# Patient Record
Sex: Male | Born: 1955 | Race: White | Hispanic: No | Marital: Single
Health system: Southern US, Community
[De-identification: ages and names within clinical notes are randomized; demographics above are authoritative.]

## PROBLEM LIST (undated history)

## (undated) DIAGNOSIS — R0683 Snoring: Secondary | ICD-10-CM

## (undated) DIAGNOSIS — K219 Gastro-esophageal reflux disease without esophagitis: Secondary | ICD-10-CM

## (undated) DIAGNOSIS — R9430 Abnormal result of cardiovascular function study, unspecified: Secondary | ICD-10-CM

## (undated) DIAGNOSIS — I5189 Other ill-defined heart diseases: Secondary | ICD-10-CM

## (undated) DIAGNOSIS — I119 Hypertensive heart disease without heart failure: Secondary | ICD-10-CM

## (undated) DIAGNOSIS — R0600 Dyspnea, unspecified: Secondary | ICD-10-CM

## (undated) DIAGNOSIS — M1990 Unspecified osteoarthritis, unspecified site: Secondary | ICD-10-CM

## (undated) DIAGNOSIS — Z9289 Personal history of other medical treatment: Secondary | ICD-10-CM

## (undated) DIAGNOSIS — F411 Generalized anxiety disorder: Secondary | ICD-10-CM

## (undated) DIAGNOSIS — T7840XA Allergy, unspecified, initial encounter: Secondary | ICD-10-CM

## (undated) DIAGNOSIS — I10 Essential (primary) hypertension: Secondary | ICD-10-CM

## (undated) DIAGNOSIS — H8110 Benign paroxysmal vertigo, unspecified ear: Secondary | ICD-10-CM

## (undated) HISTORY — DX: Snoring: R06.83

## (undated) HISTORY — DX: Benign paroxysmal vertigo, unspecified ear: H81.10

## (undated) HISTORY — DX: Dyspnea, unspecified: R06.00

## (undated) HISTORY — DX: Abnormal result of cardiovascular function study, unspecified: R94.30

## (undated) HISTORY — DX: Hypertensive heart disease without heart failure: I11.9

## (undated) HISTORY — DX: Personal history of other medical treatment: Z92.89

## (undated) HISTORY — DX: Essential (primary) hypertension: I10

## (undated) HISTORY — DX: Unspecified osteoarthritis, unspecified site: M19.90

## (undated) HISTORY — DX: Allergy, unspecified, initial encounter: T78.40XA

## (undated) HISTORY — DX: Gastro-esophageal reflux disease without esophagitis: K21.9

## (undated) HISTORY — DX: Generalized anxiety disorder: F41.1

## (undated) HISTORY — DX: Other ill-defined heart diseases: I51.89

---

## 2005-03-14 ENCOUNTER — Inpatient Hospital Stay (HOSPITAL_COMMUNITY): Admission: AD | Admit: 2005-03-14 | Discharge: 2005-03-17 | Payer: Self-pay | Admitting: Internal Medicine

## 2005-03-16 ENCOUNTER — Encounter (INDEPENDENT_AMBULATORY_CARE_PROVIDER_SITE_OTHER): Payer: Self-pay | Admitting: *Deleted

## 2005-05-22 ENCOUNTER — Ambulatory Visit (HOSPITAL_COMMUNITY): Admission: RE | Admit: 2005-05-22 | Discharge: 2005-05-22 | Payer: Self-pay | Admitting: *Deleted

## 2007-04-10 ENCOUNTER — Encounter (HOSPITAL_BASED_OUTPATIENT_CLINIC_OR_DEPARTMENT_OTHER): Admission: RE | Admit: 2007-04-10 | Discharge: 2007-04-16 | Payer: Self-pay | Admitting: Surgery
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
## 2011-03-02 NOTE — Op Note (Signed)
[name], [name] NO.:  [id number]   MEDICAL RECORD NO.:  [id number]          PATIENT TYPE:  INP   LOCATION:  5733                         FACILITY:  [facility]   PHYSICIAN:  Georgiana Spinner, M.D.    DATE OF BIRTH:  [birth year]   DATE OF PROCEDURE:  03/16/2005  DATE OF DISCHARGE:                                 OPERATIVE REPORT   PROCEDURE:  Upper endoscopy with biopsy.   INDICATIONS FOR PROCEDURE:  GI bleed.   ANESTHESIA:  Demerol 60, Versed 6 mg.   PROCEDURE:  With the patient mildly sedated in the left lateral decubitus  position, the Olympus videoscopic endoscope was inserted in the mouth and  passed under direct vision through the esophagus which normal until we  reached the distal esophagus and there was the question of a small ulcer and  esophagitis photographed and biopsied.  We entered into the stomach.  The  fundus, body, antrum were visualized.  We then entered into the duodenal  bulb and second portion of the duodenum which all appeared normal.  From  this point, the endoscope was slowly withdrawn taking circumferential views  of the duodenal mucosa until the endoscope was pulled back into the stomach  and placed in retroflexion to view the stomach from below.  The endoscope  was then straightened and withdrawn, taking circumferential views of the  remaining gastric and esophageal mucosa.  The patient's vital signs and  pulse oximeter remained stable.  The patient tolerated the procedure well  without apparent complications.   FINDINGS:  Unremarkable examination other than some possible mild gastritis  which was biopsied and the mild esophagitis which was biopsied.   PLAN:  Await biopsy report, the patient will call me for results and follow  up with me as an outpatient.      GMO/MEDQ  D:  03/16/2005  T:  03/16/2005  Job:  [id number]
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
## 2013-03-17 ENCOUNTER — Encounter: Payer: Self-pay | Admitting: Internal Medicine

## 2013-03-17 ENCOUNTER — Other Ambulatory Visit (INDEPENDENT_AMBULATORY_CARE_PROVIDER_SITE_OTHER): Payer: BC Managed Care – PPO

## 2013-03-17 ENCOUNTER — Ambulatory Visit (INDEPENDENT_AMBULATORY_CARE_PROVIDER_SITE_OTHER): Payer: BC Managed Care – PPO | Admitting: Internal Medicine

## 2013-03-17 VITALS — BP 140/80 | HR 73 | Temp 98.5°F | Resp 16 | Ht 67.5 in | Wt 186.0 lb

## 2013-03-17 DIAGNOSIS — Z0000 Encounter for general adult medical examination without abnormal findings: Secondary | ICD-10-CM

## 2013-03-17 DIAGNOSIS — R05 Cough: Secondary | ICD-10-CM

## 2013-03-17 DIAGNOSIS — J42 Unspecified chronic bronchitis: Secondary | ICD-10-CM

## 2013-03-17 DIAGNOSIS — Z136 Encounter for screening for cardiovascular disorders: Secondary | ICD-10-CM

## 2013-03-17 DIAGNOSIS — F411 Generalized anxiety disorder: Secondary | ICD-10-CM

## 2013-03-17 DIAGNOSIS — I10 Essential (primary) hypertension: Secondary | ICD-10-CM

## 2013-03-17 DIAGNOSIS — R0683 Snoring: Secondary | ICD-10-CM

## 2013-03-17 DIAGNOSIS — R0609 Other forms of dyspnea: Secondary | ICD-10-CM

## 2013-03-17 HISTORY — DX: Essential (primary) hypertension: I10

## 2013-03-17 HISTORY — DX: Generalized anxiety disorder: F41.1

## 2013-03-17 HISTORY — DX: Snoring: R06.83

## 2013-03-17 LAB — HEPATITIS C ANTIBODY: HCV Ab: NEGATIVE

## 2013-03-17 LAB — CBC WITH DIFFERENTIAL/PLATELET
Basophils Absolute: 0.1 10*3/uL (ref 0.0–0.1)
Basophils Relative: 1.6 % (ref 0.0–3.0)
Hemoglobin: 13.2 g/dL (ref 13.0–17.0)
Lymphs Abs: 2 10*3/uL (ref 0.7–4.0)
MCV: 97.4 fl (ref 78.0–100.0)
Monocytes Absolute: 0.6 10*3/uL (ref 0.1–1.0)
Neutro Abs: 5 10*3/uL (ref 1.4–7.7)
RBC: 4.02 Mil/uL — ABNORMAL LOW (ref 4.22–5.81)

## 2013-03-17 LAB — URINALYSIS, ROUTINE W REFLEX MICROSCOPIC
Bilirubin Urine: NEGATIVE
Leukocytes, UA: NEGATIVE
Nitrite: NEGATIVE
Specific Gravity, Urine: 1.02 (ref 1.000–1.030)
Urobilinogen, UA: 0.2 (ref 0.0–1.0)
pH: 5.5 (ref 5.0–8.0)

## 2013-03-17 LAB — COMPREHENSIVE METABOLIC PANEL
ALT: 9 U/L (ref 0–53)
Albumin: 3.7 g/dL (ref 3.5–5.2)
BUN: 20 mg/dL (ref 6–23)
GFR: 47.26 mL/min — ABNORMAL LOW (ref >60.00)
Glucose, Bld: 94 mg/dL (ref 70–99)

## 2013-03-17 LAB — LIPID PANEL
LDL Cholesterol: 121 mg/dL — ABNORMAL HIGH (ref 0–99)
Triglycerides: 140 mg/dL (ref 0.0–149.0)

## 2013-03-17 MED ORDER — OLMESARTAN MEDOXOMIL-HCTZ 20-12.5 MG PO TABS: 1.0000 | ORAL_TABLET | Freq: Every day | ORAL | Status: DC

## 2013-03-17 MED ORDER — ALPRAZOLAM 0.5 MG PO TABS: 0.5000 mg | ORAL_TABLET | Freq: Two times a day (BID) | ORAL | Status: DC | PRN

## 2013-03-17 NOTE — Assessment & Plan Note (Signed)
Stop the ACEI Start Benicar-HCT I will check his labs to look for end organ damage and secondary causes of HTN

## 2013-03-17 NOTE — Assessment & Plan Note (Signed)
Exam done, EKG shows RBBB (not significant)  Vaccines were updated Labs ordered He was referred for a colonoscopy Pt ed material was given

## 2013-03-17 NOTE — Assessment & Plan Note (Signed)
Cont xanax as needed 

## 2013-03-17 NOTE — Progress Notes (Signed)
Subjective:    Patient ID: [name], male    DOB: [birth year], 57 y.o.   MRN: [id number]  Cough This is a chronic problem. The current episode started more than 1 year ago. The problem has been unchanged. The problem occurs constantly. The cough is productive of sputum (white scant phlegm). Associated symptoms include shortness of breath and wheezing. Pertinent negatives include no chest pain, chills, ear congestion, ear pain, fever, headaches, heartburn, hemoptysis, myalgias, nasal congestion, postnasal drip, rash, rhinorrhea, sore throat, sweats or weight loss. Nothing aggravates the symptoms. He has tried nothing for the symptoms.      Review of Systems  Constitutional: Negative.  Negative for fever, chills, weight loss, diaphoresis, activity change, appetite change, fatigue and unexpected weight change.  HENT: Negative.  Negative for ear pain, sore throat, rhinorrhea and postnasal drip.   Eyes: Negative.   Respiratory: Positive for cough, shortness of breath and wheezing. Negative for apnea, hemoptysis, choking, chest tightness and stridor.        "heavy snoring"  Cardiovascular: Negative.  Negative for chest pain, palpitations and leg swelling.  Gastrointestinal: Negative.  Negative for heartburn, nausea, vomiting, abdominal pain, diarrhea, constipation, abdominal distention, anal bleeding and rectal pain.  Endocrine: Negative.   Genitourinary: Negative.   Musculoskeletal: Negative.  Negative for myalgias.  Skin: Negative.  Negative for color change, pallor, rash and wound.  Allergic/Immunologic: Negative.   Neurological: Negative.  Negative for dizziness, weakness, light-headedness and headaches.  Hematological: Negative.  Negative for adenopathy. Does not bruise/bleed easily.  Psychiatric/Behavioral: Positive for sleep disturbance. Negative for suicidal ideas and self-injury. The patient is nervous/anxious.        Objective:   Physical Exam  Vitals reviewed. Constitutional:  He is oriented to person, place, and time. He appears well-developed and well-nourished. No distress.  HENT:  Head: Normocephalic and atraumatic.  Mouth/Throat: Oropharynx is clear and moist. No oropharyngeal exudate.  Eyes: Conjunctivae are normal. Right eye exhibits no discharge. Left eye exhibits no discharge. No scleral icterus.  Neck: Normal range of motion. Neck supple. No JVD present. No tracheal deviation present. No thyromegaly present.  Cardiovascular: Normal rate, regular rhythm, normal heart sounds and intact distal pulses.  Exam reveals no gallop and no friction rub.   No murmur heard. Pulmonary/Chest: Effort normal and breath sounds normal. No stridor. No respiratory distress. He has no wheezes. He has no rales. He exhibits no tenderness.  Abdominal: Soft. Bowel sounds are normal. He exhibits no distension and no mass. There is no tenderness. There is no rebound and no guarding. Hernia confirmed negative in the right inguinal area and confirmed negative in the left inguinal area.  Genitourinary: Rectum normal, prostate normal, testes normal and penis normal. Rectal exam shows no external hemorrhoid, no internal hemorrhoid, no fissure, no mass, no tenderness and anal tone normal. Guaiac negative stool. Prostate is not enlarged and not tender. Right testis shows no mass, no swelling and no tenderness. Right testis is descended. Left testis shows no mass, no swelling and no tenderness. Left testis is descended. Uncircumcised. No phimosis, paraphimosis, hypospadias, penile erythema or penile tenderness. No discharge found.  Musculoskeletal: Normal range of motion. He exhibits no edema and no tenderness.  Lymphadenopathy:    He has no cervical adenopathy.       Right: No inguinal adenopathy present.       Left: No inguinal adenopathy present.  Neurological: He is oriented to person, place, and time.  Skin: Skin is warm and dry. No rash noted.  He is not diaphoretic. No erythema. No pallor.   Psychiatric: He has a normal mood and affect. His behavior is normal. Judgment and thought content normal.     No results found for this basename: WBC, HGB, HCT, PLT, GLUCOSE, CHOL, TRIG, HDL, LDLDIRECT, LDLCALC, ALT, AST, NA, K, CL, CREATININE, BUN, CO2, TSH, PSA, INR, GLUF, HGBA1C, MICROALBUR       Assessment & Plan:

## 2013-03-17 NOTE — Patient Instructions (Signed)
Cough, Adult  A cough is a reflex that helps clear your throat and airways. It can help heal the body or may be a reaction to an irritated airway. A cough may only last 2 or 3 weeks (acute) or may last more than 8 weeks (chronic).  CAUSES Acute cough:  Viral or bacterial infections. Chronic cough:  Infections.  Allergies.  Asthma.  Post-nasal drip.  Smoking.  Heartburn or acid reflux.  Some medicines.  Chronic lung problems (COPD).  Cancer. SYMPTOMS   Cough.  Fever.  Chest pain.  Increased breathing rate.  High-pitched whistling sound when breathing (wheezing).  Colored mucus that you cough up (sputum). TREATMENT   A bacterial cough may be treated with antibiotic medicine.  A viral cough must run its course and will not respond to antibiotics.  Your caregiver may recommend other treatments if you have a chronic cough. HOME CARE INSTRUCTIONS   Only take over-the-counter or prescription medicines for pain, discomfort, or fever as directed by your caregiver. Use cough suppressants only as directed by your caregiver.  Use a cold steam vaporizer or humidifier in your bedroom or home to help loosen secretions.  Sleep in a semi-upright position if your cough is worse at night.  Rest as needed.  Stop smoking if you smoke. SEEK IMMEDIATE MEDICAL CARE IF:   You have pus in your sputum.  Your cough starts to worsen.  You cannot control your cough with suppressants and are losing sleep.  You begin coughing up blood.  You have difficulty breathing.  You develop pain which is getting worse or is uncontrolled with medicine.  You have a fever. MAKE SURE YOU:   Understand these instructions.  Will watch your condition.  Will get help right away if you are not doing well or get worse. Document Released: 03/30/2011 Document Revised: 12/24/2011 Document Reviewed: 03/30/2011 [facility] Patient Information 2014 [place], [place]. Health Maintenance, Males A  healthy lifestyle and preventative care can promote health and wellness.  Maintain regular health, dental, and eye exams.  Eat a healthy diet. Foods like vegetables, fruits, whole grains, low-fat dairy products, and lean protein foods contain the nutrients you need without too many calories. Decrease your intake of foods high in solid fats, added sugars, and salt. Get information about a proper diet from your caregiver, if necessary.  Regular physical exercise is one of the most important things you can do for your health. Most adults should get at least 150 minutes of moderate-intensity exercise (any activity that increases your heart rate and causes you to sweat) each week. In addition, most adults need muscle-strengthening exercises on 2 or more days a week.   Maintain a healthy weight. The body mass index (BMI) is a screening tool to identify possible weight problems. It provides an estimate of body fat based on height and weight. Your caregiver can help determine your BMI, and can help you achieve or maintain a healthy weight. For adults 20 years and older:  A BMI below 18.5 is considered underweight.  A BMI of 18.5 to 24.9 is normal.  A BMI of 25 to 29.9 is considered overweight.  A BMI of 30 and above is considered obese.  Maintain normal blood lipids and cholesterol by exercising and minimizing your intake of saturated fat. Eat a balanced diet with plenty of fruits and vegetables. Blood tests for lipids and cholesterol should begin at age 51 and be repeated every 5 years. If your lipid or cholesterol levels are high, you are  over 50, or you are a high risk for heart disease, you may need your cholesterol levels checked more frequently.Ongoing high lipid and cholesterol levels should be treated with medicines, if diet and exercise are not effective.  If you smoke, find out from your caregiver how to quit. If you do not use tobacco, do not start.  If you choose to drink alcohol, do not  exceed 2 drinks per day. One drink is considered to be 12 ounces (355 mL) of beer, 5 ounces (148 mL) of wine, or 1.5 ounces (44 mL) of liquor.  Avoid use of street drugs. Do not share needles with anyone. Ask for help if you need support or instructions about stopping the use of drugs.  High blood pressure causes heart disease and increases the risk of stroke. Blood pressure should be checked at least every 1 to 2 years. Ongoing high blood pressure should be treated with medicines if weight loss and exercise are not effective.  If you are 29 to 57 years old, ask your caregiver if you should take aspirin to prevent heart disease.  Diabetes screening involves taking a blood sample to check your fasting blood sugar level. This should be done once every 3 years, after age 47, if you are within normal weight and without risk factors for diabetes. Testing should be considered at a younger age or be carried out more frequently if you are overweight and have at least 1 risk factor for diabetes.  Colorectal cancer can be detected and often prevented. Most routine colorectal cancer screening begins at the age of 66 and continues through age 53. However, your caregiver may recommend screening at an earlier age if you have risk factors for colon cancer. On a yearly basis, your caregiver may provide home test kits to check for hidden blood in the stool. Use of a small camera at the end of a tube, to directly examine the colon (sigmoidoscopy or colonoscopy), can detect the earliest forms of colorectal cancer. Talk to your caregiver about this at age 9, when routine screening begins. Direct examination of the colon should be repeated every 5 to 10 years through age 52, unless early forms of pre-cancerous polyps or small growths are found.  Hepatitis C blood testing is recommended for all people born from [birth year] through 1965 and any individual with known risks for hepatitis C.  Healthy men should no longer receive  prostate-specific antigen (PSA) blood tests as part of routine cancer screening. Consult with your caregiver about prostate cancer screening.  Testicular cancer screening is not recommended for adolescents or adult males who have no symptoms. Screening includes self-exam, caregiver exam, and other screening tests. Consult with your caregiver about any symptoms you have or any concerns you have about testicular cancer.  Practice safe sex. Use condoms and avoid high-risk sexual practices to reduce the spread of sexually transmitted infections (STIs).  Use sunscreen with a sun protection factor (SPF) of 30 or greater. Apply sunscreen liberally and repeatedly throughout the day. You should seek shade when your shadow is shorter than you. Protect yourself by wearing long sleeves, pants, a wide-brimmed hat, and sunglasses year round, whenever you are outdoors.  Notify your caregiver of new moles or changes in moles, especially if there is a change in shape or color. Also notify your caregiver if a mole is larger than the size of a pencil eraser.  A one-time screening for abdominal aortic aneurysm (AAA) and surgical repair of large AAAs by sound wave  imaging (ultrasonography) is recommended for ages 32 to 90 years who are current or former smokers.  Stay current with your immunizations. Document Released: 03/29/2008 Document Revised: 12/24/2011 Document Reviewed: 02/26/2011 [facility] Patient Information 2014 [place], [place].

## 2013-03-17 NOTE — Assessment & Plan Note (Signed)
I asked him to stop smoking He will see pulm to consider getting PFT's done

## 2013-03-17 NOTE — Assessment & Plan Note (Signed)
Stop the ACEI Check a CXR pulm referral

## 2013-03-17 NOTE — Assessment & Plan Note (Signed)
pulm referral

## 2013-03-18 ENCOUNTER — Encounter: Payer: Self-pay | Admitting: Internal Medicine

## 2013-03-18 ENCOUNTER — Ambulatory Visit (INDEPENDENT_AMBULATORY_CARE_PROVIDER_SITE_OTHER)
Admission: RE | Admit: 2013-03-18 | Discharge: 2013-03-18 | Disposition: A | Discharge status: Home / Self Care | Payer: BC Managed Care – PPO | Source: Ambulatory Visit | Attending: Internal Medicine | Admitting: Internal Medicine

## 2013-03-18 ENCOUNTER — Ambulatory Visit (INDEPENDENT_AMBULATORY_CARE_PROVIDER_SITE_OTHER): Payer: BC Managed Care – PPO | Admitting: Internal Medicine

## 2013-03-18 VITALS — BP 130/78 | HR 77 | Temp 98.0°F | Ht 64.25 in | Wt 187.0 lb

## 2013-03-18 DIAGNOSIS — R05 Cough: Secondary | ICD-10-CM

## 2013-03-18 DIAGNOSIS — F17200 Nicotine dependence, unspecified, uncomplicated: Secondary | ICD-10-CM | POA: Insufficient documentation

## 2013-03-18 MED ORDER — TRAMADOL HCL 50 MG PO TABS: ORAL_TABLET | ORAL | Status: DC

## 2013-03-18 NOTE — Progress Notes (Signed)
  Subjective:    Patient ID: [name], male    DOB: 09/05/1956   MRN: [id number]  HPI  68 yowm smoker with cough due to tickle in throat x 2011 referred 03/18/2013 to pulmonary clinic for eval of chronic cough / sob.  03/18/2013 1st pulmonary eval cc sev years indolent onset persistent daily x several years not really getting any worse sp discontinuation of acei 03/17/13 by Dr Yetta Barre. Assoc with mild sense of  Doe/ pnds/nasal congestion/dysphagia.  No obvious daytime variabilty or assoc   cp or chest tightness, subjective wheeze overt sinus or hb symptoms. No unusual exp hx or h/o childhood pna/ asthma or premature birth to his knowledge.   Sleeping ok without nocturnal  or early am exacerbation  of respiratory  c/o's or need for noct saba. Also denies any obvious fluctuation of symptoms with weather or environmental changes or other aggravating or alleviating factors except as outlined above    Review of Systems  Constitutional: Negative for fever, chills, activity change, appetite change and unexpected weight change.  HENT: Negative for congestion, sore throat, rhinorrhea, sneezing, trouble swallowing, dental problem, voice change and postnasal drip.   Eyes: Negative for visual disturbance.  Respiratory: Positive for cough and shortness of breath. Negative for choking.   Cardiovascular: Negative for chest pain and leg swelling.  Gastrointestinal: Negative for nausea, vomiting and abdominal pain.  Genitourinary: Negative for difficulty urinating.       Acid Heartburn  Musculoskeletal: Positive for arthralgias.  Skin: Negative for rash.  Psychiatric/Behavioral: Negative for behavioral problems and confusion.       Objective:   Physical Exam  Wt Readings from Last 3 Encounters:  03/18/13 187 lb (84.823 kg)  03/17/13 186 lb (84.369 kg)    amb wm with mod hoarseness, mild pseudowheeze nad with nl vital signs HEENT mild turbinate edema.  Oropharynx no thrush or excess pnd or  cobblestoning.  No JVD or cervical adenopathy. Mild accessory muscle hypertrophy. Trachea midline, nl thryroid. Chest was ,min hyperinflated by percussion with slt diminished breath sounds and min increased exp time without wheeze. Hoover sign positive at late  inspiration. Regular rate and rhythm without murmur gallop or rub or increase P2 or edema.  Abd: no hsm, nl excursion. Ext warm without cyanosis or clubbing.      CXR  03/18/2013 :   No acute findings.      Assessment & Plan:

## 2013-03-18 NOTE — Assessment & Plan Note (Signed)
I took an extended  opportunity with this patient to outline the consequences of continued cigarette use  in airway disorders based on all the data we have from the multiple national lung health studies (perfomed over decades at millions of dollars in cost)  indicating that smoking cessation, not choice of inhalers or physicians, is the most important aspect of care.    Needs baseline pfts rec on f/u in 6 weeks to regroup re ? copd

## 2013-03-18 NOTE — Patient Instructions (Addendum)
Please remember to go to the x-ray department downstairs for your tests - we will call you with the results when they are available.  GERD (REFLUX)  is an extremely common cause of respiratory symptoms, many times with no significant heartburn at all.    It can be treated with medication, but also with lifestyle changes including avoidance of late meals, excessive alcohol, smoking cessation, and avoid fatty foods, chocolate, peppermint, colas, red wine, and acidic juices such as orange juice.  NO MINT OR MENTHOL PRODUCTS SO NO COUGH DROPS  USE SUGARLESS CANDY INSTEAD (jolley ranchers or Stover's)  NO OIL BASED VITAMINS - use powdered substitutes.  For cough delsym 2tsp every 12 hours supplement with tramadol 50 mg up to 2 every 4 hours but may make you sleepy - you should gradually need less cough suppression over the next several weeks as the lisinopril washes out  Please schedule a follow up office visit in 6 weeks, call sooner if needed with pfts on return

## 2013-03-18 NOTE — Assessment & Plan Note (Signed)
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   These are the same pts (now being labeled as having "irritable larynx syndrome" by some cough centers) who not infrequently have a history of having failed to tolerate ace inhibitors,  dry powder inhalers or biphosphonates or report having atypical reflux symptoms that don't respond to standard doses of PPI , and are easily confused as having aecopd or asthma flares by even experienced allergists/ pulmonologists.   For now rec continue rx for gerd off acei and return for pft's

## 2013-03-19 ENCOUNTER — Telehealth: Payer: Self-pay | Admitting: *Deleted

## 2013-03-19 NOTE — Telephone Encounter (Signed)
Left msg on triage wanting to verify what type of paper our prescription are printed on. Received a script for alprazolam.../lmb

## 2013-03-19 NOTE — Telephone Encounter (Signed)
Called [facility] back spoke with pharmacist Sameer he stated there wasn't any note there for pt. Not sure who called...[name]

## 2013-04-29 ENCOUNTER — Encounter: Payer: Self-pay | Admitting: Internal Medicine

## 2013-04-29 ENCOUNTER — Ambulatory Visit (INDEPENDENT_AMBULATORY_CARE_PROVIDER_SITE_OTHER): Payer: BC Managed Care – PPO | Admitting: Internal Medicine

## 2013-04-29 VITALS — BP 120/70 | HR 71 | Temp 98.1°F | Ht 66.0 in | Wt 189.0 lb

## 2013-04-29 DIAGNOSIS — I10 Essential (primary) hypertension: Secondary | ICD-10-CM

## 2013-04-29 DIAGNOSIS — F17200 Nicotine dependence, unspecified, uncomplicated: Secondary | ICD-10-CM

## 2013-04-29 DIAGNOSIS — R05 Cough: Secondary | ICD-10-CM

## 2013-04-29 LAB — PULMONARY FUNCTION TEST

## 2013-04-29 MED ORDER — OLMESARTAN MEDOXOMIL-HCTZ 20-12.5 MG PO TABS: ORAL_TABLET | ORAL | Status: DC

## 2013-04-29 NOTE — Patient Instructions (Addendum)
Continue benicar 20/12.5 one daily Continue prilosec 40 mg Take 30- 60 min before your first and last meals of the day  Take one more round of prednisone should clear the inflammation from the cough itself  GERD (REFLUX)  is an extremely common cause of respiratory symptoms, many times with no significant heartburn at all.    It can be treated with medication, but also with lifestyle changes including avoidance of late meals, excessive alcohol, smoking cessation, and avoid fatty foods, chocolate, peppermint, colas, red wine, and acidic juices such as orange juice.  NO MINT OR MENTHOL PRODUCTS SO NO COUGH DROPS  USE SUGARLESS CANDY INSTEAD (jolley ranchers or Stover's)  NO OIL BASED VITAMINS - use powdered substitutes.     If you are satisfied with your treatment plan let your doctor know and he/she can either refill your medications or you can return here when your prescription runs out.     If in any way you are not 100% satisfied,  please tell us.  If 100% better, tell your friends!

## 2013-04-29 NOTE — Progress Notes (Signed)
  Subjective:    Patient ID: [name], male    DOB: [birth year]   MRN: [id number]  Brief patient profile:  39 yowm smoker with cough due to tickle in throat x 2011 referred 03/18/2013 to pulmonary clinic for eval of chronic cough / sob.  03/18/2013 1st pulmonary eval cc sev years indolent onset persistent daily cough x several years not really getting any worse over time,  sp discontinuation of acei 03/17/13 by Dr Yetta Barre and no better yet. Assoc with mild sense of  Doe/ pnds/nasal congestion/dysphagia.  rec  GERD diet  For cough delsym 2tsp every 12 hours supplement with tramadol 50 mg up to 2 every 4 hours but may make you sleepy - you should gradually need less cough suppression over the next several weeks as the lisinopril washes out  04/29/2013 f/u ov/[name]  Chief Complaint  Patient presents with  . Follow-up with PFT    Pt states cough has improved some since his last visit. He c/o sneezing and lack of smell and taste for the past wk.     No obvious daytime variabilty or assoc sob   cp or chest tightness, subjective wheeze overt  hb symptoms. No unusual exp hx or h/o childhood pna/ asthma or premature birth to his knowledge.   Sleeping ok without nocturnal  or early am exacerbation  of respiratory  c/o's or need for noct saba. Also denies any obvious fluctuation of symptoms with weather or environmental changes or other aggravating or alleviating factors except as outlined above   Current Medications, Allergies, Past Medical History, Past Surgical History, Family History, and Social History were reviewed in [employer] record.  ROS  The following are not active complaints unless bolded sore throat, dysphagia, dental problems, itching, sneezing,  nasal congestion or excess/ purulent secretions, ear ache,   fever, chills, sweats, unintended wt loss, pleuritic or exertional cp, hemoptysis,  orthopnea pnd or leg swelling, presyncope, palpitations, heartburn, abdominal pain,  anorexia, nausea, vomiting, diarrhea  or change in bowel or urinary habits, change in stools or urine, dysuria,hematuria,  rash, arthralgias, visual complaints, headache, numbness weakness or ataxia or problems with walking or coordination,  change in mood/affect or memory.              Objective:   Physical Exam Wt Readings from Last 3 Encounters:  04/29/13 189 lb (85.73 kg)  03/18/13 187 lb (84.823 kg)  03/17/13 186 lb (84.369 kg)      amb wm with mild hoarseness,  no pseudowheeze nad with nl vital signs HEENT mild turbinate edema.  Oropharynx no thrush or excess pnd or cobblestoning.  No JVD or cervical adenopathy. Mild accessory muscle hypertrophy. Trachea midline, nl thryroid. Chest was ,min hyperinflated by percussion with slt diminished breath sounds and min increased exp time without wheeze. Hoover sign positive at late  inspiration. Regular rate and rhythm without murmur gallop or rub or increase P2 or edema.  Abd: no hsm, nl excursion. Ext warm without cyanosis or clubbing.      CXR  03/18/2013 :   No acute findings.      Assessment & Plan:

## 2013-04-29 NOTE — Progress Notes (Signed)
PFT done today. 

## 2013-05-01 NOTE — Assessment & Plan Note (Signed)
He does not meet the criteria for copd yet  I reviewed the Flethcher curve with patient that basically indicates  if you quit smoking when your best day FEV1 is still well preserved (which his is) it is highly unlikely you will progress to severe disease and informed the patient there was no medication on the market that has proven to change the curve or the likelihood of progression.  Therefore stopping smoking and maintaining abstinence is the most important aspect of care, not choice of inhalers or for that matter, doctors.

## 2013-07-03 ENCOUNTER — Telehealth: Payer: Self-pay

## 2013-07-03 DIAGNOSIS — I10 Essential (primary) hypertension: Secondary | ICD-10-CM

## 2013-07-03 MED ORDER — TELMISARTAN-HCTZ 40-12.5 MG PO TABS: 1.0000 | ORAL_TABLET | Freq: Every day | ORAL | Status: DC

## 2013-07-03 NOTE — Telephone Encounter (Signed)
done

## 2013-07-03 NOTE — Telephone Encounter (Signed)
Received fax from [employer] advising Benicar HCT is denied. Pt must have tried or failed candesartan, irbesartan/hctz, valsartan htca, losartan hctz, telmisartan hctz or  Eprosartan hctz. Thanks

## 2013-08-20 ENCOUNTER — Other Ambulatory Visit: Payer: Self-pay

## 2013-09-15 ENCOUNTER — Other Ambulatory Visit (INDEPENDENT_AMBULATORY_CARE_PROVIDER_SITE_OTHER): Payer: BC Managed Care – PPO

## 2013-09-15 ENCOUNTER — Ambulatory Visit (INDEPENDENT_AMBULATORY_CARE_PROVIDER_SITE_OTHER): Payer: BC Managed Care – PPO | Admitting: Internal Medicine

## 2013-09-15 ENCOUNTER — Telehealth: Payer: Self-pay

## 2013-09-15 ENCOUNTER — Encounter: Payer: Self-pay | Admitting: Internal Medicine

## 2013-09-15 VITALS — BP 138/82 | HR 72 | Temp 97.8°F | Resp 16 | Ht 67.0 in | Wt 205.0 lb

## 2013-09-15 DIAGNOSIS — R0989 Other specified symptoms and signs involving the circulatory and respiratory systems: Secondary | ICD-10-CM

## 2013-09-15 DIAGNOSIS — I10 Essential (primary) hypertension: Secondary | ICD-10-CM

## 2013-09-15 DIAGNOSIS — R0600 Dyspnea, unspecified: Secondary | ICD-10-CM

## 2013-09-15 DIAGNOSIS — R9431 Abnormal electrocardiogram [ECG] [EKG]: Secondary | ICD-10-CM

## 2013-09-15 DIAGNOSIS — R0609 Other forms of dyspnea: Secondary | ICD-10-CM

## 2013-09-15 HISTORY — DX: Dyspnea, unspecified: R06.00

## 2013-09-15 LAB — COMPREHENSIVE METABOLIC PANEL
ALT: 11 U/L (ref 0–53)
Alkaline Phosphatase: 83 U/L (ref 39–117)
Calcium: 9.2 mg/dL (ref 8.4–10.5)
Creatinine, Ser: 1.6 mg/dL — ABNORMAL HIGH (ref 0.4–1.5)
GFR: 47.86 mL/min — ABNORMAL LOW (ref >60.00)
Sodium: 136 mEq/L (ref 135–145)

## 2013-09-15 LAB — CBC WITH DIFFERENTIAL/PLATELET
Basophils Absolute: 0.1 10*3/uL (ref 0.0–0.1)
Basophils Relative: 1.9 % (ref 0.0–3.0)
HCT: 38.8 % — ABNORMAL LOW (ref 39.0–52.0)
Lymphs Abs: 1.8 10*3/uL (ref 0.7–4.0)
MCHC: 34.1 g/dL (ref 30.0–36.0)
MCV: 94.3 fl (ref 78.0–100.0)
Monocytes Absolute: 0.7 10*3/uL (ref 0.1–1.0)
Neutro Abs: 2.2 10*3/uL (ref 1.4–7.7)
RBC: 4.11 Mil/uL — ABNORMAL LOW (ref 4.22–5.81)
WBC: 5.6 10*3/uL (ref 4.5–10.5)

## 2013-09-15 LAB — URINALYSIS, ROUTINE W REFLEX MICROSCOPIC
Nitrite: NEGATIVE
Specific Gravity, Urine: 1.02 (ref 1.000–1.030)
Total Protein, Urine: NEGATIVE

## 2013-09-15 LAB — TSH: TSH: 1.72 u[IU]/mL (ref 0.35–5.50)

## 2013-09-15 LAB — BRAIN NATRIURETIC PEPTIDE: Pro B Natriuretic peptide (BNP): 37 pg/mL (ref 0.0–100.0)

## 2013-09-15 NOTE — Patient Instructions (Signed)

## 2013-09-15 NOTE — Telephone Encounter (Signed)
Call from [facility] reporting a  D-dimer at 0.28. The report has been given to Woody at [facility] for follow up.

## 2013-09-15 NOTE — Progress Notes (Signed)
Subjective:    Patient ID: [name], male    DOB: 10-22-1955, 57 y.o.   MRN: [id number]  Shortness of Breath This is a new problem. The current episode started more than 1 month ago. The problem occurs intermittently. The problem has been unchanged. Pertinent negatives include no abdominal pain, chest pain, claudication, coryza, ear pain, fever, headaches, hemoptysis, leg pain, leg swelling, neck pain, orthopnea, PND, rash, rhinorrhea, sore throat, sputum production, swollen glands, syncope, vomiting or wheezing. The symptoms are aggravated by any activity. Associated symptoms comments: DOE when climbing stairs. He has tried nothing for the symptoms. The treatment provided no relief. His past medical history is significant for chronic lung disease and COPD. There is no history of allergies, aspirin allergies, asthma, bronchiolitis, CAD, DVT, a heart failure, PE, pneumonia or a recent surgery.      Review of Systems  Constitutional: Positive for fatigue and unexpected weight change (weight gain). Negative for fever, chills, diaphoresis, activity change and appetite change.  HENT: Negative.  Negative for ear pain, rhinorrhea and sore throat.   Eyes: Negative.   Respiratory: Positive for shortness of breath. Negative for apnea, cough, hemoptysis, sputum production, choking, chest tightness, wheezing and stridor.   Cardiovascular: Negative.  Negative for chest pain, palpitations, orthopnea, claudication, leg swelling, syncope and PND.  Gastrointestinal: Negative.  Negative for nausea, vomiting, abdominal pain, diarrhea and constipation.  Endocrine: Negative.   Genitourinary: Negative.  Negative for urgency, frequency, hematuria, flank pain, decreased urine volume and difficulty urinating.  Musculoskeletal: Negative.  Negative for neck pain.  Skin: Negative.  Negative for rash.  Allergic/Immunologic: Negative.   Neurological: Negative.  Negative for dizziness, tremors, facial asymmetry,  weakness, light-headedness and headaches.  Hematological: Negative.  Negative for adenopathy. Does not bruise/bleed easily.  Psychiatric/Behavioral: Negative.        Objective:   Physical Exam  Vitals reviewed. Constitutional: He is oriented to person, place, and time. He appears well-developed and well-nourished. No distress.  HENT:  Head: Normocephalic and atraumatic.  Mouth/Throat: Oropharynx is clear and moist. No oropharyngeal exudate.  Eyes: Conjunctivae are normal. Right eye exhibits no discharge. Left eye exhibits no discharge. No scleral icterus.  Neck: Normal range of motion. Neck supple. No JVD present. No tracheal deviation present. No thyromegaly present.  Cardiovascular: Normal rate, regular rhythm, S1 normal, S2 normal, normal heart sounds and intact distal pulses.  Exam reveals no gallop and no friction rub.   No murmur heard. Pulses:      Carotid pulses are 1+ on the right side, and 1+ on the left side.      Radial pulses are 1+ on the right side, and 1+ on the left side.       Femoral pulses are 1+ on the right side, and 1+ on the left side.      Popliteal pulses are 1+ on the right side, and 1+ on the left side.       Dorsalis pedis pulses are 1+ on the right side, and 1+ on the left side.       Posterior tibial pulses are 1+ on the right side, and 1+ on the left side.  Pulmonary/Chest: Effort normal and breath sounds normal. No accessory muscle usage or stridor. Not tachypneic. No respiratory distress. He has no decreased breath sounds. He has no wheezes. He has no rhonchi. He has no rales. He exhibits no tenderness.  Abdominal: Soft. Bowel sounds are normal. He exhibits no distension and no mass. There is no  tenderness. There is no rebound and no guarding.  Musculoskeletal: Normal range of motion. He exhibits no edema and no tenderness.  Lymphadenopathy:    He has no cervical adenopathy.  Neurological: He is oriented to person, place, and time.  Skin: Skin is warm  and dry. No rash noted. He is not diaphoretic. No erythema. No pallor.  Psychiatric: He has a normal mood and affect. His behavior is normal. Judgment and thought content normal.     Lab Results  Component Value Date   WBC 8.7 03/17/2013   HGB 13.2 03/17/2013   HCT 39.1 03/17/2013   PLT 296.0 03/17/2013   GLUCOSE 94 03/17/2013   CHOL 179 03/17/2013   TRIG 140.0 03/17/2013   HDL 29.80* 03/17/2013   LDLCALC 121* 03/17/2013   ALT 9 03/17/2013   AST 15 03/17/2013   NA 139 03/17/2013   K 4.5 03/17/2013   CL 110 03/17/2013   CREATININE 1.6* 03/17/2013   BUN 20 03/17/2013   CO2 21 03/17/2013   TSH 0.71 03/17/2013   PSA 0.46 03/17/2013       Assessment & Plan:

## 2013-09-15 NOTE — Assessment & Plan Note (Signed)
His BP is well controlled 

## 2013-09-15 NOTE — Assessment & Plan Note (Signed)
Based in his visits with pulm it does not sound like his lund disease explains his DOE I am concerned about CAD, his EKG today shows RBB which is unchanged from his prior EKG but there are abnormal t waves in V1 and V2 which precludes him from doing an ETT so I have asked him to have an MPI/lexiscan done Labs today so not show any secondary causes for DOE and his cardiac enzymes are negative for ischemia

## 2013-09-15 NOTE — Progress Notes (Signed)
Pre visit review using our clinic review tool, if applicable. No additional management support is needed unless otherwise documented below in the visit note. 

## 2013-09-15 NOTE — Assessment & Plan Note (Signed)
RBBB persists I have asked him to have an MPI done to see if there is ischemia

## 2013-10-01 ENCOUNTER — Encounter: Payer: Self-pay | Admitting: Family Medicine

## 2013-10-01 ENCOUNTER — Ambulatory Visit (INDEPENDENT_AMBULATORY_CARE_PROVIDER_SITE_OTHER): Payer: BC Managed Care – PPO | Admitting: Family Medicine

## 2013-10-01 ENCOUNTER — Telehealth: Payer: Self-pay | Admitting: Internal Medicine

## 2013-10-01 VITALS — BP 162/107 | HR 73 | Temp 99.4°F | Resp 18 | Ht 67.0 in | Wt 203.0 lb

## 2013-10-01 DIAGNOSIS — I10 Essential (primary) hypertension: Secondary | ICD-10-CM

## 2013-10-01 DIAGNOSIS — H8120 Vestibular neuronitis, unspecified ear: Secondary | ICD-10-CM

## 2013-10-01 MED ORDER — PROMETHAZINE HCL 25 MG PO TABS: 25.0000 mg | ORAL_TABLET | Freq: Four times a day (QID) | ORAL | Status: DC | PRN

## 2013-10-01 MED ORDER — PREDNISONE 20 MG PO TABS: ORAL_TABLET | ORAL | Status: DC

## 2013-10-01 NOTE — Progress Notes (Signed)
Pre visit review using our clinic review tool, if applicable. No additional management support is needed unless otherwise documented below in the visit note.  OFFICE NOTE  10/01/2013  CC:  Chief Complaint  Patient presents with  . Dizziness  . Nausea     HPI: Patient is a 57 y.o. Caucasian male who is here for dizziness and nausea. Onset about 36h ago with intermittent light headed feeling while sitting.  Progressed yesterday to vertiginous sensation with upper body or head changes in position and nausea with vomiting.  No abd pain or diarrhea.  Ate today--biscuits, drank ginger ale.  Says urination no different.  When he lies still he feels fine. He has not taken his bp med today.  He has felt feverish during this illness.  No resp illness lately. Had some ringing in ears today, more towards right ear.  No hearing complaints.   Pertinent PMH:  Past Medical History  Diagnosis Date  . GERD (gastroesophageal reflux disease)   . Arthritis   . History of blood transfusion   . Allergy     MEDS:  Outpatient Prescriptions Prior to Visit  Medication Sig Dispense Refill  . ALPRAZolam (XANAX) 0.5 MG tablet Take 1 tablet (0.5 mg total) by mouth 2 (two) times daily as needed for sleep. Take 1/2 to 1 tablets BID  60 tablet  3  . Aspirin-Acetaminophen-Caffeine (EXCEDRIN PO) Take 2 tablets by mouth 4 (four) times daily.      Marland Kitchen omeprazole (PRILOSEC) 40 MG capsule Take 40 mg by mouth 2 (two) times daily.      Marland Kitchen telmisartan-hydrochlorothiazide (MICARDIS HCT) 40-12.5 MG per tablet Take 1 tablet by mouth daily.  90 tablet  1   No facility-administered medications prior to visit.    PE: Blood pressure 162/107, pulse 73, temperature 99.4 F (37.4 C), temperature source Temporal, resp. rate 18, height 5\' 7"  (1.702 m), weight 203 lb (92.08 kg), SpO2 98.00%. Gen: alert, tired appearing, nontoxic.  Oriented x 4. Walks with a veer to the right but is able to stay upright reasonably  well. ZOX:WRUE: no injection, icteris, swelling, or exudate.  EOMI, PERRLA. Mouth: lips without lesion/swelling.  Oral mucosa pink and moist. Oropharynx without erythema, exudate, or swelling.  With gaze to right there is lateral nystagmus with fast phase to the right. With Dix-Halpike maneuvers there is no vertigo, nause, or rotary nystagmus elicited.  However, upon sitting upright after DH maneuver was done with head turned to the left, he once again felt vertigo and had lateral nystagmus with leftward gaze. Head thrust maneuver: from gazing to the L he has corrective saccades when head is thrust in the direction of his gaze.   Remainder of neuro exam normal. CV: RRR, no m/r/g.   LUNGS: CTA bilat, nonlabored resps, good aeration in all lung fields.   IMPRESSION AND PLAN: Vestibular neuronitis:  Prednisone 60mg  qd x 7d, then 40mg  qd x 7d, then 20mg  qd x 7d. Phenergan 25mg  q6h prn. Rest, fluids.  An After Visit Summary was printed and given to the patient.  Spent 30 min with pt today, with >50% of this time spent in counseling and care coordination regarding the above problems.  FOLLOW UP: prn (has appt with Dr. Yetta Barre in 5d already).

## 2013-10-01 NOTE — Telephone Encounter (Signed)
Patient Information:  Caller Name: [name]  Phone: [phone]  Patient: [name], [name]  Gender: Male  DOB: July 13, 1956  Age: 57 Years  PCP: Sanda Linger (Adults only)  Office Follow Up:  Does the office need to follow up with this patient?: No  Instructions For The Office: N/A  RN Note:  Onset of mild dizziness 09/29/13 but dizziness has worsened to where patient states he is very nauseated, has vomited x 3, and is "and not walking straight."  Has had intermittent fever to 101 over past 2 days.  States has had some sinus congestion as well.  Per dizziness protocol, advised office appt today; no appts available per Epic at [facility].  Offered appt in [place] office 10/01/13 1415 with Dr. Milinda Cave; patient will get a ride to [facility] office.  krs/can  Symptoms  Reason For Call & Symptoms: dizziness  Reviewed Health History In EMR: Yes  Reviewed Medications In EMR: Yes  Reviewed Allergies In EMR: Yes  Reviewed Surgeries / Procedures: Yes  Date of Onset of Symptoms: 09/29/2013  Guideline(s) Used:  Dizziness  Disposition Per Guideline:   See Today in Office  Reason For Disposition Reached:   Vomiting occurs with dizziness  Advice Given:  N/A  Patient Will Follow Care Advice:  YES  Appointment Scheduled:  10/01/2013 14:15:00 Appointment Scheduled Provider:  N/A

## 2013-10-06 ENCOUNTER — Encounter: Payer: Self-pay | Admitting: Internal Medicine

## 2013-10-06 ENCOUNTER — Ambulatory Visit (INDEPENDENT_AMBULATORY_CARE_PROVIDER_SITE_OTHER): Payer: BC Managed Care – PPO | Admitting: Internal Medicine

## 2013-10-06 VITALS — BP 160/80 | HR 75 | Temp 97.8°F | Resp 16 | Ht 67.0 in | Wt 208.0 lb

## 2013-10-06 DIAGNOSIS — R519 Headache, unspecified: Secondary | ICD-10-CM | POA: Insufficient documentation

## 2013-10-06 DIAGNOSIS — I10 Essential (primary) hypertension: Secondary | ICD-10-CM

## 2013-10-06 DIAGNOSIS — R51 Headache: Secondary | ICD-10-CM | POA: Insufficient documentation

## 2013-10-06 DIAGNOSIS — H8110 Benign paroxysmal vertigo, unspecified ear: Secondary | ICD-10-CM

## 2013-10-06 DIAGNOSIS — Z23 Encounter for immunization: Secondary | ICD-10-CM

## 2013-10-06 HISTORY — DX: Benign paroxysmal vertigo, unspecified ear: H81.10

## 2013-10-06 MED ORDER — NEBIVOLOL HCL 10 MG PO TABS: 10.0000 mg | ORAL_TABLET | Freq: Every day | ORAL | Status: DC

## 2013-10-06 NOTE — Progress Notes (Signed)
Subjective:    Patient ID: [name], male    DOB: [birth year], 57 y.o.   MRN: [id number]  HPI Comments: He returns for f/up, he was seen a week ago for dizziness and vertigo and is slowly getting better but has a left sided HA and persistent dizziness.  Hypertension This is a chronic problem. The current episode started more than 1 year ago. The problem has been gradually worsening since onset. The problem is uncontrolled. Associated symptoms include anxiety and headaches. Pertinent negatives include no blurred vision, chest pain, malaise/fatigue, neck pain, orthopnea, palpitations, peripheral edema, PND, shortness of breath or sweats. Agents associated with hypertension include steroids (caffeine). Past treatments include angiotensin blockers and diuretics. The current treatment provides moderate improvement. Compliance problems include exercise and diet.       Review of Systems  Constitutional: Negative.  Negative for malaise/fatigue.  Eyes: Negative.  Negative for blurred vision, photophobia, pain, redness and visual disturbance.  Respiratory: Negative.  Negative for apnea, cough, choking, chest tightness, shortness of breath, wheezing and stridor.   Cardiovascular: Negative.  Negative for chest pain, palpitations, orthopnea, leg swelling and PND.  Gastrointestinal: Negative.  Negative for abdominal pain.  Endocrine: Negative.   Genitourinary: Negative.   Musculoskeletal: Negative.  Negative for gait problem and neck pain.  Allergic/Immunologic: Negative.   Neurological: Positive for dizziness and headaches. Negative for tremors, seizures, syncope, facial asymmetry, speech difficulty, weakness, light-headedness and numbness.  Hematological: Negative.  Negative for adenopathy. Does not bruise/bleed easily.  Psychiatric/Behavioral: Negative.        Objective:   Physical Exam  Vitals reviewed. Constitutional: He is oriented to person, place, and time. He appears well-developed and  well-nourished. No distress.  HENT:  Head: Normocephalic and atraumatic.  Right Ear: Hearing, tympanic membrane, external ear and ear canal normal.  Left Ear: Hearing, tympanic membrane, external ear and ear canal normal.  Mouth/Throat: Oropharynx is clear and moist. No oropharyngeal exudate.  Eyes: Conjunctivae and EOM are normal. Pupils are equal, round, and reactive to light. Right eye exhibits no discharge. Left eye exhibits no discharge. No scleral icterus.  Neck: Normal range of motion. Neck supple. No JVD present. No tracheal deviation present. No thyromegaly present.  Cardiovascular: Normal rate, regular rhythm, normal heart sounds and intact distal pulses.  Exam reveals no gallop and no friction rub.   No murmur heard. Pulmonary/Chest: Effort normal and breath sounds normal. No stridor. No respiratory distress. He has no wheezes. He has no rales. He exhibits no tenderness.  Abdominal: Soft. Bowel sounds are normal. He exhibits no distension and no mass. There is no tenderness. There is no rebound and no guarding.  Musculoskeletal: Normal range of motion. He exhibits no edema and no tenderness.  Lymphadenopathy:    He has no cervical adenopathy.  Neurological: He is alert and oriented to person, place, and time. He has normal strength and normal reflexes. He displays no atrophy and no tremor. No cranial nerve deficit or sensory deficit. He exhibits normal muscle tone. He displays a negative Romberg sign. He displays no seizure activity. Coordination and gait normal. He displays no Babinski's sign on the right side. He displays no Babinski's sign on the left side.  Skin: Skin is warm and dry. No rash noted. He is not diaphoretic. No erythema. No pallor.  Psychiatric: He has a normal mood and affect. His behavior is normal. Judgment and thought content normal.   Lab Results  Component Value Date   WBC 5.6 09/15/2013  HGB 13.2 09/15/2013   HCT 38.8* 09/15/2013   PLT 322.0 09/15/2013    GLUCOSE 86 09/15/2013   CHOL 179 03/17/2013   TRIG 140.0 03/17/2013   HDL 29.80* 03/17/2013   LDLCALC 121* 03/17/2013   ALT 11 09/15/2013   AST 14 09/15/2013   NA 136 09/15/2013   K 4.2 09/15/2013   CL 104 09/15/2013   CREATININE 1.6* 09/15/2013   BUN 22 09/15/2013   CO2 27 09/15/2013   TSH 1.72 09/15/2013   PSA 0.46 03/17/2013      Assessment & Plan:

## 2013-10-06 NOTE — Patient Instructions (Signed)

## 2013-10-07 NOTE — Assessment & Plan Note (Signed)
His exam is normal today I have ordered an MRI of the brain to see if there is a secondary cause for this

## 2013-10-07 NOTE — Assessment & Plan Note (Signed)
This is improving with steroids I have ordered an MRI of the brain to see if he has had an acute event (CVA, bleed, tumor)

## 2013-10-07 NOTE — Assessment & Plan Note (Signed)
His BP is not well controlled I have asked him to add bystolic to the ARB-HCTZ combo for better control

## 2013-10-13 ENCOUNTER — Encounter: Payer: Self-pay | Admitting: Cardiovascular Disease

## 2013-10-13 ENCOUNTER — Ambulatory Visit (HOSPITAL_COMMUNITY): Payer: BC Managed Care – PPO | Attending: Cardiovascular Disease | Admitting: Radiology

## 2013-10-13 VITALS — BP 204/94 | HR 58 | Ht 67.0 in | Wt 214.0 lb

## 2013-10-13 DIAGNOSIS — R0602 Shortness of breath: Secondary | ICD-10-CM

## 2013-10-13 DIAGNOSIS — Z8249 Family history of ischemic heart disease and other diseases of the circulatory system: Secondary | ICD-10-CM | POA: Insufficient documentation

## 2013-10-13 DIAGNOSIS — R5381 Other malaise: Secondary | ICD-10-CM | POA: Insufficient documentation

## 2013-10-13 DIAGNOSIS — R0600 Dyspnea, unspecified: Secondary | ICD-10-CM

## 2013-10-13 DIAGNOSIS — R0609 Other forms of dyspnea: Secondary | ICD-10-CM | POA: Insufficient documentation

## 2013-10-13 DIAGNOSIS — I4510 Unspecified right bundle-branch block: Secondary | ICD-10-CM | POA: Insufficient documentation

## 2013-10-13 DIAGNOSIS — R0989 Other specified symptoms and signs involving the circulatory and respiratory systems: Secondary | ICD-10-CM | POA: Insufficient documentation

## 2013-10-13 DIAGNOSIS — R9431 Abnormal electrocardiogram [ECG] [EKG]: Secondary | ICD-10-CM

## 2013-10-13 DIAGNOSIS — F17200 Nicotine dependence, unspecified, uncomplicated: Secondary | ICD-10-CM | POA: Insufficient documentation

## 2013-10-13 DIAGNOSIS — I10 Essential (primary) hypertension: Secondary | ICD-10-CM | POA: Insufficient documentation

## 2013-10-13 MED ORDER — REGADENOSON 0.4 MG/5ML IV SOLN
0.4000 mg | Freq: Once | INTRAVENOUS | Status: AC
  Administered 2013-10-13: 0.4 mg via INTRAVENOUS

## 2013-10-13 MED ORDER — TECHNETIUM TC 99M SESTAMIBI GENERIC - CARDIOLITE
33.0000 | Freq: Once | INTRAVENOUS | Status: AC | PRN
  Administered 2013-10-13: 33 via INTRAVENOUS

## 2013-10-13 MED ORDER — TECHNETIUM TC 99M SESTAMIBI GENERIC - CARDIOLITE
11.0000 | Freq: Once | INTRAVENOUS | Status: AC | PRN
  Administered 2013-10-13: 11 via INTRAVENOUS

## 2013-10-13 NOTE — Progress Notes (Signed)
[facility] SITE 3 NUCLEAR MED [address] [place], [place] [address] [phone]    Cardiology Nuclear Med Study  [name] is a 57 y.o. male     MRN : [id number]     DOB: [birth year]  Procedure Date: 10/13/2013  Nuclear Med Background Indication for Stress Test:  Evaluation for Ischemia History:  RBBB Cardiac Risk Factors: Family History - CAD, Hypertension, RBBB and Smoker(Quit recently)  Symptoms:  DOE, Fatigue and SOB   Nuclear Pre-Procedure Caffeine/Decaff Intake:  None NPO After: Sips of G-Ale this AM   Lungs:  clear O2 Sat: 99% on room air. IV 0.9% NS with Angio Cath:  22g  IV Site: R Antecubital  IV Started by:  Burna Mortimer Deal, RT-N  Chest Size (in):  44 Cup Size: n/a  Height: 5\' 7"  (1.702 m)  Weight:  214 lb (97.07 kg)  BMI:  Body mass index is 33.51 kg/(m^2). Tech Comments:  N/A    Nuclear Med Study 1 or 2 day study: 1 day  Stress Test Type:  Stress  Reading MD: Charlton Haws, MD  Order Authorizing Provider:  Sheryle Spray  Resting Radionuclide: Technetium 61m Sestamibi  Resting Radionuclide Dose: 11.0 mCi   Stress Radionuclide:  Technetium 13m Sestamibi  Stress Radionuclide Dose: 33.0 mCi           Stress Protocol Rest HR: 58 Stress HR: 70  Rest BP: 204/94 Stress BP: 172/84  Exercise Time (min): n/a METS: n/a           Dose of Adenosine (mg):  n/a Dose of Lexiscan: 0.4 mg  Dose of Atropine (mg): n/a Dose of Dobutamine: n/a mcg/kg/min (at max HR)  Stress Test Technologist: Nelson Chimes, BS-ES  Nuclear Technologist:  Domenic Polite, CNMT     Rest Procedure:  Myocardial perfusion imaging was performed at rest 45 minutes following the intravenous administration of Technetium 1m Sestamibi. Rest ECG: NSR-RBBB  Stress Procedure:  The patient received IV Lexiscan 0.4 mg over 15-seconds.  Technetium 50m Sestamibi injected at 30-seconds.  Quantitative spect images were obtained after a 45 minute delay.  During the infusion of Lexiscan, the  patient became SOB, neck felt tight and had a headache.  The symptoms began to resolve in recovery.  Stress ECG: No significant change from baseline ECG  QPS Raw Data Images:  Normal; no motion artifact; normal heart/lung ratio. Stress Images:  Normal homogeneous uptake in all areas of the myocardium. Rest Images:  Normal homogeneous uptake in all areas of the myocardium. Subtraction (SDS):  No evidence of ischemia. Transient Ischemic Dilatation (Normal <1.22):  1.01 Lung/Heart Ratio (Normal <0.45):  0.30  Quantitative Gated Spect Images QGS EDV:  148 ml QGS ESV:  76 ml  Impression Exercise Capacity:  Lexiscan with no exercise. BP Response:  Normal blood pressure response. Clinical Symptoms:  There is dyspnea. ECG Impression:  No significant ST segment change suggestive of ischemia. Comparison with Prior Nuclear Study: No images to compare  Overall Impression:  Low risk stress nuclear study Thinning of the inferior wall not thought to be significant EF mildly depressed suggest MRI/Echo correlation .  LV Ejection Fraction: 49%.  LV Wall Motion:  Normal Wall Motion or Mildly decreased EF diffuse  Charlton Haws

## 2013-10-14 ENCOUNTER — Other Ambulatory Visit: Payer: Self-pay | Admitting: Internal Medicine

## 2013-10-14 ENCOUNTER — Encounter: Payer: Self-pay | Admitting: Internal Medicine

## 2013-10-14 DIAGNOSIS — R9430 Abnormal result of cardiovascular function study, unspecified: Secondary | ICD-10-CM

## 2013-10-14 DIAGNOSIS — IMO0002 Reserved for concepts with insufficient information to code with codable children: Secondary | ICD-10-CM

## 2013-10-14 HISTORY — DX: Abnormal result of cardiovascular function study, unspecified: R94.30

## 2013-10-14 HISTORY — DX: Reserved for concepts with insufficient information to code with codable children: IMO0002

## 2013-10-19 ENCOUNTER — Ambulatory Visit
Admission: RE | Admit: 2013-10-19 | Discharge: 2013-10-19 | Disposition: A | Discharge status: Home / Self Care | Payer: BC Managed Care – PPO | Source: Ambulatory Visit | Attending: Internal Medicine | Admitting: Internal Medicine

## 2013-10-19 ENCOUNTER — Encounter: Payer: Self-pay | Admitting: Internal Medicine

## 2013-10-19 DIAGNOSIS — R51 Headache: Secondary | ICD-10-CM

## 2013-10-19 DIAGNOSIS — H8110 Benign paroxysmal vertigo, unspecified ear: Secondary | ICD-10-CM

## 2013-10-21 ENCOUNTER — Telehealth: Payer: Self-pay | Admitting: *Deleted

## 2013-10-21 NOTE — Telephone Encounter (Signed)
Pt notified of test results & MD recommendations.  Pt was able to access his mychart account and read the results.  States he has set it up where he can access account online on his tablet.  Since he has been able to successfully access mychart account, it will not be disabled at this time.

## 2013-10-21 NOTE — Telephone Encounter (Signed)
His MRI was normal. His heart test did not show any blockages but there was some concern that he does not have a strong heart muscle so I have ordered a ECHO I have asked him to see a neurologist about the headache If he does not have a computer then please disable his MyChart account so I will not send MyChart messages anymore

## 2013-10-21 NOTE — Telephone Encounter (Signed)
Patient phoned in requesting results from stress test completed last week and MRI results...states that he doesn't have a computer at home & therefore, does not have access to [email or web address] & email.  Please advise.   CB# [phone]

## 2013-11-03 ENCOUNTER — Ambulatory Visit (INDEPENDENT_AMBULATORY_CARE_PROVIDER_SITE_OTHER): Payer: BC Managed Care – PPO | Admitting: Internal Medicine

## 2013-11-03 ENCOUNTER — Encounter: Payer: Self-pay | Admitting: Internal Medicine

## 2013-11-03 ENCOUNTER — Ambulatory Visit (INDEPENDENT_AMBULATORY_CARE_PROVIDER_SITE_OTHER)
Admission: RE | Admit: 2013-11-03 | Discharge: 2013-11-03 | Disposition: A | Discharge status: Home / Self Care | Payer: BC Managed Care – PPO | Source: Ambulatory Visit | Attending: Internal Medicine | Admitting: Internal Medicine

## 2013-11-03 VITALS — BP 140/80 | HR 58 | Temp 98.8°F | Resp 16 | Ht 67.0 in | Wt 209.5 lb

## 2013-11-03 DIAGNOSIS — F411 Generalized anxiety disorder: Secondary | ICD-10-CM

## 2013-11-03 DIAGNOSIS — R1011 Right upper quadrant pain: Secondary | ICD-10-CM

## 2013-11-03 DIAGNOSIS — R0989 Other specified symptoms and signs involving the circulatory and respiratory systems: Secondary | ICD-10-CM

## 2013-11-03 DIAGNOSIS — R9430 Abnormal result of cardiovascular function study, unspecified: Secondary | ICD-10-CM

## 2013-11-03 MED ORDER — ALPRAZOLAM 0.5 MG PO TABS: 0.5000 mg | ORAL_TABLET | Freq: Two times a day (BID) | ORAL | Status: DC | PRN

## 2013-11-03 NOTE — Assessment & Plan Note (Signed)
He has RUQ pain that does not appear to be an acute illness His plain film is normal I am concerned that he may have gallstones so I have asked him to have an U/S done

## 2013-11-03 NOTE — Patient Instructions (Signed)
Abdominal Pain, Adult °Many things can cause abdominal pain. Usually, abdominal pain is not caused by a disease and will improve without treatment. It can often be observed and treated at home. Your health care provider will do a physical exam and possibly order blood tests and X-rays to help determine the seriousness of your pain. However, in many cases, more time must pass before a clear cause of the pain can be found. Before that point, your health care provider may not know if you need more testing or further treatment. °HOME CARE INSTRUCTIONS  °Monitor your abdominal pain for any changes. The following actions may help to alleviate any discomfort you are experiencing: °· Only take over-the-counter or prescription medicines as directed by your health care provider. °· Do not take laxatives unless directed to do so by your health care provider. °· Try a clear liquid diet (broth, tea, or water) as directed by your health care provider. Slowly move to a bland diet as tolerated. °SEEK MEDICAL CARE IF: °· You have unexplained abdominal pain. °· You have abdominal pain associated with nausea or diarrhea. °· You have pain when you urinate or have a bowel movement. °· You experience abdominal pain that wakes you in the night. °· You have abdominal pain that is worsened or improved by eating food. °· You have abdominal pain that is worsened with eating fatty foods. °SEEK IMMEDIATE MEDICAL CARE IF:  °· Your pain does not go away within 2 hours. °· You have a fever. °· You keep throwing up (vomiting). °· Your pain is felt only in portions of the abdomen, such as the right side or the left lower portion of the abdomen. °· You pass bloody or black tarry stools. °MAKE SURE YOU: °· Understand these instructions.   °· Will watch your condition.   °· Will get help right away if you are not doing well or get worse.   °Document Released: 07/11/2005 Document Revised: 07/22/2013 Document Reviewed: 06/10/2013 °Exit[employer]® Patient  Information ©2014 [employer]. ° °

## 2013-11-03 NOTE — Progress Notes (Signed)
Subjective:    Patient ID: [name], male    DOB: Nov 16, 1955, 58 y.o.   MRN: [id number]  Abdominal Pain This is a new problem. The current episode started in the past 7 days. The onset quality is gradual. The problem occurs intermittently. The most recent episode lasted 3 days (3 days). The problem has been unchanged. The pain is located in the RUQ. The pain is at a severity of 1/10. The pain is mild. The quality of the pain is colicky and cramping. The abdominal pain does not radiate. Pertinent negatives include no anorexia, arthralgias, belching, constipation, diarrhea, dysuria, fever, flatus, frequency, headaches, hematochezia, hematuria, melena, myalgias, nausea, vomiting or weight loss. Nothing aggravates the pain. The pain is relieved by nothing. He has tried nothing for the symptoms. The treatment provided no relief.      Review of Systems  Constitutional: Negative.  Negative for fever, chills, weight loss, diaphoresis, appetite change and fatigue.  HENT: Negative.   Eyes: Negative.   Respiratory: Negative.   Cardiovascular: Negative.  Negative for chest pain, palpitations and leg swelling.  Gastrointestinal: Positive for abdominal pain. Negative for nausea, vomiting, diarrhea, constipation, melena, hematochezia, abdominal distention, anal bleeding, rectal pain, anorexia and flatus.  Endocrine: Negative.   Genitourinary: Negative.  Negative for dysuria, frequency and hematuria.  Musculoskeletal: Negative.  Negative for arthralgias and myalgias.  Skin: Negative.   Allergic/Immunologic: Negative.   Neurological: Negative.  Negative for dizziness, tremors, syncope, facial asymmetry, weakness, light-headedness, numbness and headaches.  Hematological: Negative.  Negative for adenopathy. Does not bruise/bleed easily.  Psychiatric/Behavioral: Negative.        Objective:   Physical Exam  Vitals reviewed. Constitutional: He is oriented to person, place, and time. He appears  well-developed and well-nourished. No distress.  HENT:  Head: Normocephalic and atraumatic.  Mouth/Throat: Oropharynx is clear and moist. No oropharyngeal exudate.  Eyes: Conjunctivae are normal. Right eye exhibits no discharge. Left eye exhibits no discharge. No scleral icterus.  Neck: Normal range of motion. Neck supple. No JVD present. No tracheal deviation present. No thyromegaly present.  Cardiovascular: Normal rate, regular rhythm, normal heart sounds and intact distal pulses.  Exam reveals no gallop.   No murmur heard. Pulmonary/Chest: Effort normal and breath sounds normal. No stridor. No respiratory distress. He has no wheezes. He has no rales. He exhibits no tenderness.  Abdominal: Soft. Bowel sounds are normal. He exhibits no distension and no mass. There is no tenderness. There is no rebound and no guarding.  Musculoskeletal: Normal range of motion. He exhibits no edema and no tenderness.  Lymphadenopathy:    He has no cervical adenopathy.  Neurological: He is oriented to person, place, and time.  Skin: Skin is warm and dry. No rash noted. He is not diaphoretic. No erythema. No pallor.  Psychiatric: He has a normal mood and affect. His behavior is normal. Judgment and thought content normal.     Lab Results  Component Value Date   WBC 5.6 09/15/2013   HGB 13.2 09/15/2013   HCT 38.8* 09/15/2013   PLT 322.0 09/15/2013   GLUCOSE 86 09/15/2013   CHOL 179 03/17/2013   TRIG 140.0 03/17/2013   HDL 29.80* 03/17/2013   LDLCALC 121* 03/17/2013   ALT 11 09/15/2013   AST 14 09/15/2013   NA 136 09/15/2013   K 4.2 09/15/2013   CL 104 09/15/2013   CREATININE 1.6* 09/15/2013   BUN 22 09/15/2013   CO2 27 09/15/2013   TSH 1.72 09/15/2013   PSA  0.46 03/17/2013       Assessment & Plan:

## 2013-11-03 NOTE — Progress Notes (Signed)
Pre visit review using our clinic review tool, if applicable. No additional management support is needed unless otherwise documented below in the visit note. 

## 2013-11-09 ENCOUNTER — Encounter: Payer: Self-pay | Admitting: Internal Medicine

## 2013-11-09 ENCOUNTER — Ambulatory Visit
Admission: RE | Admit: 2013-11-09 | Discharge: 2013-11-09 | Disposition: A | Discharge status: Home / Self Care | Payer: BC Managed Care – PPO | Source: Ambulatory Visit | Attending: Internal Medicine | Admitting: Internal Medicine

## 2013-11-09 DIAGNOSIS — R1011 Right upper quadrant pain: Secondary | ICD-10-CM

## 2013-11-20 ENCOUNTER — Ambulatory Visit (HOSPITAL_COMMUNITY): Payer: BC Managed Care – PPO | Attending: Internal Medicine | Admitting: Radiology

## 2013-11-20 DIAGNOSIS — Z87891 Personal history of nicotine dependence: Secondary | ICD-10-CM | POA: Insufficient documentation

## 2013-11-20 DIAGNOSIS — R9431 Abnormal electrocardiogram [ECG] [EKG]: Secondary | ICD-10-CM

## 2013-11-20 DIAGNOSIS — I10 Essential (primary) hypertension: Secondary | ICD-10-CM | POA: Insufficient documentation

## 2013-11-20 DIAGNOSIS — R9430 Abnormal result of cardiovascular function study, unspecified: Secondary | ICD-10-CM

## 2013-11-20 NOTE — Progress Notes (Signed)
Echocardiogram performed.  

## 2013-11-26 ENCOUNTER — Telehealth: Payer: Self-pay | Admitting: *Deleted

## 2013-11-26 NOTE — Telephone Encounter (Signed)
Patient phoned requesting results from echo performed last week.  Please advise.  CB# [phone]

## 2013-11-27 NOTE — Telephone Encounter (Signed)
His heart muscle is slightly thickened but the pump function is ok

## 2013-11-30 NOTE — Telephone Encounter (Signed)
Phoned patient and left voicemail message with MD's echo results response.

## 2013-12-24 ENCOUNTER — Other Ambulatory Visit: Payer: Self-pay | Admitting: Internal Medicine

## 2014-05-07 ENCOUNTER — Other Ambulatory Visit: Payer: Self-pay | Admitting: Internal Medicine

## 2014-05-13 ENCOUNTER — Other Ambulatory Visit: Payer: Self-pay | Admitting: Internal Medicine

## 2014-05-13 ENCOUNTER — Telehealth: Payer: Self-pay | Admitting: Internal Medicine

## 2014-05-13 DIAGNOSIS — F411 Generalized anxiety disorder: Secondary | ICD-10-CM

## 2014-05-13 NOTE — Telephone Encounter (Signed)
Patient called and states that his last remaining refill on his Stacie Glaze rx expired at [facility] before he could call them to refill. He says he only has 2 more pills remaining and is requesting a refill. Patient is scheduled to come in next available on 05/26/14. Please advise on refill.

## 2014-05-14 MED ORDER — ALPRAZOLAM 0.5 MG PO TABS: 0.5000 mg | ORAL_TABLET | Freq: Two times a day (BID) | ORAL | Status: DC | PRN

## 2014-05-14 NOTE — Telephone Encounter (Signed)
Patient is calling back in regards to this request. He is completely out and needs it for sleep. Please advise.

## 2014-05-17 ENCOUNTER — Other Ambulatory Visit: Payer: Self-pay | Admitting: Internal Medicine

## 2014-05-17 DIAGNOSIS — F411 Generalized anxiety disorder: Secondary | ICD-10-CM

## 2014-05-17 NOTE — Telephone Encounter (Signed)
An RX was written on 07/31

## 2014-05-26 ENCOUNTER — Encounter: Payer: Self-pay | Admitting: Internal Medicine

## 2014-05-26 ENCOUNTER — Ambulatory Visit (INDEPENDENT_AMBULATORY_CARE_PROVIDER_SITE_OTHER): Payer: BC Managed Care – PPO | Admitting: Internal Medicine

## 2014-05-26 ENCOUNTER — Telehealth: Payer: Self-pay | Admitting: Internal Medicine

## 2014-05-26 VITALS — BP 122/70 | HR 63 | Temp 98.5°F | Resp 16 | Ht 67.0 in | Wt 192.0 lb

## 2014-05-26 DIAGNOSIS — I10 Essential (primary) hypertension: Secondary | ICD-10-CM

## 2014-05-26 DIAGNOSIS — R9430 Abnormal result of cardiovascular function study, unspecified: Secondary | ICD-10-CM

## 2014-05-26 DIAGNOSIS — R0989 Other specified symptoms and signs involving the circulatory and respiratory systems: Secondary | ICD-10-CM

## 2014-05-26 DIAGNOSIS — R0600 Dyspnea, unspecified: Secondary | ICD-10-CM

## 2014-05-26 DIAGNOSIS — R0609 Other forms of dyspnea: Secondary | ICD-10-CM

## 2014-05-26 DIAGNOSIS — R9431 Abnormal electrocardiogram [ECG] [EKG]: Secondary | ICD-10-CM

## 2014-05-26 DIAGNOSIS — F411 Generalized anxiety disorder: Secondary | ICD-10-CM

## 2014-05-26 NOTE — Progress Notes (Signed)
Pre visit review using our clinic review tool, if applicable. No additional management support is needed unless otherwise documented below in the visit note. 

## 2014-05-26 NOTE — Progress Notes (Signed)
   Subjective:    Patient ID: [name], male    DOB: 1956/07/22, 58 y.o.   MRN: [id number]  Hypertension This is a chronic problem. The current episode started more than 1 year ago. The problem has been gradually improving since onset. The problem is controlled. Associated symptoms include anxiety and shortness of breath (DOE). Pertinent negatives include no blurred vision, chest pain, headaches, malaise/fatigue, neck pain, orthopnea, palpitations, peripheral edema, PND or sweats. Past treatments include angiotensin blockers, beta blockers and diuretics. The current treatment provides significant improvement. Compliance problems include diet and exercise.  Hypertensive end-organ damage includes left ventricular hypertrophy.      Review of Systems  Constitutional: Positive for fatigue. Negative for fever, chills, malaise/fatigue, diaphoresis, activity change, appetite change and unexpected weight change.  HENT: Negative.   Eyes: Negative.  Negative for blurred vision.  Respiratory: Positive for shortness of breath (DOE). Negative for apnea, cough, choking, chest tightness, wheezing and stridor.   Cardiovascular: Negative.  Negative for chest pain, palpitations, orthopnea, leg swelling and PND.  Gastrointestinal: Negative.  Negative for nausea, vomiting, abdominal pain, diarrhea, constipation and blood in stool.  Endocrine: Negative.   Genitourinary: Negative.   Musculoskeletal: Negative.  Negative for arthralgias, back pain, myalgias and neck pain.  Skin: Negative.   Allergic/Immunologic: Negative.   Neurological: Negative.  Negative for headaches.  Hematological: Negative.  Negative for adenopathy. Does not bruise/bleed easily.  Psychiatric/Behavioral: Negative.        Objective:   Physical Exam  Vitals reviewed. Constitutional: He is oriented to person, place, and time. He appears well-developed and well-nourished. No distress.  HENT:  Head: Normocephalic and atraumatic.    Mouth/Throat: Oropharynx is clear and moist. No oropharyngeal exudate.  Eyes: Conjunctivae are normal. Right eye exhibits no discharge. Left eye exhibits no discharge. No scleral icterus.  Neck: Normal range of motion. Neck supple. No JVD present. No tracheal deviation present. No thyromegaly present.  Cardiovascular: Normal rate, regular rhythm, normal heart sounds and intact distal pulses.  Exam reveals no gallop and no friction rub.   No murmur heard. Pulmonary/Chest: Effort normal and breath sounds normal. No stridor. No respiratory distress. He has no wheezes. He has no rales. He exhibits no tenderness.  Abdominal: Soft. Bowel sounds are normal. He exhibits no distension and no mass. There is no tenderness. There is no rebound and no guarding.  Musculoskeletal: Normal range of motion. He exhibits no edema and no tenderness.  Lymphadenopathy:    He has no cervical adenopathy.  Neurological: He is oriented to person, place, and time.  Skin: Skin is warm and dry. No rash noted. He is not diaphoretic. No erythema. No pallor.     Lab Results  Component Value Date   WBC 5.6 09/15/2013   HGB 13.2 09/15/2013   HCT 38.8* 09/15/2013   PLT 322.0 09/15/2013   GLUCOSE 86 09/15/2013   CHOL 179 03/17/2013   TRIG 140.0 03/17/2013   HDL 29.80* 03/17/2013   LDLCALC 121* 03/17/2013   ALT 11 09/15/2013   AST 14 09/15/2013   NA 136 09/15/2013   K 4.2 09/15/2013   CL 104 09/15/2013   CREATININE 1.6* 09/15/2013   BUN 22 09/15/2013   CO2 27 09/15/2013   TSH 1.72 09/15/2013   PSA 0.46 03/17/2013       Assessment & Plan:

## 2014-05-26 NOTE — Telephone Encounter (Signed)
Relevant patient education assigned to patient using Emmi. ° °

## 2014-05-28 NOTE — Assessment & Plan Note (Signed)
His BP is well controlled 

## 2014-05-28 NOTE — Assessment & Plan Note (Signed)
I am concerned that he developing s/s of CHF/LVH I have asked him to f/up with cardiology for further evaluation

## 2014-06-23 ENCOUNTER — Other Ambulatory Visit: Payer: Self-pay | Admitting: Internal Medicine

## 2014-07-07 ENCOUNTER — Encounter: Payer: Self-pay | Admitting: Cardiology

## 2014-07-07 ENCOUNTER — Ambulatory Visit (INDEPENDENT_AMBULATORY_CARE_PROVIDER_SITE_OTHER): Payer: BC Managed Care – PPO | Admitting: Cardiology

## 2014-07-07 VITALS — BP 122/70 | HR 66 | Ht 67.0 in | Wt 191.0 lb

## 2014-07-07 DIAGNOSIS — I739 Peripheral vascular disease, unspecified: Secondary | ICD-10-CM

## 2014-07-07 DIAGNOSIS — I119 Hypertensive heart disease without heart failure: Secondary | ICD-10-CM

## 2014-07-07 DIAGNOSIS — I10 Essential (primary) hypertension: Secondary | ICD-10-CM

## 2014-07-07 DIAGNOSIS — I5189 Other ill-defined heart diseases: Secondary | ICD-10-CM | POA: Insufficient documentation

## 2014-07-07 DIAGNOSIS — I519 Heart disease, unspecified: Secondary | ICD-10-CM

## 2014-07-07 HISTORY — DX: Other ill-defined heart diseases: I51.89

## 2014-07-07 HISTORY — DX: Hypertensive heart disease without heart failure: I11.9

## 2014-07-07 MED ORDER — AMLODIPINE BESYLATE 10 MG PO TABS: 10.0000 mg | ORAL_TABLET | Freq: Every day | ORAL | Status: DC

## 2014-07-07 NOTE — Patient Instructions (Signed)
Your physician has recommended you make the following change in your medication:   STOP TAKING BYSTOLIC NOW  START TAKING AMLODIPINE 10 MG NOW    Your physician has requested that you have a lower extremity arterial duplex. This test is an ultrasound of the arteries in the legs or arms. It looks at arterial blood flow in the legs and arms. Allow one hour for Lower and Upper Arterial scans. There are no restrictions or special instructions   Your physician recommends that you schedule a follow-up appointment in: [place]

## 2014-07-07 NOTE — Progress Notes (Signed)
Patient ID: Rilen Shukla, male   DOB: [birth year], 58 y.o.   MRN: [id number]    Patient Name: [name] Date of Encounter: 07/07/2014  Primary Care Provider:  Scarlette Calico, MD Primary Cardiologist:  Dorothy Spark  Problem List   Past Medical History  Diagnosis Date  . GERD (gastroesophageal reflux disease)   . Arthritis   . History of blood transfusion   . Allergy    No past surgical history on file.  Allergies  Allergies  Allergen Reactions  . Lisinopril     cough  . Tramadol     insomnia    HPI  A pleasant 58 year old male with h/o hypertension - long standing untreated, started on antihypertensives 1 year ago. One year ago he developed exertional chest pain and SOB and underwent stress testing. He couldn't walk - poor functional capacity, so switched to [place].  Echocardiogram showed preserved LVEF, impaired relaxation and moderate LVH. He is very concerned about his LVH and wants to know what can be done to improve it. Exertional CP and SOB has improved, but he feels fatigued all the time, worse than before. He quit smoking 1 year ago.  The patient complains of cold weather intolerance - with hands and feet turning cold and blue. He also experiences claudications after walking a short distance. No palpitations, syncope. No orthopnea or PND.   Home Medications  Prior to Admission medications   Medication Sig Start Date End Date Taking? Authorizing Provider  ALPRAZolam Duanne Moron) 0.5 MG tablet Take 1 tablet (0.5 mg total) by mouth 2 (two) times daily as needed for sleep. Take 1/2 to 1 tablets BID 05/14/14   Janith Lima, MD  Aspirin-Acetaminophen-Caffeine (EXCEDRIN PO) Take 2 tablets by mouth 4 (four) times daily.    Historical Provider, MD  nebivolol (BYSTOLIC) 10 MG tablet Take 1 tablet (10 mg total) by mouth daily. 10/06/13   Janith Lima, MD  omeprazole (PRILOSEC) 40 MG capsule Take 40 mg by mouth 2 (two) times daily.    Historical Provider, MD    telmisartan-hydrochlorothiazide (MICARDIS HCT) 40-12.5 MG per tablet TAKE ONE TABLET BY MOUTH ONCE DAILY 06/24/14   Janith Lima, MD    Family History  Family History  Problem Relation Age of Onset  . Arthritis Other   . Diabetes Other   . Stroke Mother   . Hypertension Other   . Asthma Father   . Stroke Mother   . Hypertension Mother   . Diabetes Father   . Lung cancer Brother     was a smoker  . Emphysema Father     was a smoker    Social History  History   Social History  . Marital Status: Single    Spouse Name: N/A    Number of Children: N/A  . Years of Education: N/A   Occupational History  . Not on file.   Social History Main Topics  . Smoking status: Former Smoker -- 1.00 packs/day for 40 years    Types: Cigarettes    Start date: 03/17/1972    Quit date: 06/17/2013  . Smokeless tobacco: Never Used  . Alcohol Use: No  . Drug Use: No  . Sexual Activity: Yes    Partners: Female   Other Topics Concern  . Not on file   Social History Narrative  . No narrative on file     Review of Systems, as per HPI, otherwise negative General:  No chills, fever, night sweats or  weight changes.  Cardiovascular:  No chest pain, dyspnea on exertion, edema, orthopnea, palpitations, paroxysmal nocturnal dyspnea. Dermatological: No rash, lesions/masses Respiratory: No cough, dyspnea Urologic: No hematuria, dysuria Abdominal:   No nausea, vomiting, diarrhea, bright red blood per rectum, melena, or hematemesis Neurologic:  No visual changes, wkns, changes in mental status. All other systems reviewed and are otherwise negative except as noted above.  Physical Exam  Blood pressure 122/70, pulse 66, height 5\' 7"  (1.702 m), weight 191 lb (86.637 kg).  General: Pleasant, NAD Psych: Normal affect. Neuro: Alert and oriented X 3. Moves all extremities spontaneously. HEENT: Normal  Neck: Supple without bruits or JVD. Lungs:  Resp regular and unlabored, CTA. Heart: RRR no  s3, s4, or murmurs. Abdomen: Soft, non-tender, non-distended, BS + x 4.  Extremities: No clubbing, cyanosis or edema. DP/PT weak B/L, Radials 2+ and equal bilaterally.  Labs:  No results found for this basename: CKTOTAL, CKMB, TROPONINI,  in the last 72 hours Lab Results  Component Value Date   WBC 5.6 09/15/2013   HGB 13.2 09/15/2013   HCT 38.8* 09/15/2013   MCV 94.3 09/15/2013   PLT 322.0 09/15/2013    Lab Results  Component Value Date   DDIMER 0.28 09/15/2013   No components found with this basename: POCBNP,     Component Value Date/Time   NA 136 09/15/2013 0913   K 4.2 09/15/2013 0913   CL 104 09/15/2013 0913   CO2 27 09/15/2013 0913   GLUCOSE 86 09/15/2013 0913   BUN 22 09/15/2013 0913   CREATININE 1.6* 09/15/2013 0913   CALCIUM 9.2 09/15/2013 0913   PROT 7.3 09/15/2013 0913   ALBUMIN 3.7 09/15/2013 0913   AST 14 09/15/2013 0913   ALT 11 09/15/2013 0913   ALKPHOS 83 09/15/2013 0913   BILITOT 0.6 09/15/2013 0913   Lab Results  Component Value Date   CHOL 179 03/17/2013   HDL 29.80* 03/17/2013   LDLCALC 121* 03/17/2013   TRIG 140.0 03/17/2013    Accessory Clinical Findings  Echocardiogram - 11/20/2013 Left ventricle: The cavity size was normal. Wall thickness was increased in a pattern of moderate LVH. There was focal basal hypertrophy. Systolic function was normal. The estimated ejection fraction was in the range of 50% to 55%. Wall motion was normal; there were no regional wall motion abnormalities. - Left atrium: The atrium was mildly dilated. - Right atrium: The atrium was mildly dilated.  ECG - SR, RBBB  Exercise nuclear stress test: 10/13/2013 Impression Exercise Capacity:  Lexiscan with no exercise. BP Response:  Normal blood pressure response. Clinical Symptoms:  There is dyspnea. ECG Impression:  No significant ST segment change suggestive of ischemia. Comparison with Prior Nuclear Study: No images to compare  Overall Impression:  Low risk stress nuclear study  Thinning of the inferior wall not thought to be significant EF mildly depressed suggest MRI/Echo correlation .  LV Ejection Fraction: 49%.  LV Wall Motion:  Normal Wall Motion or Mildly decreased EF diffuse   Assessment & Plan  58 year old male   1. LVH - moderate with impaired relaxation, I personally reviewed his echocardiogram and there is no suspicion for infiltrative cardiomyopathy, there is concentric LVH that is most certainly a result of long standing hypertension. He was explained that it is reversible with good BP control.  2. Hypertension - well controlled, however significant fatigue. We will try to switch nebivolol to amlodipine 10 mg po daily. Continue telmisartan- HCTZ  3. Claudications - check B/L LE  arterial Doppler, if negative refer to rheumatology for evaluation of Raynaud disease.  Follow up in 1 month.   Dorothy Spark, MD, [facility] 07/07/2014, 2:46 PM

## 2014-07-09 ENCOUNTER — Other Ambulatory Visit (HOSPITAL_COMMUNITY): Payer: Self-pay | Admitting: *Deleted

## 2014-07-09 DIAGNOSIS — I70219 Atherosclerosis of native arteries of extremities with intermittent claudication, unspecified extremity: Secondary | ICD-10-CM

## 2014-07-14 ENCOUNTER — Ambulatory Visit (HOSPITAL_COMMUNITY): Payer: BC Managed Care – PPO | Attending: Cardiology | Admitting: Cardiology

## 2014-07-14 DIAGNOSIS — I739 Peripheral vascular disease, unspecified: Secondary | ICD-10-CM

## 2014-07-14 DIAGNOSIS — R209 Unspecified disturbances of skin sensation: Secondary | ICD-10-CM | POA: Insufficient documentation

## 2014-07-14 DIAGNOSIS — R200 Anesthesia of skin: Secondary | ICD-10-CM

## 2014-07-14 DIAGNOSIS — Z87891 Personal history of nicotine dependence: Secondary | ICD-10-CM | POA: Diagnosis not present

## 2014-07-14 DIAGNOSIS — R0989 Other specified symptoms and signs involving the circulatory and respiratory systems: Secondary | ICD-10-CM | POA: Insufficient documentation

## 2014-07-14 DIAGNOSIS — I10 Essential (primary) hypertension: Secondary | ICD-10-CM | POA: Insufficient documentation

## 2014-07-14 DIAGNOSIS — I70219 Atherosclerosis of native arteries of extremities with intermittent claudication, unspecified extremity: Secondary | ICD-10-CM | POA: Diagnosis not present

## 2014-07-14 NOTE — Progress Notes (Signed)
Lower arterial doppler performed. 

## 2014-08-02 ENCOUNTER — Encounter: Payer: Self-pay | Admitting: *Deleted

## 2014-08-03 ENCOUNTER — Ambulatory Visit (INDEPENDENT_AMBULATORY_CARE_PROVIDER_SITE_OTHER): Payer: BC Managed Care – PPO | Admitting: Cardiology

## 2014-08-03 ENCOUNTER — Encounter: Payer: Self-pay | Admitting: Cardiology

## 2014-08-03 VITALS — BP 134/68 | HR 96 | Ht 67.0 in | Wt 193.4 lb

## 2014-08-03 DIAGNOSIS — I5032 Chronic diastolic (congestive) heart failure: Secondary | ICD-10-CM

## 2014-08-03 DIAGNOSIS — I7300 Raynaud's syndrome without gangrene: Secondary | ICD-10-CM

## 2014-08-03 NOTE — Progress Notes (Signed)
Patient ID: [name], male   DOB: March 28, 1956, 58 y.o.   MRN: [id number]    Patient Name: [name] Date of Encounter: 08/03/2014  Primary Care Provider:  Scarlette Calico, MD Primary Cardiologist:  Dorothy Spark  Problem List   Past Medical History  Diagnosis Date  . GERD (gastroesophageal reflux disease)   . Arthritis   . History of blood transfusion   . Allergy   . Essential hypertension, benign 03/17/2013  . LVH (left ventricular hypertrophy) due to hypertensive disease 07/07/2014  . Benign paroxysmal positional vertigo 10/06/2013    Jan 2015  MPRESSION: 1. No acute intracranial abnormality. 2. 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 3. Evidence of chronic right maxillary sinusitis. Possible superimposed mild acute sinusitis    . Diastolic dysfunction 2/54/2706  . Dyspnea 09/15/2013  . Ejection fraction < 50% 10/14/2013    Study Conclusions  - Left ventricle: The cavity size was normal. Wall thickness was increased in a pattern of moderate LVH. There was focal basal hypertrophy. Systolic function was normal. The estimated ejection fraction was in the range of 50% to 55%. Wall motion was normal; there were no regional wall motion abnormalities. - Left atrium: The atrium was mildly dilated. - Right atrium: The atrium was mildly dilated Overall Impression: Low risk stress nuclear study Thinning of the inferior wall not thought to be significant EF mildly depressed suggest MRI/Echo correlation .  LV Ejection Fraction: 49%. LV Wall Motion: Normal Wall Motion or Mildly decreased EF diffuse    . GAD (generalized anxiety disorder) 03/17/2013  . Snoring 03/17/2013   No past surgical history on file.  Allergies  Allergies  Allergen Reactions  . Lisinopril     cough  . Tramadol     insomnia    HPI  A pleasant 58 year old male with h/o hypertension - long standing untreated, started on antihypertensives 1 year ago. One year  ago he developed exertional chest pain and SOB and underwent stress testing. He couldn't walk - poor functional capacity, so switched to [place].  Echocardiogram showed preserved LVEF, impaired relaxation and moderate LVH. He is very concerned about his LVH and wants to know what can be done to improve it. Exertional CP and SOB has improved, but he feels fatigued all the time, worse than before. He quit smoking 1 year ago.  The patient complains of cold weather intolerance - with hands and feet turning cold and blue. He also experiences claudications after walking a short distance. No palpitations, syncope. No orthopnea or PND.   08/03/2014 - the patient is coming after 1 month, he tolerates amlodipine well and states that he feels less tired. He stays active working with stable [employer]. He still experiences significant bluish discoloration of his fingers and toes when exposed to cold.  Home Medications  Prior to Admission medications   Medication Sig Start Date End Date Taking? Authorizing Provider  ALPRAZolam Duanne Moron) 0.5 MG tablet Take 1 tablet (0.5 mg total) by mouth 2 (two) times daily as needed for sleep. Take 1/2 to 1 tablets BID 05/14/14   Janith Lima, MD  Aspirin-Acetaminophen-Caffeine (EXCEDRIN PO) Take 2 tablets by mouth 4 (four) times daily.    Historical Provider, MD  nebivolol (BYSTOLIC) 10 MG tablet Take 1 tablet (10 mg total) by mouth daily. 10/06/13   Janith Lima, MD  omeprazole (PRILOSEC) 40 MG capsule Take 40 mg by mouth 2 (two) times daily.  Historical Provider, MD  telmisartan-hydrochlorothiazide (MICARDIS HCT) 40-12.5 MG per tablet TAKE ONE TABLET BY MOUTH ONCE DAILY 06/24/14   Janith Lima, MD    Family History  Family History  Problem Relation Age of Onset  . Arthritis Other   . Diabetes Other   . Stroke Mother   . Hypertension Other   . Asthma Father   . Stroke Mother   . Hypertension Mother   . Diabetes Father   . Lung cancer Brother     was a smoker  .  Emphysema Father     was a smoker    Social History  History   Social History  . Marital Status: Single    Spouse Name: N/A    Number of Children: N/A  . Years of Education: N/A   Occupational History  . Not on file.   Social History Main Topics  . Smoking status: Former Smoker -- 1.00 packs/day for 40 years    Types: Cigarettes    Start date: 03/17/1972    Quit date: 06/17/2013  . Smokeless tobacco: Never Used  . Alcohol Use: No  . Drug Use: No  . Sexual Activity: Yes    Partners: Female   Other Topics Concern  . Not on file   Social History Narrative  . No narrative on file     Review of Systems, as per HPI, otherwise negative General:  No chills, fever, night sweats or weight changes.  Cardiovascular:  No chest pain, dyspnea on exertion, edema, orthopnea, palpitations, paroxysmal nocturnal dyspnea. Dermatological: No rash, lesions/masses Respiratory: No cough, dyspnea Urologic: No hematuria, dysuria Abdominal:   No nausea, vomiting, diarrhea, bright red blood per rectum, melena, or hematemesis Neurologic:  No visual changes, wkns, changes in mental status. All other systems reviewed and are otherwise negative except as noted above.  Physical Exam  Blood pressure 134/68, pulse 96, height 5\' 7"  (1.702 m), weight 193 lb 6.4 oz (87.726 kg).  General: Pleasant, NAD Psych: Normal affect. Neuro: Alert and oriented X 3. Moves all extremities spontaneously. HEENT: Normal  Neck: Supple without bruits or JVD. Lungs:  Resp regular and unlabored, CTA. Heart: RRR no s3, s4, or murmurs. Abdomen: Soft, non-tender, non-distended, BS + x 4.  Extremities: No clubbing, cyanosis or edema. DP/PT weak B/L, Radials 2+ and equal bilaterally.  Labs:  No results found for this basename: CKTOTAL, CKMB, TROPONINI,  in the last 72 hours Lab Results  Component Value Date   WBC 5.6 09/15/2013   HGB 13.2 09/15/2013   HCT 38.8* 09/15/2013   MCV 94.3 09/15/2013   PLT 322.0 09/15/2013     Lab Results  Component Value Date   DDIMER 0.28 09/15/2013   No components found with this basename: POCBNP,     Component Value Date/Time   NA 136 09/15/2013 0913   K 4.2 09/15/2013 0913   CL 104 09/15/2013 0913   CO2 27 09/15/2013 0913   GLUCOSE 86 09/15/2013 0913   BUN 22 09/15/2013 0913   CREATININE 1.6* 09/15/2013 0913   CALCIUM 9.2 09/15/2013 0913   PROT 7.3 09/15/2013 0913   ALBUMIN 3.7 09/15/2013 0913   AST 14 09/15/2013 0913   ALT 11 09/15/2013 0913   ALKPHOS 83 09/15/2013 0913   BILITOT 0.6 09/15/2013 0913   Lab Results  Component Value Date   CHOL 179 03/17/2013   HDL 29.80* 03/17/2013   LDLCALC 121* 03/17/2013   TRIG 140.0 03/17/2013    Accessory Clinical Findings  Echocardiogram -  11/20/2013 Left ventricle: The cavity size was normal. Wall thickness was increased in a pattern of moderate LVH. There was focal basal hypertrophy. Systolic function was normal. The estimated ejection fraction was in the range of 50% to 55%. Wall motion was normal; there were no regional wall motion abnormalities. - Left atrium: The atrium was mildly dilated. - Right atrium: The atrium was mildly dilated.  ECG - SR, RBBB  Exercise nuclear stress test: 10/13/2013 Impression Exercise Capacity:  Lexiscan with no exercise. BP Response:  Normal blood pressure response. Clinical Symptoms:  There is dyspnea. ECG Impression:  No significant ST segment change suggestive of ischemia. Comparison with Prior Nuclear Study: No images to compare  Overall Impression:  Low risk stress nuclear study Thinning of the inferior wall not thought to be significant EF mildly depressed suggest MRI/Echo correlation .  LV Ejection Fraction: 49%.  LV Wall Motion:  Normal Wall Motion or Mildly decreased EF diffuse   Assessment & Plan  58 year old male   1. LVH - moderate with impaired relaxation, I personally reviewed his echocardiogram and there is no suspicion for infiltrative cardiomyopathy, there is concentric LVH  that is most certainly a result of long standing hypertension. He was explained that it is reversible with good BP control.  2. Hypertension - well controlled, however significant fatigue, improved with amlodipine 10 mg PO daily.  Continue telmisartan- HCTZ  3. Raynaud syndrome, normal B/L LE arterial Doppler normal, we will refer to rheumatology for evaluation of Raynaud disease.  Follow up in 1 year with echocardiogram prior to the visit.   Dorothy Spark, MD, [facility] 08/03/2014, 9:44 AM

## 2014-08-03 NOTE — Patient Instructions (Signed)
Your physician recommends that you continue on your current medications as directed. Please refer to the Current Medication list given to you today.   Your physician has requested that you have an echocardiogram . Echocardiography is a painless test that uses sound waves to create images of your heart. It provides your doctor with information about the size and shape of your heart and how well your heart's chambers and valves are working. This procedure takes approximately one hour. There are no restrictions for this procedure. THIS SHOULD BE SCHEDULED PRIOR TO YOUR ONE YEAR OFFICE VISIT WITH DR Meda Coffee  You have been referred to A RHEUMATOLOGIST FOR RAYNAUDS SYNDROME   Your physician wants you to follow-up in: [place] will receive a reminder letter in the mail two months in advance. If you don't receive a letter, please call our office to schedule the follow-up appointment.

## 2014-08-06 ENCOUNTER — Ambulatory Visit: Payer: BC Managed Care – PPO | Admitting: Cardiology

## 2014-11-10 ENCOUNTER — Encounter: Payer: Self-pay | Admitting: Internal Medicine

## 2014-11-10 ENCOUNTER — Ambulatory Visit (INDEPENDENT_AMBULATORY_CARE_PROVIDER_SITE_OTHER): Payer: BLUE CROSS/BLUE SHIELD | Admitting: Internal Medicine

## 2014-11-10 VITALS — BP 152/84 | HR 80 | Temp 98.1°F | Resp 14 | Ht 67.0 in | Wt 192.1 lb

## 2014-11-10 DIAGNOSIS — L309 Dermatitis, unspecified: Secondary | ICD-10-CM | POA: Diagnosis not present

## 2014-11-10 MED ORDER — MOMETASONE FUROATE 0.1 % EX OINT: TOPICAL_OINTMENT | Freq: Every day | CUTANEOUS | Status: DC

## 2014-11-10 NOTE — Progress Notes (Signed)
Pre visit review using our clinic review tool, if applicable. No additional management support is needed unless otherwise documented below in the visit note. 

## 2014-11-10 NOTE — Patient Instructions (Addendum)
Please bring actual pill bottles to all appointments. Verify the name and exact dose of all medicines.     Use Aveeno Daily  Moisturizing Lotion  twice a day  for the dry skin. Bathe with moisturizing liquid soap , not bar soap.  Do not use ointment on face.

## 2014-11-10 NOTE — Progress Notes (Signed)
   Subjective:    Patient ID: [name], male    DOB: 09-14-1956, 59 y.o.   MRN: [id number]  HPI   For the last 2 months he's had diffuse rash which is scaly, dry, and patchy in distribution. He has a history of eczema.  The rash is on both legs, arms, & back.  This is associated with itching but not pain  He does bathe with bar soap. He's had no exposures to any new detergents or chemicals. He does bathe twice a day  Over-the-counter eczema medication has not been of benefit.  Unrelated to this is pain @ the left great toe which is tender to touch. This has been evaluated by his primary care physician  He also has been evaluated for sensitivity to heat and cold which is also not new. He had this evaluated by cardiologist and rheumatologist.   Review of Systems  No associated itchy, watery eyes.  Swelling of the lips or tongue denied.  Shortness of breath, wheezing, or cough absent.  No urticaria noted.  Fever ,chills , or sweats denied. Purulence absent.  Diarrhea not present.     Objective:   Physical Exam Pertinent or positive findings include: The maxilla is edentulous. He has a lower partial. The nasal septum is deviated to the right. This is slightly erythematous. He is a diffuse ,bilateral, dry, slightly erythematous rash which is localized in small patches over the extremities, mainly over the legs.No maceration or vesicles. Ingrown L great toenail  General appearance :adequately nourished; in no distress. Eyes: No conjunctival inflammation or scleral icterus is present. Oral exam: Lips and gums are healthy appearing.There is no oropharyngeal erythema or exudate noted.  Heart:  Normal rate and regular rhythm. S1 and S2 normal without gallop, murmur, click, rub or other extra sounds Lungs:Chest clear to auscultation; no wheezes, rhonchi,rales ,or rubs present.No increased work of breathing.  Abdomen: bowel sounds normal, soft and non-tender without masses,  organomegaly or hernias noted.  No guarding or rebound.  Vascular : all pulses equal ; no bruits present. Skin:Warm & dry.  no jaundice or tenting Lymphatic: No lymphadenopathy is noted about the head, neck, axilla       Assessment & Plan:  #1 eczema  Plan: See after visit summary

## 2014-11-17 ENCOUNTER — Ambulatory Visit (INDEPENDENT_AMBULATORY_CARE_PROVIDER_SITE_OTHER): Payer: BLUE CROSS/BLUE SHIELD

## 2014-11-17 DIAGNOSIS — Z23 Encounter for immunization: Secondary | ICD-10-CM

## 2014-11-24 ENCOUNTER — Other Ambulatory Visit: Payer: Self-pay | Admitting: Internal Medicine

## 2015-01-06 ENCOUNTER — Telehealth: Payer: Self-pay | Admitting: Internal Medicine

## 2015-01-06 DIAGNOSIS — L309 Dermatitis, unspecified: Secondary | ICD-10-CM

## 2015-01-06 MED ORDER — MOMETASONE FUROATE 0.1 % EX OINT: TOPICAL_OINTMENT | Freq: Every day | CUTANEOUS | Status: DC

## 2015-01-06 NOTE — Telephone Encounter (Signed)
done

## 2015-01-06 NOTE — Telephone Encounter (Signed)
Patient is requesting refill on monetasone 0.1%.  Patient came in on his last visit and was prescribed this by Dr. Linna Darner. States it helped but eczema is coming back again.

## 2015-05-23 ENCOUNTER — Telehealth: Payer: Self-pay | Admitting: Internal Medicine

## 2015-05-30 NOTE — Telephone Encounter (Signed)
Pt would have to pay the same co pay for two pills as they would for a 30 day script. Dr. Ronnald Ramp will RF on Wed. 8/17

## 2015-05-30 NOTE — Telephone Encounter (Signed)
I can't get patient appt till Wednesday and he is out of his prescription. Can you fill enough till he comes in? Please advise

## 2015-05-30 NOTE — Telephone Encounter (Signed)
Pt informed

## 2015-06-01 ENCOUNTER — Ambulatory Visit (INDEPENDENT_AMBULATORY_CARE_PROVIDER_SITE_OTHER): Payer: BLUE CROSS/BLUE SHIELD | Admitting: Internal Medicine

## 2015-06-01 ENCOUNTER — Encounter: Payer: Self-pay | Admitting: Internal Medicine

## 2015-06-01 ENCOUNTER — Other Ambulatory Visit (INDEPENDENT_AMBULATORY_CARE_PROVIDER_SITE_OTHER): Payer: BLUE CROSS/BLUE SHIELD

## 2015-06-01 VITALS — BP 138/78 | HR 66 | Temp 98.2°F | Resp 14 | Ht 67.0 in | Wt 192.0 lb

## 2015-06-01 DIAGNOSIS — I10 Essential (primary) hypertension: Secondary | ICD-10-CM

## 2015-06-01 DIAGNOSIS — E785 Hyperlipidemia, unspecified: Secondary | ICD-10-CM | POA: Diagnosis not present

## 2015-06-01 DIAGNOSIS — F411 Generalized anxiety disorder: Secondary | ICD-10-CM | POA: Diagnosis not present

## 2015-06-01 DIAGNOSIS — L309 Dermatitis, unspecified: Secondary | ICD-10-CM | POA: Diagnosis not present

## 2015-06-01 LAB — CBC WITH DIFFERENTIAL/PLATELET
BASOS ABS: 0.1 10*3/uL (ref 0.0–0.1)
Basophils Relative: 1.2 % (ref 0.0–3.0)
Eosinophils Absolute: 0.6 10*3/uL (ref 0.0–0.7)
Eosinophils Relative: 9.7 % — ABNORMAL HIGH (ref 0.0–5.0)
HCT: 40.8 % (ref 39.0–52.0)
Hemoglobin: 13.9 g/dL (ref 13.0–17.0)
LYMPHS ABS: 1.7 10*3/uL (ref 0.7–4.0)
Lymphocytes Relative: 26.3 % (ref 12.0–46.0)
MCHC: 34.1 g/dL (ref 30.0–36.0)
MCV: 94.6 fl (ref 78.0–100.0)
MONOS PCT: 12.1 % — AB (ref 3.0–12.0)
Monocytes Absolute: 0.8 10*3/uL (ref 0.1–1.0)
NEUTROS ABS: 3.4 10*3/uL (ref 1.4–7.7)
Neutrophils Relative %: 50.7 % (ref 43.0–77.0)
PLATELETS: 310 10*3/uL (ref 150.0–400.0)
RBC: 4.31 Mil/uL (ref 4.22–5.81)
RDW: 15.4 % (ref 11.5–15.5)
WBC: 6.6 10*3/uL (ref 4.0–10.5)

## 2015-06-01 LAB — LIPID PANEL
CHOL/HDL RATIO: 5
Cholesterol: 191 mg/dL (ref 0–200)
HDL: 40 mg/dL (ref >39.00)
LDL Cholesterol: 122 mg/dL — ABNORMAL HIGH (ref 0–99)
NonHDL: 150.54
Triglycerides: 143 mg/dL (ref 0.0–149.0)
VLDL: 28.6 mg/dL (ref 0.0–40.0)

## 2015-06-01 LAB — COMPREHENSIVE METABOLIC PANEL
ALT: 12 U/L (ref 0–53)
AST: 19 U/L (ref 0–37)
Albumin: 4 g/dL (ref 3.5–5.2)
Alkaline Phosphatase: 97 U/L (ref 39–117)
BUN: 16 mg/dL (ref 6–23)
CO2: 23 meq/L (ref 19–32)
CREATININE: 1.56 mg/dL — AB (ref 0.40–1.50)
Calcium: 9.2 mg/dL (ref 8.4–10.5)
Chloride: 106 mEq/L (ref 96–112)
GFR: 48.63 mL/min — AB (ref >60.00)
Glucose, Bld: 77 mg/dL (ref 70–99)
Potassium: 3.7 mEq/L (ref 3.5–5.1)
SODIUM: 138 meq/L (ref 135–145)
Total Bilirubin: 0.3 mg/dL (ref 0.2–1.2)
Total Protein: 7.3 g/dL (ref 6.0–8.3)

## 2015-06-01 LAB — TSH: TSH: 0.71 u[IU]/mL (ref 0.35–4.50)

## 2015-06-01 MED ORDER — ASPIRIN EC 81 MG PO TBEC: 81.0000 mg | DELAYED_RELEASE_TABLET | Freq: Every day | ORAL | Status: AC

## 2015-06-01 MED ORDER — ALPRAZOLAM 0.5 MG PO TABS: ORAL_TABLET | ORAL | Status: DC

## 2015-06-01 MED ORDER — ATORVASTATIN CALCIUM 20 MG PO TABS: 20.0000 mg | ORAL_TABLET | Freq: Every day | ORAL | Status: AC

## 2015-06-01 MED ORDER — MOMETASONE FUROATE 0.1 % EX OINT: TOPICAL_OINTMENT | Freq: Every day | CUTANEOUS | Status: AC

## 2015-06-01 NOTE — Patient Instructions (Signed)

## 2015-06-01 NOTE — Progress Notes (Signed)
Subjective:  Patient ID: [name], male    DOB: 02/09/1956  Age: 59 y.o. MRN: [id number]  CC: Hypertension and Hyperlipidemia   HPI [name] presents for follow - he complains of persistent unchanged fatigue and SOB as well as chronic anxiety and itchy rash.  Outpatient Prescriptions Prior to Visit  Medication Sig Dispense Refill  . amLODipine (NORVASC) 10 MG tablet Take 1 tablet (10 mg total) by mouth daily. 180 tablet 3  . Aspirin-Acetaminophen-Caffeine (EXCEDRIN PO) Take 2 tablets by mouth 4 (four) times daily.    Marland Kitchen telmisartan-hydrochlorothiazide (MICARDIS HCT) 40-12.5 MG per tablet TAKE ONE TABLET BY MOUTH ONCE DAILY 90 tablet 0  . ALPRAZolam (XANAX) 0.5 MG tablet TAKE ONE-HALF TO ONE TABLET BY MOUTH TWICE DAILY AS NEEDED FOR SLEEP 60 tablet 2  . omeprazole (PRILOSEC) 40 MG capsule Take 40 mg by mouth 2 (two) times daily.    . mometasone (ELOCON) 0.1 % ointment Apply topically daily. 45 g 3   No facility-administered medications prior to visit.    ROS Review of Systems  Constitutional: Positive for fatigue. Negative for fever, chills, diaphoresis, activity change, appetite change and unexpected weight change.  HENT: Negative.   Eyes: Negative.   Respiratory: Positive for shortness of breath. Negative for apnea, cough, choking, chest tightness, wheezing and stridor.   Cardiovascular: Negative.  Negative for chest pain, palpitations and leg swelling.  Gastrointestinal: Negative.  Negative for nausea, vomiting, abdominal pain, diarrhea, constipation and blood in stool.  Endocrine: Negative.   Genitourinary: Negative.   Musculoskeletal: Negative.   Skin: Positive for rash (itchy areas on BLE). Negative for color change, pallor and wound.  Allergic/Immunologic: Negative.   Neurological: Negative.  Negative for dizziness, syncope, speech difficulty, weakness, light-headedness and headaches.  Hematological: Negative.   Psychiatric/Behavioral: Negative for suicidal ideas,  hallucinations, behavioral problems, confusion, sleep disturbance, self-injury, dysphoric mood, decreased concentration and agitation. The patient is nervous/anxious. The patient is not hyperactive.     Objective:  BP 138/78 mmHg  Pulse 66  Temp(Src) 98.2 F (36.8 C) (Oral)  Resp 14  Ht 5\' 7"  (1.702 m)  Wt 192 lb (87.091 kg)  BMI 30.06 kg/m2  SpO2 97%  BP Readings from Last 3 Encounters:  06/01/15 138/78  11/10/14 152/84  08/03/14 134/68    Wt Readings from Last 3 Encounters:  06/01/15 192 lb (87.091 kg)  11/10/14 192 lb 2 oz (87.147 kg)  08/03/14 193 lb 6.4 oz (87.726 kg)    Physical Exam  Constitutional: He is oriented to person, place, and time. No distress.  HENT:  Mouth/Throat: Oropharynx is clear and moist. No oropharyngeal exudate.  Eyes: Conjunctivae are normal. Right eye exhibits no discharge. Left eye exhibits no discharge. No scleral icterus.  Neck: Normal range of motion. Neck supple. No JVD present. No tracheal deviation present. No thyromegaly present.  Cardiovascular: Normal rate, regular rhythm, normal heart sounds and intact distal pulses.  Exam reveals no gallop and no friction rub.   No murmur heard. Pulmonary/Chest: Effort normal and breath sounds normal. No stridor. No respiratory distress. He has no wheezes. He has no rales. He exhibits no tenderness.  Abdominal: Soft. Bowel sounds are normal. He exhibits no distension and no mass. There is no tenderness. There is no rebound and no guarding.  Musculoskeletal: Normal range of motion. He exhibits no edema or tenderness.  Lymphadenopathy:    He has no cervical adenopathy.  Neurological: He is oriented to person, place, and time.  Skin: Skin is warm  and dry. Rash noted. He is not diaphoretic. No erythema. No pallor.     Psychiatric: He has a normal mood and affect. His behavior is normal. Judgment and thought content normal.  Vitals reviewed.   Lab Results  Component Value Date   WBC 6.6 06/01/2015     HGB 13.9 06/01/2015   HCT 40.8 06/01/2015   PLT 310.0 06/01/2015   GLUCOSE 77 06/01/2015   CHOL 191 06/01/2015   TRIG 143.0 06/01/2015   HDL 40.00 06/01/2015   LDLCALC 122* 06/01/2015   ALT 12 06/01/2015   AST 19 06/01/2015   NA 138 06/01/2015   K 3.7 06/01/2015   CL 106 06/01/2015   CREATININE 1.56* 06/01/2015   BUN 16 06/01/2015   CO2 23 06/01/2015   TSH 0.71 06/01/2015   PSA 0.46 03/17/2013    US Abdomen Complete  11/09/2013   CLINICAL DATA:  Right upper quadrant pain.  EXAM: ULTRASOUND ABDOMEN COMPLETE  COMPARISON:  None.  FINDINGS: Gallbladder:  No gallstones or wall thickening visualized. No sonographic Murphy sign noted.  Common bile duct:  Diameter: 3 mm, within normal limits.  Liver:  No focal lesion identified. Within normal limits in parenchymal echogenicity.  IVC:  No abnormality visualized.  Pancreas:  Visualization is limited by bowel gas.  Spleen:  5.8 cm, negative.  Right Kidney:  Length: 10.9 cm. Parenchymal echogenicity is within normal limits. No hydronephrosis. No focal lesion.  Left Kidney:  Length: 10.8 cm. Parenchymal echogenicity is within normal limits. No hydronephrosis. No focal lesion.  Abdominal aorta:  Atherosclerotic but not aneurysmal.  Other findings:  None.  IMPRESSION: No findings to explain the patient's right upper quadrant pain.   Electronically Signed   By: Lorin Picket M.D.   On: 11/09/2013 09:25    Assessment & Plan:   [name] was seen today for hypertension and hyperlipidemia.  Diagnoses and all orders for this visit:  Eczema- will treat with a topical steroid cream. -     mometasone (ELOCON) 0.1 % ointment; Apply topically daily.  Essential hypertension, benign-his blood pressure is well-controlled, electrolytes and renal function are stable. -     Comprehensive metabolic panel; Future -     CBC with Differential/Platelet; Future  GAD (generalized anxiety disorder) -     ALPRAZolam (XANAX) 0.5 MG tablet; TAKE ONE-HALF TO ONE TABLET  BY MOUTH TWICE DAILY AS NEEDED FOR SLEEP  Hyperlipidemia with target LDL less than 100-his Framingham risk score is 12% so I have asked to start a statin and an aspirin a day. -     Lipid panel; Future -     TSH; Future -     atorvastatin (LIPITOR) 20 MG tablet; Take 1 tablet (20 mg total) by mouth daily. -     aspirin EC 81 MG tablet; Take 1 tablet (81 mg total) by mouth daily.   I am having [name] start on atorvastatin and aspirin EC. I am also having him maintain his omeprazole, Aspirin-Acetaminophen-Caffeine (EXCEDRIN PO), telmisartan-hydrochlorothiazide, amLODipine, mometasone, and ALPRAZolam.  Meds ordered this encounter  Medications  . mometasone (ELOCON) 0.1 % ointment    Sig: Apply topically daily.    Dispense:  45 g    Refill:  3  . ALPRAZolam (XANAX) 0.5 MG tablet    Sig: TAKE ONE-HALF TO ONE TABLET BY MOUTH TWICE DAILY AS NEEDED FOR SLEEP    Dispense:  60 tablet    Refill:  2  . atorvastatin (LIPITOR) 20 MG tablet  Sig: Take 1 tablet (20 mg total) by mouth daily.    Dispense:  90 tablet    Refill:  3  . aspirin EC 81 MG tablet    Sig: Take 1 tablet (81 mg total) by mouth daily.    Dispense:  90 tablet    Refill:  3     Follow-up: Return in about 4 months (around 10/01/2015).  Scarlette Calico, MD

## 2015-06-01 NOTE — Progress Notes (Signed)
Pre visit review using our clinic review tool, if applicable. No additional management support is needed unless otherwise documented below in the visit note. 

## 2015-08-08 ENCOUNTER — Other Ambulatory Visit: Payer: Self-pay | Admitting: Cardiology

## 2015-08-08 DIAGNOSIS — I5022 Chronic systolic (congestive) heart failure: Secondary | ICD-10-CM

## 2015-08-09 ENCOUNTER — Ambulatory Visit (HOSPITAL_COMMUNITY): Payer: BLUE CROSS/BLUE SHIELD | Attending: Internal Medicine

## 2015-08-09 ENCOUNTER — Other Ambulatory Visit: Payer: Self-pay

## 2015-08-09 DIAGNOSIS — Z87891 Personal history of nicotine dependence: Secondary | ICD-10-CM | POA: Insufficient documentation

## 2015-08-09 DIAGNOSIS — I071 Rheumatic tricuspid insufficiency: Secondary | ICD-10-CM | POA: Insufficient documentation

## 2015-08-09 DIAGNOSIS — I5022 Chronic systolic (congestive) heart failure: Secondary | ICD-10-CM | POA: Insufficient documentation

## 2015-08-09 DIAGNOSIS — I10 Essential (primary) hypertension: Secondary | ICD-10-CM | POA: Diagnosis not present

## 2015-08-09 DIAGNOSIS — I517 Cardiomegaly: Secondary | ICD-10-CM | POA: Insufficient documentation

## 2015-08-09 DIAGNOSIS — I340 Nonrheumatic mitral (valve) insufficiency: Secondary | ICD-10-CM | POA: Insufficient documentation

## 2015-08-16 ENCOUNTER — Encounter: Payer: Self-pay | Admitting: Cardiology

## 2015-08-16 ENCOUNTER — Ambulatory Visit (INDEPENDENT_AMBULATORY_CARE_PROVIDER_SITE_OTHER): Payer: BLUE CROSS/BLUE SHIELD | Admitting: Cardiology

## 2015-08-16 ENCOUNTER — Telehealth: Payer: Self-pay | Admitting: Cardiology

## 2015-08-16 VITALS — BP 122/80 | HR 64 | Ht 67.0 in | Wt 195.0 lb

## 2015-08-16 DIAGNOSIS — I517 Cardiomegaly: Secondary | ICD-10-CM | POA: Diagnosis not present

## 2015-08-16 DIAGNOSIS — R0609 Other forms of dyspnea: Secondary | ICD-10-CM | POA: Diagnosis not present

## 2015-08-16 DIAGNOSIS — J410 Simple chronic bronchitis: Secondary | ICD-10-CM | POA: Diagnosis not present

## 2015-08-16 DIAGNOSIS — I10 Essential (primary) hypertension: Secondary | ICD-10-CM | POA: Diagnosis not present

## 2015-08-16 DIAGNOSIS — J449 Chronic obstructive pulmonary disease, unspecified: Secondary | ICD-10-CM | POA: Insufficient documentation

## 2015-08-16 MED ORDER — TIOTROPIUM BROMIDE MONOHYDRATE 18 MCG IN CAPS: 18.0000 ug | ORAL_CAPSULE | Freq: Every day | RESPIRATORY_TRACT | Status: DC

## 2015-08-16 MED ORDER — MOMETASONE FURO-FORMOTEROL FUM 100-5 MCG/ACT IN AERO: 2.0000 | INHALATION_SPRAY | Freq: Two times a day (BID) | RESPIRATORY_TRACT | Status: DC

## 2015-08-16 NOTE — Telephone Encounter (Signed)
We can try [facility], also he can try to contact his insurance what would be the cheapest option for him

## 2015-08-16 NOTE — Progress Notes (Signed)
Patient ID: [name], male   DOB: [birth year], 59 y.o.   MRN: [id number]    Patient Name: [name] Date of Encounter: 08/16/2015  Primary Care Provider:  Scarlette Calico, MD Primary Cardiologist:  Dorothy Spark  Problem List   Past Medical History  Diagnosis Date  . GERD (gastroesophageal reflux disease)   . Arthritis   . History of blood transfusion   . Allergy   . Essential hypertension, benign 03/17/2013  . LVH (left ventricular hypertrophy) due to hypertensive disease 07/07/2014  . Benign paroxysmal positional vertigo 10/06/2013    Jan 2015  MPRESSION: 1. No acute intracranial abnormality. 2. 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 3. Evidence of chronic right maxillary sinusitis. Possible superimposed mild acute sinusitis    . Diastolic dysfunction 8/50/2774  . Dyspnea 09/15/2013  . Ejection fraction < 50% 10/14/2013    Study Conclusions  - Left ventricle: The cavity size was normal. Wall thickness was increased in a pattern of moderate LVH. There was focal basal hypertrophy. Systolic function was normal. The estimated ejection fraction was in the range of 50% to 55%. Wall motion was normal; there were no regional wall motion abnormalities. - Left atrium: The atrium was mildly dilated. - Right atrium: The atrium was mildly dilated Overall Impression: Low risk stress nuclear study Thinning of the inferior wall not thought to be significant EF mildly depressed suggest MRI/Echo correlation .  LV Ejection Fraction: 49%. LV Wall Motion: Normal Wall Motion or Mildly decreased EF diffuse    . GAD (generalized anxiety disorder) 03/17/2013  . Snoring 03/17/2013   No past surgical history on file.  Allergies  Allergies  Allergen Reactions  . Lisinopril     cough  . Tramadol     insomnia    HPI  A pleasant 59 year old male with h/o hypertension - long standing untreated, started on antihypertensives 1 year ago. One year  ago he developed exertional chest pain and SOB and underwent stress testing. He couldn't walk - poor functional capacity, so switched to [place].  Echocardiogram showed preserved LVEF, impaired relaxation and moderate LVH. He is very concerned about his LVH and wants to know what can be done to improve it. Exertional CP and SOB has improved, but he feels fatigued all the time, worse than before. He quit smoking 1 year ago.  The patient complains of cold weather intolerance - with hands and feet turning cold and blue. He also experiences claudications after walking a short distance. No palpitations, syncope. No orthopnea or PND.   08/03/2014 - the patient is coming after 1 month, he tolerates amlodipine well and states that he feels less tired. He stays active working with stable DOE. He still experiences significant bluish discoloration of his fingers and toes when exposed to cold.  08/16/2015 - the patient is coming after 1 year, he is overall feeling ok, no CP, but continues having DOE, he states that he is unable to speak after walking 1 flight of stairs. He feels it might be slightly worse over time. Stopped smoking 2 years ago. He is not using any inhalers.   Home Medications  Prior to Admission medications   Medication Sig Start Date End Date Taking? Authorizing Provider  ALPRAZolam Duanne Moron) 0.5 MG tablet Take 1 tablet (0.5 mg total) by mouth 2 (two) times daily as needed for sleep. Take 1/2 to 1 tablets BID 05/14/14   Janith Lima, MD  Aspirin-Acetaminophen-Caffeine (  EXCEDRIN PO) Take 2 tablets by mouth 4 (four) times daily.    Historical Provider, MD  nebivolol (BYSTOLIC) 10 MG tablet Take 1 tablet (10 mg total) by mouth daily. 10/06/13   Janith Lima, MD  omeprazole (PRILOSEC) 40 MG capsule Take 40 mg by mouth 2 (two) times daily.    Historical Provider, MD  telmisartan-hydrochlorothiazide (MICARDIS HCT) 40-12.5 MG per tablet TAKE ONE TABLET BY MOUTH ONCE DAILY 06/24/14   Janith Lima,  MD    Family History  Family History  Problem Relation Age of Onset  . Arthritis Other   . Diabetes Other   . Stroke Mother   . Hypertension Other   . Asthma Father   . Stroke Mother   . Hypertension Mother   . Diabetes Father   . Lung cancer Brother     was a smoker  . Emphysema Father     was a smoker    Social History  Social History   Social History  . Marital Status: Single    Spouse Name: N/A  . Number of Children: N/A  . Years of Education: N/A   Occupational History  . Not on file.   Social History Main Topics  . Smoking status: Former Smoker -- 1.00 packs/day for 40 years    Types: Cigarettes    Start date: 03/17/1972    Quit date: 06/17/2013  . Smokeless tobacco: Never Used  . Alcohol Use: No  . Drug Use: No  . Sexual Activity:    Partners: Female   Other Topics Concern  . Not on file   Social History Narrative     Review of Systems, as per HPI, otherwise negative General:  No chills, fever, night sweats or weight changes.  Cardiovascular:  No chest pain, dyspnea on exertion, edema, orthopnea, palpitations, paroxysmal nocturnal dyspnea. Dermatological: No rash, lesions/masses Respiratory: No cough, dyspnea Urologic: No hematuria, dysuria Abdominal:   No nausea, vomiting, diarrhea, bright red blood per rectum, melena, or hematemesis Neurologic:  No visual changes, wkns, changes in mental status. All other systems reviewed and are otherwise negative except as noted above.  Physical Exam  Blood pressure 122/80, pulse 64, height 5\' 7"  (1.702 m), weight 195 lb (88.451 kg).  General: Pleasant, NAD Psych: Normal affect. Neuro: Alert and oriented X 3. Moves all extremities spontaneously. HEENT: Normal  Neck: Supple without bruits or JVD. Lungs:  Resp regular and unlabored, CTA. Heart: RRR no s3, s4, or murmurs. Abdomen: Soft, non-tender, non-distended, BS + x 4.  Extremities: No clubbing, cyanosis or edema. DP/PT weak B/L, Radials 2+ and equal  bilaterally.  Labs:  No results for input(s): CKTOTAL, CKMB, TROPONINI in the last 72 hours. Lab Results  Component Value Date   WBC 6.6 06/01/2015   HGB 13.9 06/01/2015   HCT 40.8 06/01/2015   MCV 94.6 06/01/2015   PLT 310.0 06/01/2015    Lab Results  Component Value Date   DDIMER 0.28 09/15/2013   Invalid input(s): POCBNP    Component Value Date/Time   NA 138 06/01/2015 1109   K 3.7 06/01/2015 1109   CL 106 06/01/2015 1109   CO2 23 06/01/2015 1109   GLUCOSE 77 06/01/2015 1109   BUN 16 06/01/2015 1109   CREATININE 1.56* 06/01/2015 1109   CALCIUM 9.2 06/01/2015 1109   PROT 7.3 06/01/2015 1109   ALBUMIN 4.0 06/01/2015 1109   AST 19 06/01/2015 1109   ALT 12 06/01/2015 1109   ALKPHOS 97 06/01/2015 1109   BILITOT  0.3 06/01/2015 1109   Lab Results  Component Value Date   CHOL 191 06/01/2015   HDL 40.00 06/01/2015   LDLCALC 122* 06/01/2015   TRIG 143.0 06/01/2015    Accessory Clinical Findings  Echocardiogram - 11/20/2013 Left ventricle: The cavity size was normal. Wall thickness was increased in a pattern of moderate LVH. There was focal basal hypertrophy. Systolic function was normal. The estimated ejection fraction was in the range of 50% to 55%. Wall motion was normal; there were no regional wall motion abnormalities. - Left atrium: The atrium was mildly dilated. - Right atrium: The atrium was mildly dilated.  ECG - SR, RBBB  Exercise nuclear stress test: 10/13/2013 Impression Exercise Capacity:  Lexiscan with no exercise. BP Response:  Normal blood pressure response. Clinical Symptoms:  There is dyspnea. ECG Impression:  No significant ST segment change suggestive of ischemia. Comparison with Prior Nuclear Study: No images to compare  Overall Impression:  Low risk stress nuclear study Thinning of the inferior wall not thought to be significant EF mildly depressed suggest MRI/Echo correlation .  LV Ejection Fraction: 49%.  LV Wall Motion:  Normal Wall  Motion or Mildly decreased EF diffuse   Assessment & Plan  59 year old male   1. DOE - negative stress test in 2014, he has COPD and is not using any inhalers, I will start Spiriva and see him in 2 months, if no improvement consider coronary CT.  2. LVH - moderate with impaired relaxation, I personally reviewed his echocardiogram and there is no suspicion for infiltrative cardiomyopathy, there is concentric LVH that is most certainly a result of long standing hypertension. He was explained that it is reversible with good BP control.  3. Hypertension - well controlled, however significant fatigue, improved with amlodipine 10 mg PO daily.  Continue telmisartan- HCTZ  Follow up in 2 months.  Dorothy Spark, MD, [facility] 08/16/2015, 9:06 AM

## 2015-08-16 NOTE — Telephone Encounter (Signed)
Contacted the pt back to inform him that per Dr Meda Coffee we can d/c his spiriva, due to the cost will be $300 a month for the pt to have this inhaler, and start him on another inhaler Dulera, instead.  Contacted our Pharmacist for dosing of Dulera and the pt should start out taking 100-13mcg/act aero, inhale 2 puffs into the lungs, 2 times daily.  Informed the pt of this and informed him that its unsure what the cost of this inhaler will be, through his insurance.  Informed the pt that I will send this new inhaler order to his pharmacy of choice.  Per the pt, he states that he will contact our office back if this medication is too expensive, and contact his PCP to further advise on an inhaler regimen, for he states they are a little more familiar with his COPD and they may have samples on hand.  Informed the pt that I will make Dr Meda Coffee aware of this.

## 2015-08-16 NOTE — Patient Instructions (Signed)
Medication Instructions:   START USING SPIRIVA INHALER 18 MCG ONCE DAILY      Follow-Up:  2 MONTHS WITH DR Meda Coffee       If you need a refill on your cardiac medications before your next appointment, please call your pharmacy.

## 2015-08-16 NOTE — Telephone Encounter (Signed)
Pt stated that he was seen today and Dr. Meda Coffee prescribed him Spiriva inhaler 18 mcg daily and pt stated that this medication was too expensive and that Dr. Meda Coffee wanted him to call back to let her know. I informed the pt that I would send this message to Dr. Meda Coffee and her nurse Karlene Einstein, LPN and that someone would get back in contact with him. Pt verbalized understanding. Please advised

## 2015-09-13 ENCOUNTER — Encounter: Payer: Self-pay | Admitting: Adult Health

## 2015-09-13 ENCOUNTER — Ambulatory Visit (INDEPENDENT_AMBULATORY_CARE_PROVIDER_SITE_OTHER): Payer: BLUE CROSS/BLUE SHIELD | Admitting: Adult Health

## 2015-09-13 VITALS — BP 132/80 | HR 76 | Temp 98.7°F | Ht 67.0 in | Wt 193.0 lb

## 2015-09-13 DIAGNOSIS — R05 Cough: Secondary | ICD-10-CM | POA: Diagnosis not present

## 2015-09-13 DIAGNOSIS — R059 Cough, unspecified: Secondary | ICD-10-CM

## 2015-09-13 DIAGNOSIS — R0602 Shortness of breath: Secondary | ICD-10-CM | POA: Diagnosis not present

## 2015-09-13 MED ORDER — DOXYCYCLINE HYCLATE 100 MG PO CAPS: 100.0000 mg | ORAL_CAPSULE | Freq: Two times a day (BID) | ORAL | Status: DC

## 2015-09-13 MED ORDER — HYDROCODONE-HOMATROPINE 5-1.5 MG/5ML PO SYRP
5.0000 mL | ORAL_SOLUTION | Freq: Three times a day (TID) | ORAL | Status: DC | PRN
Start: 2015-09-13 — End: 2015-09-21

## 2015-09-13 NOTE — Progress Notes (Addendum)
   Subjective:    Patient ID: [name], male    DOB: 1956-04-28, 59 y.o.   MRN: [id number]  HPI  59 year old male who presents to the office today for an acute issue of shortness of breath. He endorses that over the last 10 days he has been suffering from a dry cough, chest congestion " you could hear the rattling in my chest", low grade subjective fevers, fatigue, body aches, head aches, sinus pain and pressure and ear pain. The last two days have been the worst.   He has not tried anything over the counter except for Flonase and normal saline spray.   Denies any recent abx use or travel.   No n/v/d/sore throat.   Review of Systems  Constitutional: Positive for fever, chills, diaphoresis, activity change and fatigue. Negative for appetite change and unexpected weight change.  HENT: Positive for congestion, ear pain and sinus pressure. Negative for ear discharge, postnasal drip, rhinorrhea, sore throat and trouble swallowing.   Eyes: Negative.   Respiratory: Positive for cough, choking, chest tightness, shortness of breath, wheezing and stridor. Negative for apnea.   Cardiovascular: Negative.   Gastrointestinal: Negative.   Neurological: Positive for headaches. Negative for dizziness and light-headedness.  Hematological: Negative.   Psychiatric/Behavioral: Positive for sleep disturbance.       Objective:   Physical Exam  Constitutional: He is oriented to person, place, and time. He appears well-developed and well-nourished. No distress.  He appears worn out  Neck: Normal range of motion. Neck supple.  Cardiovascular: Normal rate, regular rhythm, normal heart sounds and intact distal pulses.  Exam reveals no gallop and no friction rub.   No murmur heard. Pulmonary/Chest: Effort normal. No respiratory distress. He has no wheezes. He has no rales. He exhibits no tenderness.  Decreased breath sounds, bilateral bases L > R  Lymphadenopathy:    He has no cervical adenopathy.    Neurological: He is alert and oriented to person, place, and time.  Skin: Skin is warm. No rash noted. He is not diaphoretic. No erythema. No pallor.  Moist skin  Psychiatric: He has a normal mood and affect. His behavior is normal. Thought content normal.  Nursing note and vitals reviewed.      Assessment & Plan:   1. Shortness of breath - Bronchitis vs CAP.  - doxycycline (VIBRAMYCIN) 100 MG capsule; Take 1 capsule (100 mg total) by mouth 2 (two) times daily.  Dispense: 14 capsule; Refill: 0 - DG Chest 2 View; Future - Consider adding prednisone if bronchitis and consider changing abx therapy if CAP 2. Cough - HYDROcodone-homatropine (HYCODAN) 5-1.5 MG/5ML syrup; Take 5 mLs by mouth every 8 (eight) hours as needed for cough.  Dispense: 120 mL; Refill: 0

## 2015-09-13 NOTE — Patient Instructions (Signed)
It was great meeting you today!  I have sent in a prescription for Doxycycline 100mg , take this twice a day for 7 days.   Continue to use Flonase and Normal saline nasal spray  You can use Mucinex during the day for the cough. Use the cough syrup at night as it will make you sleepy  I will call you after your chest xray.

## 2015-09-13 NOTE — Progress Notes (Signed)
Pre visit review using our clinic review tool, if applicable. No additional management support is needed unless otherwise documented below in the visit note. 

## 2015-09-14 ENCOUNTER — Telehealth: Payer: Self-pay | Admitting: Adult Health

## 2015-09-14 ENCOUNTER — Ambulatory Visit (INDEPENDENT_AMBULATORY_CARE_PROVIDER_SITE_OTHER)
Admission: RE | Admit: 2015-09-14 | Discharge: 2015-09-14 | Disposition: A | Discharge status: Home / Self Care | Payer: BLUE CROSS/BLUE SHIELD | Source: Ambulatory Visit | Attending: Adult Health | Admitting: Adult Health

## 2015-09-14 ENCOUNTER — Other Ambulatory Visit: Payer: Self-pay | Admitting: Adult Health

## 2015-09-14 DIAGNOSIS — R0602 Shortness of breath: Secondary | ICD-10-CM | POA: Diagnosis not present

## 2015-09-14 MED ORDER — LEVOFLOXACIN 750 MG PO TABS: 750.0000 mg | ORAL_TABLET | Freq: Every day | ORAL | Status: DC

## 2015-09-14 NOTE — Telephone Encounter (Signed)
Spoke to Mr. [name] on the phone and informed him that he has pneumonia. Due to COPD I will send in Levaquin 750mg  daily. He is to stop doxycyline. Follow up with PCP

## 2015-09-21 ENCOUNTER — Ambulatory Visit (INDEPENDENT_AMBULATORY_CARE_PROVIDER_SITE_OTHER): Payer: BLUE CROSS/BLUE SHIELD | Admitting: Internal Medicine

## 2015-09-21 ENCOUNTER — Ambulatory Visit (INDEPENDENT_AMBULATORY_CARE_PROVIDER_SITE_OTHER)
Admission: RE | Admit: 2015-09-21 | Discharge: 2015-09-21 | Disposition: A | Discharge status: Home / Self Care | Payer: BLUE CROSS/BLUE SHIELD | Source: Ambulatory Visit | Attending: Internal Medicine | Admitting: Internal Medicine

## 2015-09-21 ENCOUNTER — Encounter: Payer: Self-pay | Admitting: Internal Medicine

## 2015-09-21 VITALS — BP 120/74 | HR 63 | Temp 97.6°F | Resp 16 | Ht 67.0 in | Wt 195.0 lb

## 2015-09-21 DIAGNOSIS — J690 Pneumonitis due to inhalation of food and vomit: Secondary | ICD-10-CM

## 2015-09-21 DIAGNOSIS — J418 Mixed simple and mucopurulent chronic bronchitis: Secondary | ICD-10-CM | POA: Diagnosis not present

## 2015-09-21 DIAGNOSIS — J42 Unspecified chronic bronchitis: Secondary | ICD-10-CM | POA: Diagnosis not present

## 2015-09-21 DIAGNOSIS — J189 Pneumonia, unspecified organism: Secondary | ICD-10-CM | POA: Insufficient documentation

## 2015-09-21 MED ORDER — UMECLIDINIUM BROMIDE 62.5 MCG/INH IN AEPB: 1.0000 | INHALATION_SPRAY | Freq: Every day | RESPIRATORY_TRACT | Status: AC

## 2015-09-21 NOTE — Progress Notes (Signed)
Subjective:  Patient ID: [name], male    DOB: 1956/01/06  Age: 59 y.o. MRN: [id number]  CC: Hypertension; Cough; and COPD   HPI [name] presents for follow-up after recent diagnosis of pneumonia. He tells me that a little over a week ago he felt like he choked on an Excedrin tablet and then developed a productive cough. He was seen elsewhere and diagnosed with left lower lobe pneumonia. He took a course of Levaquin and feels better. He has a persistent nonproductive cough but he is not coughing up phlegm anymore. He has chronic shortness of breath that his cardiologist recently attributed to lung disease. He was prescribed Dulera and Spiriva but is unable to afford either one of them. He quit smoking about 2 years ago.  Outpatient Prescriptions Prior to Visit  Medication Sig Dispense Refill  . ALPRAZolam (XANAX) 0.5 MG tablet TAKE ONE-HALF TO ONE TABLET BY MOUTH TWICE DAILY AS NEEDED FOR SLEEP 60 tablet 2  . amLODipine (NORVASC) 10 MG tablet Take 1 tablet (10 mg total) by mouth daily. 180 tablet 3  . aspirin EC 81 MG tablet Take 1 tablet (81 mg total) by mouth daily. 90 tablet 3  . atorvastatin (LIPITOR) 20 MG tablet Take 1 tablet (20 mg total) by mouth daily. 90 tablet 3  . mometasone (ELOCON) 0.1 % ointment Apply topically daily. 45 g 3  . omeprazole (PRILOSEC) 40 MG capsule Take 40 mg by mouth 2 (two) times daily.    Marland Kitchen telmisartan-hydrochlorothiazide (MICARDIS HCT) 40-12.5 MG per tablet TAKE ONE TABLET BY MOUTH ONCE DAILY 90 tablet 0  . Aspirin-Acetaminophen-Caffeine (EXCEDRIN PO) Take 2 tablets by mouth 4 (four) times daily.    Marland Kitchen HYDROcodone-homatropine (HYCODAN) 5-1.5 MG/5ML syrup Take 5 mLs by mouth every 8 (eight) hours as needed for cough. 120 mL 0  . levofloxacin (LEVAQUIN) 750 MG tablet Take 1 tablet (750 mg total) by mouth daily. 7 tablet 0  . mometasone-formoterol (DULERA) 100-5 MCG/ACT AERO Inhale 2 puffs into the lungs 2 (two) times daily. 1 Inhaler 11   No  facility-administered medications prior to visit.    ROS Review of Systems  Constitutional: Negative.  Negative for fever, chills, diaphoresis, appetite change and fatigue.  HENT: Negative.   Eyes: Negative.   Respiratory: Positive for cough and shortness of breath. Negative for apnea, choking, chest tightness, wheezing and stridor.   Cardiovascular: Negative.  Negative for chest pain, palpitations and leg swelling.  Gastrointestinal: Negative.  Negative for nausea, vomiting, abdominal pain, diarrhea, constipation and blood in stool.  Endocrine: Negative.   Genitourinary: Negative.   Musculoskeletal: Negative.   Skin: Negative.  Negative for rash.  Allergic/Immunologic: Negative.   Neurological: Negative.  Negative for dizziness, speech difficulty, weakness and light-headedness.  Hematological: Negative.  Negative for adenopathy. Does not bruise/bleed easily.  Psychiatric/Behavioral: Negative.     Objective:  BP 120/74 mmHg  Pulse 63  Temp(Src) 97.6 F (36.4 C) (Oral)  Resp 16  Ht 5\' 7"  (1.702 m)  Wt 195 lb (88.451 kg)  BMI 30.53 kg/m2  SpO2 97%  BP Readings from Last 3 Encounters:  09/21/15 120/74  09/13/15 132/80  08/16/15 122/80    Wt Readings from Last 3 Encounters:  09/21/15 195 lb (88.451 kg)  09/13/15 193 lb (87.544 kg)  08/16/15 195 lb (88.451 kg)    Physical Exam  Constitutional: He is oriented to person, place, and time. He appears well-developed and well-nourished.  Non-toxic appearance. He does not have a sickly appearance. He  does not appear ill. No distress.  HENT:  Head: Normocephalic and atraumatic.  Mouth/Throat: Oropharynx is clear and moist. No oropharyngeal exudate.  Eyes: Conjunctivae are normal. Right eye exhibits no discharge. Left eye exhibits no discharge. No scleral icterus.  Neck: Normal range of motion. Neck supple. No JVD present. No tracheal deviation present. No thyromegaly present.  Cardiovascular: Normal rate, regular rhythm, normal  heart sounds and intact distal pulses.  Exam reveals no gallop and no friction rub.   No murmur heard. Pulmonary/Chest: Effort normal. No accessory muscle usage or stridor. No tachypnea. No respiratory distress. He has no decreased breath sounds. He has no wheezes. He has rhonchi in the left lower field. He has no rales. He exhibits no tenderness.  Abdominal: Soft. Bowel sounds are normal. He exhibits no distension and no mass. There is no tenderness. There is no rebound and no guarding.  Musculoskeletal: Normal range of motion. He exhibits no edema or tenderness.  Lymphadenopathy:    He has no cervical adenopathy.  Neurological: He is oriented to person, place, and time.  Skin: Skin is warm and dry. No rash noted. He is not diaphoretic. No erythema. No pallor.  Vitals reviewed.   Lab Results  Component Value Date   WBC 6.6 06/01/2015   HGB 13.9 06/01/2015   HCT 40.8 06/01/2015   PLT 310.0 06/01/2015   GLUCOSE 77 06/01/2015   CHOL 191 06/01/2015   TRIG 143.0 06/01/2015   HDL 40.00 06/01/2015   LDLCALC 122* 06/01/2015   ALT 12 06/01/2015   AST 19 06/01/2015   NA 138 06/01/2015   K 3.7 06/01/2015   CL 106 06/01/2015   CREATININE 1.56* 06/01/2015   BUN 16 06/01/2015   CO2 23 06/01/2015   TSH 0.71 06/01/2015   PSA 0.46 03/17/2013    Dg Chest 2 View  09/14/2015  CLINICAL DATA:  59 year old male with cough and shortness of breath for 1 week. Former smoker. Initial encounter. EXAM: CHEST  2 VIEW COMPARISON:  10/2013, 03/18/2013. FINDINGS: Streaky in confluent opacity in the left lower lobe today. No associated pleural effusion. Chronic large lung volumes. Mediastinal contours remain normal. Visualized tracheal air column is within normal limits. No pneumothorax. The right lung is stable. No acute osseous abnormality identified. IMPRESSION: Left lower lobe pneumonia.  No pleural effusion. Followup PA and lateral chest X-ray is recommended in 3-4 weeks following trial of antibiotic  therapy to ensure resolution and exclude underlying malignancy. These results will be called to the ordering clinician or representative by the Radiologist Assistant, and communication documented in the PACS or zVision Dashboard. Electronically Signed   By: Genevie Ann M.D.   On: 09/14/2015 09:21    Assessment & Plan:   Kastiel was seen today for hypertension, cough and copd.  Diagnoses and all orders for this visit:  Mixed simple and mucopurulent chronic bronchitis ([place])- he will start Incruse for this. I gave him samples and a co-pay card to get 30 doses for free. -     Umeclidinium Bromide (INCRUSE ELLIPTA) 62.5 MCG/INH AEPB; Inhale 1 puff into the lungs daily.  Unspecified chronic bronchitis ([place])- start incruse -     Umeclidinium Bromide (INCRUSE ELLIPTA) 62.5 MCG/INH AEPB; Inhale 1 puff into the lungs daily.  Aspiration pneumonia of left lower lobe, unspecified aspiration pneumonia type [facility])- based on his review of systems and examination today he is much improved. His chest x-ray shows a resolution of the left lower lobe infiltrate. -     DG  Chest 2 View; Future  I have discontinued Mr. Sieminski Aspirin-Acetaminophen-Caffeine (EXCEDRIN PO), mometasone-formoterol, HYDROcodone-homatropine, and levofloxacin. I am also having him start on Umeclidinium Bromide. Additionally, I am having him maintain his omeprazole, telmisartan-hydrochlorothiazide, amLODipine, mometasone, ALPRAZolam, atorvastatin, and aspirin EC.  Meds ordered this encounter  Medications  . Umeclidinium Bromide (INCRUSE ELLIPTA) 62.5 MCG/INH AEPB    Sig: Inhale 1 puff into the lungs daily.    Dispense:  30 each    Refill:  11     Follow-up: Return in about 3 months (around 12/20/2015).  Scarlette Calico, MD

## 2015-09-21 NOTE — Progress Notes (Signed)
Pre visit review using our clinic review tool, if applicable. No additional management support is needed unless otherwise documented below in the visit note. 

## 2015-09-21 NOTE — Patient Instructions (Signed)

## 2015-09-22 ENCOUNTER — Other Ambulatory Visit: Payer: Self-pay | Admitting: Cardiology

## 2015-10-26 ENCOUNTER — Other Ambulatory Visit: Payer: Self-pay | Admitting: Internal Medicine

## 2015-10-26 ENCOUNTER — Encounter: Payer: Self-pay | Admitting: Cardiology

## 2015-10-26 ENCOUNTER — Ambulatory Visit (INDEPENDENT_AMBULATORY_CARE_PROVIDER_SITE_OTHER): Payer: BLUE CROSS/BLUE SHIELD | Admitting: Cardiology

## 2015-10-26 VITALS — BP 124/64 | HR 64 | Ht 67.0 in | Wt 194.1 lb

## 2015-10-26 DIAGNOSIS — R0600 Dyspnea, unspecified: Secondary | ICD-10-CM

## 2015-10-26 DIAGNOSIS — J410 Simple chronic bronchitis: Secondary | ICD-10-CM

## 2015-10-26 DIAGNOSIS — I119 Hypertensive heart disease without heart failure: Secondary | ICD-10-CM

## 2015-10-26 DIAGNOSIS — R0609 Other forms of dyspnea: Secondary | ICD-10-CM | POA: Diagnosis not present

## 2015-10-26 NOTE — Progress Notes (Signed)
Patient ID: [name], male   DOB: 1956-10-05, 60 y.o.   MRN: [id number]    Patient Name: [name] Date of Encounter: 10/26/2015  Primary Care Provider:  Scarlette Calico, MD Primary Cardiologist:  Dorothy Spark  Problem List   Past Medical History  Diagnosis Date  . GERD (gastroesophageal reflux disease)   . Arthritis   . History of blood transfusion   . Allergy   . Essential hypertension, benign 03/17/2013  . LVH (left ventricular hypertrophy) due to hypertensive disease 07/07/2014  . Benign paroxysmal positional vertigo 10/06/2013    Jan 2015  MPRESSION: 1. No acute intracranial abnormality. 2. 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 3. Evidence of chronic right maxillary sinusitis. Possible superimposed mild acute sinusitis    . Diastolic dysfunction [id number]  . Dyspnea 09/15/2013  . Ejection fraction < 50% 10/14/2013    Study Conclusions  - Left ventricle: The cavity size was normal. Wall thickness was increased in a pattern of moderate LVH. There was focal basal hypertrophy. Systolic function was normal. The estimated ejection fraction was in the range of 50% to 55%. Wall motion was normal; there were no regional wall motion abnormalities. - Left atrium: The atrium was mildly dilated. - Right atrium: The atrium was mildly dilated Overall Impression: Low risk stress nuclear study Thinning of the inferior wall not thought to be significant EF mildly depressed suggest MRI/Echo correlation .  LV Ejection Fraction: 49%. LV Wall Motion: Normal Wall Motion or Mildly decreased EF diffuse    . GAD (generalized anxiety disorder) 03/17/2013  . Snoring 03/17/2013   No past surgical history on file.  Allergies  Allergies  Allergen Reactions  . Lisinopril     cough  . Tramadol     insomnia   Chief complain: SOB  HPI  A pleasant 60 year old male with h/o hypertension - long standing untreated, started on antihypertensives 1  year ago. One year ago he developed exertional chest pain and SOB and underwent stress testing. He couldn't walk - poor functional capacity, so switched to [place].  Echocardiogram showed preserved LVEF, impaired relaxation and moderate LVH. He is very concerned about his LVH and wants to know what can be done to improve it. Exertional CP and SOB has improved, but he feels fatigued all the time, worse than before. He quit smoking 1 year ago.  The patient complains of cold weather intolerance - with hands and feet turning cold and blue. He also experiences claudications after walking a short distance. No palpitations, syncope. No orthopnea or PND.   08/03/2014 - the patient is coming after 2 months, he was prescribed spiriva at the last visit that he couldn't afford, switched to [facility], his SOB has improved. He had an aspiration pneumonia in December that he was treated for. No chest pain, no palpitations, orthopnea, PND or syncope.    Home Medications  Prior to Admission medications   Medication Sig Start Date End Date Taking? Authorizing Provider  ALPRAZolam Duanne Moron) 0.5 MG tablet Take 1 tablet (0.5 mg total) by mouth 2 (two) times daily as needed for sleep. Take 1/2 to 1 tablets BID 05/14/14   Janith Lima, MD  Aspirin-Acetaminophen-Caffeine (EXCEDRIN PO) Take 2 tablets by mouth 4 (four) times daily.    Historical Provider, MD  nebivolol (BYSTOLIC) 10 MG tablet Take 1 tablet (10 mg total) by mouth daily. 10/06/13   Janith Lima, MD  omeprazole (PRILOSEC) 40 MG  capsule Take 40 mg by mouth 2 (two) times daily.    Historical Provider, MD  telmisartan-hydrochlorothiazide (MICARDIS HCT) 40-12.5 MG per tablet TAKE ONE TABLET BY MOUTH ONCE DAILY 06/24/14   Janith Lima, MD    Family History  Family History  Problem Relation Age of Onset  . Arthritis Other   . Diabetes Other   . Stroke Mother   . Hypertension Other   . Asthma Father   . Stroke Mother   . Hypertension Mother   . Diabetes  Father   . Lung cancer Brother     was a smoker  . Emphysema Father     was a smoker    Social History  Social History   Social History  . Marital Status: Single    Spouse Name: N/A  . Number of Children: N/A  . Years of Education: N/A   Occupational History  . Not on file.   Social History Main Topics  . Smoking status: Former Smoker -- 1.00 packs/day for 40 years    Types: Cigarettes    Start date: 03/17/1972    Quit date: 06/17/2013  . Smokeless tobacco: Never Used  . Alcohol Use: No  . Drug Use: No  . Sexual Activity:    Partners: Female   Other Topics Concern  . Not on file   Social History Narrative     Review of Systems, as per HPI, otherwise negative General:  No chills, fever, night sweats or weight changes.  Cardiovascular:  No chest pain, dyspnea on exertion, edema, orthopnea, palpitations, paroxysmal nocturnal dyspnea. Dermatological: No rash, lesions/masses Respiratory: No cough, dyspnea Urologic: No hematuria, dysuria Abdominal:   No nausea, vomiting, diarrhea, bright red blood per rectum, melena, or hematemesis Neurologic:  No visual changes, wkns, changes in mental status. All other systems reviewed and are otherwise negative except as noted above.  Physical Exam  Blood pressure 124/64, pulse 64, height 5\' 7"  (1.702 m), weight 194 lb 1.9 oz (88.052 kg).  General: Pleasant, NAD Psych: Normal affect. Neuro: Alert and oriented X 3. Moves all extremities spontaneously. HEENT: Normal  Neck: Supple without bruits or JVD. Lungs:  Resp regular and unlabored, CTA. Heart: RRR no s3, s4, or murmurs. Abdomen: Soft, non-tender, non-distended, BS + x 4.  Extremities: No clubbing, cyanosis or edema. DP/PT weak B/L, Radials 2+ and equal bilaterally.  Labs:  No results for input(s): CKTOTAL, CKMB, TROPONINI in the last 72 hours. Lab Results  Component Value Date   WBC 6.6 06/01/2015   HGB 13.9 06/01/2015   HCT 40.8 06/01/2015   MCV 94.6 06/01/2015    PLT 310.0 06/01/2015    Lab Results  Component Value Date   DDIMER 0.28 09/15/2013   Invalid input(s): POCBNP    Component Value Date/Time   NA 138 06/01/2015 1109   K 3.7 06/01/2015 1109   CL 106 06/01/2015 1109   CO2 23 06/01/2015 1109   GLUCOSE 77 06/01/2015 1109   BUN 16 06/01/2015 1109   CREATININE 1.56* 06/01/2015 1109   CALCIUM 9.2 06/01/2015 1109   PROT 7.3 06/01/2015 1109   ALBUMIN 4.0 06/01/2015 1109   AST 19 06/01/2015 1109   ALT 12 06/01/2015 1109   ALKPHOS 97 06/01/2015 1109   BILITOT 0.3 06/01/2015 1109   Lab Results  Component Value Date   CHOL 191 06/01/2015   HDL 40.00 06/01/2015   LDLCALC 122* 06/01/2015   TRIG 143.0 06/01/2015    Accessory Clinical Findings  Echocardiogram - 11/20/2013 Left  ventricle: The cavity size was normal. Wall thickness was increased in a pattern of moderate LVH. There was focal basal hypertrophy. Systolic function was normal. The estimated ejection fraction was in the range of 50% to 55%. Wall motion was normal; there were no regional wall motion abnormalities. - Left atrium: The atrium was mildly dilated. - Right atrium: The atrium was mildly dilated.  TTE: 07/2015 Left ventricle: The cavity size was normal. Wall thickness was increased in a pattern of moderate LVH. Systolic function was normal. The estimated ejection fraction was in the range of 55% to 60%. Wall motion was normal; there were no regional wall motion abnormalities. Doppler parameters are consistent with abnormal left ventricular relaxation (grade 1 diastolic dysfunction). The E/e&' ratio is between 8-15, suggesting indeterminate LV filling pressure. - Mitral valve: Mildly thickened leaflets . There was mild regurgitation. - Left atrium: The atrium was mildly dilated at 37 ml/m2. - Tricuspid valve: There was mild regurgitation. - Pulmonary arteries: PA peak pressure: 31 mm Hg (S). - Inferior vena cava: The vessel was normal in size.  The respirophasic diameter changes were in the normal range (>= 50%), consistent with normal central venous pressure.  Impressions:  - Compared to a prior echo in 2015, the EF has improved to 55-60%.  ECG - SR, RBBB  Exercise nuclear stress test: 10/13/2013 Impression Exercise Capacity:  Lexiscan with no exercise. BP Response:  Normal blood pressure response. Clinical Symptoms:  There is dyspnea. ECG Impression:  No significant ST segment change suggestive of ischemia. Comparison with Prior Nuclear Study: No images to compare  Overall Impression:  Low risk stress nuclear study Thinning of the inferior wall not thought to be significant EF mildly depressed suggest MRI/Echo correlation .  LV Ejection Fraction: 49%.  LV Wall Motion:  Normal Wall Motion or Mildly decreased EF diffuse   Assessment & Plan  60 year old male   1. DOE - negative stress test in 2014, he has COPD and was not using any inhalers, improved on Dulera.   2. LVH - moderate with impaired relaxation, I personally reviewed his echocardiogram and there is no suspicion for infiltrative cardiomyopathy, there is concentric LVH that is most certainly a result of long standing hypertension. He was explained that it is reversible with good BP control.  3. Hypertensive heart disease without CHF - well controlled, however significant fatigue, improved with amlodipine 10 mg PO daily.  Continue telmisartan- HCTZ  Follow up in 1 year.  Dorothy Spark, MD, [facility] 10/26/2015, 9:06 AM

## 2015-10-26 NOTE — Patient Instructions (Signed)
Your physician recommends that you continue on your current medications as directed. Please refer to the Current Medication list given to you today.   Your physician wants you to follow-up in: ONE YEAR WITH DR NELSON You will receive a reminder letter in the mail two months in advance. If you don't receive a letter, please call our office to schedule the follow-up appointment.  

## 2015-10-27 NOTE — Telephone Encounter (Signed)
faxed

## 2015-12-13 ENCOUNTER — Telehealth: Payer: Self-pay | Admitting: Internal Medicine

## 2015-12-13 NOTE — Telephone Encounter (Signed)
Patient Name: [name]  DOB: 29-Oct-1955    Initial Comment Caller states he has an ingrown toenail, has pus and infection. Both big toes infected.   Nurse Assessment  Nurse: Mallie Mussel, RN, Alveta Heimlich Date/Time Eilene Ghazi Time): 12/13/2015 1:35:44 PM  Confirm and document reason for call. If symptomatic, describe symptoms. You must click the next button to save text entered. ---Caller states that both of his great toenails are are ingrown and have become infected. They both have pus coming from them. The right one is worse than the left one. The toes are sore and painful to touch. Denies fever and denies being a diabetic.  Has the patient traveled out of the country within the last 30 days? ---No  Does the patient have any new or worsening symptoms? ---Yes  Will a triage be completed? ---Yes  Related visit to physician within the last 2 weeks? ---No  Does the PT have any chronic conditions? (i.e. diabetes, asthma, etc.) ---Yes  List chronic conditions. ---HTN, Hypercholesterolemia  Is this a behavioral health or substance abuse call? ---No     Guidelines    Guideline Title Affirmed Question Affirmed Notes  Toenail - Ingrown Entire toe is swollen    Final Disposition User   See Physician within [place], RN, Alveta Heimlich    Referrals  REFERRED TO PCP OFFICE   Disagree/Comply: Leta Baptist

## 2015-12-13 NOTE — Telephone Encounter (Signed)
appt was made for 12/14/15...[name]

## 2015-12-14 ENCOUNTER — Encounter: Payer: Self-pay | Admitting: Family Medicine

## 2015-12-14 ENCOUNTER — Ambulatory Visit (INDEPENDENT_AMBULATORY_CARE_PROVIDER_SITE_OTHER): Payer: BLUE CROSS/BLUE SHIELD | Admitting: Family Medicine

## 2015-12-14 VITALS — BP 128/68 | HR 65 | Temp 97.9°F | Wt 200.0 lb

## 2015-12-14 DIAGNOSIS — L600 Ingrowing nail: Secondary | ICD-10-CM | POA: Diagnosis not present

## 2015-12-14 NOTE — Progress Notes (Signed)
Garret Reddish, MD  Subjective:  [name] is a 60 y.o. year old very pleasant male patient who presents for/with See problem oriented charting ROS- no fever, chills, nausea, vomiting, redness streaking up toe or foot  Past Medical History-  Hypertension, hyperlipidemia, GERD, anxiety, COPD  Medications- reviewed and updated Current Outpatient Prescriptions  Medication Sig Dispense Refill  . amLODipine (NORVASC) 10 MG tablet TAKE ONE TABLET BY MOUTH ONCE DAILY 180 tablet 2  . aspirin EC 81 MG tablet Take 1 tablet (81 mg total) by mouth daily. 90 tablet 3  . atorvastatin (LIPITOR) 20 MG tablet Take 1 tablet (20 mg total) by mouth daily. 90 tablet 3  . mometasone (ELOCON) 0.1 % ointment Apply topically daily. 45 g 3  . omeprazole (PRILOSEC) 40 MG capsule Take 40 mg by mouth 2 (two) times daily.    Marland Kitchen Umeclidinium Bromide (INCRUSE ELLIPTA) 62.5 MCG/INH AEPB Inhale 1 puff into the lungs daily. 30 each 11  . ALPRAZolam (XANAX) 0.5 MG tablet TAKE ONE-HALF TO ONE TABLET BY MOUTH TWICE DAILY AS NEEDED FOR SLEEP (Patient not taking: Reported on 12/14/2015) 60 tablet 2   No current facility-administered medications for this visit.    Objective: BP 128/68 mmHg  Pulse 65  Temp(Src) 97.9 F (36.6 C)  Wt 200 lb (90.719 kg) Gen: NAD, resting comfortably CV: RRR no murmurs rubs or gallops Lungs: CTAB no crackles, wheeze, rhonchi Abdomen: soft/nontender/nondistended/normal bowel sounds. Ext: no edema On bilateral feet has noted ingrown toenail. On left great toe on medial portion there is erythema on skin adjacent to entire nail and tender to touch. The toenail is cut short on both feet. No obvious areas of purulence Skin: warm, dry Neuro: grossly normal, moves all extremities  Assessment/Plan:  Ingrown toenail - Plan: Ambulatory referral to Podiatry S: Ingrown toenail for at least 1-2 months bilaterally. Patient does manipulate nail. Warm soapy baths do help pain. No other treatments tired.  Rates mild to moderate pain making it hard to walk at times. No other treatments tried. Getting worse over last 1-2 weeks. Redness on left foot has become more pronounced A/P: No obvious area of purulence for I+D- chief need is toenail removal. Have referred to podiatry at time for their consideration. Continue warm soaks and conservative care until that time.   Return precautions advised.   Orders Placed This Encounter  Procedures  . Ambulatory referral to Podiatry    Referral Priority:  Routine    Referral Type:  Consultation    Referral Reason:  Specialty Services Required    Requested Specialty:  Podiatry    Number of Visits Requested:  1

## 2015-12-14 NOTE — Patient Instructions (Signed)
We will call you within a week about your referral to podiatry. If you do not hear within 2 weeks, give [place] a call. i asked them to try to get you in within a week.   Ingrown Toenail An ingrown toenail occurs when the corner or sides of your toenail grow into the surrounding skin. The big toe is most commonly affected, but it can happen to any of your toes. If your ingrown toenail is not treated, you will be at risk for infection. CAUSES This condition may be caused by:  Wearing shoes that are too small or tight.  Injury or trauma, such as stubbing your toe or having your toe stepped on.  Improper cutting or care of your toenails.  Being born with (congenital) nail or foot abnormalities, such as having a nail that is too big for your toe. RISK FACTORS Risk factors for an ingrown toenail include:  Age. Your nails tend to thicken as you get older, so ingrown nails are more common in older people.  Diabetes.  Cutting your toenails incorrectly.  Blood circulation problems. SYMPTOMS Symptoms may include:  Pain, soreness, or tenderness.  Redness.  Swelling.  Hardening of the skin surrounding the toe. Your ingrown toenail may be infected if there is fluid, pus, or drainage. DIAGNOSIS  An ingrown toenail may be diagnosed by medical history and physical exam. If your toenail is infected, your health care provider may test a sample of the drainage. TREATMENT Treatment depends on the severity of your ingrown toenail. Some ingrown toenails may be treated at home. More severe or infected ingrown toenails may require surgery to remove all or part of the nail. Infected ingrown toenails may also be treated with antibiotic medicines. HOME CARE INSTRUCTIONS  Soak your foot in warm soapy water for 20 minutes, 3 times per day or as directed by your health care provider.  Carefully lift the edge of the nail away from the sore skin by wedging a small piece of cotton under the corner of the nail.  This may help with the pain. Be careful not to cause more injury to the area.  Wear shoes that fit well. If your ingrown toenail is causing you pain, try wearing sandals, if possible.  Trim your toenails regularly and carefully. Do not cut them in a curved shape. Cut your toenails straight across. This prevents injury to the skin at the corners of the toenail.  Keep your feet clean and dry.  If you are having trouble walking and are given crutches by your health care provider, use them as directed.  Do not pick at your toenail or try to remove it yourself.  Take medicines only as directed by your health care provider.  Keep all follow-up visits as directed by your health care provider. This is important. SEEK MEDICAL CARE IF:  Your symptoms do not improve with treatment. SEEK IMMEDIATE MEDICAL CARE IF:  You have red streaks that start at your foot and go up your leg.  You have a fever.  You have increased redness, swelling, or pain.  You have fluid, blood, or pus coming from your toenail.   This information is not intended to replace advice given to you by your health care provider. Make sure you discuss any questions you have with your health care provider.   Document Released: 09/28/2000 Document Revised: 02/15/2015 Document Reviewed: 08/25/2014 Elsevier Interactive Patient Education [employer].

## 2015-12-28 ENCOUNTER — Encounter: Payer: Self-pay | Admitting: Podiatry

## 2015-12-28 ENCOUNTER — Telehealth: Payer: Self-pay | Admitting: *Deleted

## 2015-12-28 ENCOUNTER — Ambulatory Visit (INDEPENDENT_AMBULATORY_CARE_PROVIDER_SITE_OTHER): Payer: BLUE CROSS/BLUE SHIELD | Admitting: Podiatry

## 2015-12-28 VITALS — BP 145/77 | HR 65 | Resp 16

## 2015-12-28 DIAGNOSIS — M205X9 Other deformities of toe(s) (acquired), unspecified foot: Secondary | ICD-10-CM | POA: Diagnosis not present

## 2015-12-28 DIAGNOSIS — L600 Ingrowing nail: Secondary | ICD-10-CM | POA: Diagnosis not present

## 2015-12-28 NOTE — Progress Notes (Signed)
   Subjective:    Patient ID: [name], male    DOB: 11/24/1955, 60 y.o.   MRN: [id number]  HPI  Pt presents with bilateral hallux ingrown nails, medial border  Review of Systems  All other systems reviewed and are negative.      Objective:   Physical Exam        Assessment & Plan:

## 2015-12-28 NOTE — Telephone Encounter (Signed)
"  I have an appointment there today at 3:30pm.  I have an ingrown toenail.  I don't know if he's going to remove it today or not or have me come back.  If he does remove it will I be able to drive or will I need someone to bring me."  You'll be able to drive.

## 2015-12-28 NOTE — Patient Instructions (Addendum)

## 2015-12-29 NOTE — Progress Notes (Signed)
Subjective:     Patient ID: [name], male   DOB: Oct 27, 1955, 60 y.o.   MRN: [id number]  HPI patient states I have pain in both my big toenails on the inside and it makes it very hard for me to be comfortable. Patient states that he's tried soaks she's tried trimming without relief and it's been going on for a number of years. Also complains about his big toe joint left over right states at times they can really bothering   Review of Systems  All other systems reviewed and are negative.      Objective:   Physical Exam  Constitutional: He is oriented to person, place, and time.  Cardiovascular: Intact distal pulses.   Musculoskeletal: Normal range of motion.  Neurological: He is oriented to person, place, and time.  Skin: Skin is warm.  Nursing note and vitals reviewed. Neurovascular status found to be intact muscle strength adequate range of motion within normal limits with patient noted to have incurvated medial borders the hallux bilateral that are painful when pressed and makes wearing shoe gear difficult. Distal redness but no drainage was noted and good digital perfusion noted. 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 Today were focusing on the nails and I did educate him on hallux limitus first. Discussed nails and procedures along with risk and patient wants surgery and today I infiltrated each hallux 60 mg Xylocaine Marcaine mixture removed the medial borders exposed matrix and applied phenol 3 applications 30 seconds followed by alcohol lavage and sterile dressing. Gave instructions on soaks and reappoint

## 2016-01-10 ENCOUNTER — Telehealth: Payer: Self-pay | Admitting: *Deleted

## 2016-01-10 NOTE — Telephone Encounter (Signed)
Called patient at [phone] (Cell #) to check to see how they were doing from their ingrown toenail procedure that was performed on Wednesday, December 28, 2015. Pt stated, "Toes are okay, but still has a little bit of bleeding". Pt is soaking their toes when they can due to their work schedule. Pt also stated, "Right foot hurts a little bit, but left foot is okay". I recommended to the patient to take ibuprofen for any pain or discomfort they are feeling from their toe. Pt stated they understood.

## 2016-02-06 ENCOUNTER — Ambulatory Visit (INDEPENDENT_AMBULATORY_CARE_PROVIDER_SITE_OTHER): Payer: BLUE CROSS/BLUE SHIELD | Admitting: Podiatry

## 2016-02-06 ENCOUNTER — Encounter: Payer: Self-pay | Admitting: Podiatry

## 2016-02-06 VITALS — BP 147/80 | HR 76 | Resp 16

## 2016-02-06 DIAGNOSIS — M722 Plantar fascial fibromatosis: Secondary | ICD-10-CM

## 2016-02-06 DIAGNOSIS — M8430XA Stress fracture, unspecified site, initial encounter for fracture: Secondary | ICD-10-CM | POA: Diagnosis not present

## 2016-02-06 NOTE — Progress Notes (Signed)
Subjective:     Patient ID: [name], male   DOB: 1956-06-20, 60 y.o.   MRN: [id number]  HPI patient presents stating he's developed severe pain in his right heel of several weeks' duration and is having difficulty in being able to bear weight on it at this time. He has been quite active but no other change in health   Review of Systems     Objective:   Physical Exam Neurovascular status was found to be intact muscle strength was adequate range of motion within normal limits. Patient's found to have severe discomfort in the plantar and posterior heel and within the heel bone itself right with exquisite inflammation fluid buildup upon palpation. There is mild to moderate edema associated with it    Assessment:     Strong possibility for stress fracture of the calcaneus    Plan:     H&P and x-rays reviewed with patient. Today I went ahead and I reviewed with him causes of stress fracture and that even are x-ray currently is not functioning properly most likely he is showing clinical signs of stress fracture. I did place him into a air fracture walker to completely immobilize and gave him advice on ice therapy reduced activity and reappoint again in 3 weeks

## 2016-02-06 NOTE — Patient Instructions (Signed)

## 2016-02-27 ENCOUNTER — Ambulatory Visit (INDEPENDENT_AMBULATORY_CARE_PROVIDER_SITE_OTHER): Payer: BLUE CROSS/BLUE SHIELD

## 2016-02-27 ENCOUNTER — Encounter: Payer: Self-pay | Admitting: Podiatry

## 2016-02-27 ENCOUNTER — Ambulatory Visit (INDEPENDENT_AMBULATORY_CARE_PROVIDER_SITE_OTHER): Payer: BLUE CROSS/BLUE SHIELD | Admitting: Podiatry

## 2016-02-27 VITALS — BP 135/73 | HR 64 | Resp 16

## 2016-02-27 DIAGNOSIS — M79671 Pain in right foot: Secondary | ICD-10-CM

## 2016-02-27 DIAGNOSIS — M8430XA Stress fracture, unspecified site, initial encounter for fracture: Secondary | ICD-10-CM | POA: Diagnosis not present

## 2016-02-27 DIAGNOSIS — M722 Plantar fascial fibromatosis: Secondary | ICD-10-CM

## 2016-02-27 NOTE — Progress Notes (Signed)
Subjective:     Patient ID: [name], male   DOB: 1956/01/03, 60 y.o.   MRN: [id number]  HPI patient presents stating my right heel is feeling quite a bit better   Review of Systems     Objective:   Physical Exam Neurovascular status intact with patient found to have significant improvement in the body of the calcaneus right with pain still noted upon deep palpation    Assessment:     Stress fracture of the right calcaneus with possible plantar fasciitis    Plan:     Advised on physical therapy gradual reduction of boot usage over the next 3-4 weeks and reappoint for [place] to recheck again if symptoms persist over the next month

## 2016-03-16 ENCOUNTER — Other Ambulatory Visit: Payer: Self-pay

## 2016-03-16 ENCOUNTER — Emergency Department (HOSPITAL_COMMUNITY): Payer: BLUE CROSS/BLUE SHIELD

## 2016-03-16 ENCOUNTER — Telehealth: Payer: Self-pay | Admitting: Internal Medicine

## 2016-03-16 ENCOUNTER — Encounter (HOSPITAL_COMMUNITY): Payer: Self-pay | Admitting: Emergency Medicine

## 2016-03-16 ENCOUNTER — Emergency Department (HOSPITAL_COMMUNITY)
Admission: EM | Admit: 2016-03-16 | Discharge: 2016-03-16 | Disposition: A | Discharge status: Home / Self Care | Payer: BLUE CROSS/BLUE SHIELD | Attending: Emergency Medicine | Admitting: Emergency Medicine

## 2016-03-16 DIAGNOSIS — Z87891 Personal history of nicotine dependence: Secondary | ICD-10-CM | POA: Insufficient documentation

## 2016-03-16 DIAGNOSIS — R0602 Shortness of breath: Secondary | ICD-10-CM | POA: Diagnosis present

## 2016-03-16 DIAGNOSIS — Z79899 Other long term (current) drug therapy: Secondary | ICD-10-CM | POA: Diagnosis not present

## 2016-03-16 DIAGNOSIS — J189 Pneumonia, unspecified organism: Secondary | ICD-10-CM | POA: Diagnosis not present

## 2016-03-16 DIAGNOSIS — I10 Essential (primary) hypertension: Secondary | ICD-10-CM | POA: Diagnosis not present

## 2016-03-16 DIAGNOSIS — Z7982 Long term (current) use of aspirin: Secondary | ICD-10-CM | POA: Insufficient documentation

## 2016-03-16 LAB — BASIC METABOLIC PANEL
ANION GAP: 9 (ref 5–15)
BUN: 17 mg/dL (ref 6–20)
CALCIUM: 11.1 mg/dL — AB (ref 8.9–10.3)
CO2: 22 mmol/L (ref 22–32)
Chloride: 106 mmol/L (ref 101–111)
Creatinine, Ser: 1.51 mg/dL — ABNORMAL HIGH (ref 0.61–1.24)
GFR, EST AFRICAN AMERICAN: 57 mL/min — AB (ref >60)
GFR, EST NON AFRICAN AMERICAN: 49 mL/min — AB (ref >60)
Glucose, Bld: 134 mg/dL — ABNORMAL HIGH (ref 65–99)
Potassium: 4 mmol/L (ref 3.5–5.1)
Sodium: 137 mmol/L (ref 135–145)

## 2016-03-16 LAB — D-DIMER, QUANTITATIVE (NOT AT ARMC): D DIMER QUANT: 8.83 ug{FEU}/mL — AB (ref 0.00–0.50)

## 2016-03-16 LAB — CBC
HCT: 40.3 % (ref 39.0–52.0)
HEMOGLOBIN: 13 g/dL (ref 13.0–17.0)
MCH: 30.2 pg (ref 26.0–34.0)
MCHC: 32.3 g/dL (ref 30.0–36.0)
MCV: 93.5 fL (ref 78.0–100.0)
Platelets: 287 10*3/uL (ref 150–400)
RBC: 4.31 MIL/uL (ref 4.22–5.81)
RDW: 14.1 % (ref 11.5–15.5)
WBC: 17.5 10*3/uL — ABNORMAL HIGH (ref 4.0–10.5)

## 2016-03-16 LAB — I-STAT TROPONIN, ED: TROPONIN I, POC: 0 ng/mL (ref 0.00–0.08)

## 2016-03-16 MED ORDER — LEVOFLOXACIN 750 MG PO TABS
750.0000 mg | ORAL_TABLET | Freq: Once | ORAL | Status: AC
  Administered 2016-03-16: 750 mg via ORAL

## 2016-03-16 MED ORDER — LEVOFLOXACIN 750 MG PO TABS
750.0000 mg | ORAL_TABLET | Freq: Once | ORAL | Status: DC
  Filled 2016-03-16: qty 1

## 2016-03-16 MED ORDER — KETOROLAC TROMETHAMINE 30 MG/ML IJ SOLN
15.0000 mg | Freq: Once | INTRAMUSCULAR | Status: AC
  Administered 2016-03-16: 15 mg via INTRAVENOUS
  Filled 2016-03-16: qty 1

## 2016-03-16 MED ORDER — IOPAMIDOL (ISOVUE-370) INJECTION 76%
INTRAVENOUS | Status: AC
  Filled 2016-03-16: qty 100

## 2016-03-16 MED ORDER — SODIUM CHLORIDE 0.9 % IV BOLUS (SEPSIS)
1000.0000 mL | Freq: Once | INTRAVENOUS | Status: AC
  Administered 2016-03-16: 1000 mL via INTRAVENOUS

## 2016-03-16 MED ORDER — LEVOFLOXACIN 750 MG PO TABS: 750.0000 mg | ORAL_TABLET | Freq: Every day | ORAL | Status: AC

## 2016-03-16 MED ORDER — IOPAMIDOL (ISOVUE-370) INJECTION 76%
INTRAVENOUS | Status: AC
  Administered 2016-03-16: 100 mL
  Filled 2016-03-16: qty 100

## 2016-03-16 MED ORDER — IOPAMIDOL (ISOVUE-370) INJECTION 76%
INTRAVENOUS | Status: AC
  Filled 2016-03-16: qty 50

## 2016-03-16 MED ORDER — FENTANYL CITRATE (PF) 100 MCG/2ML IJ SOLN
50.0000 ug | INTRAMUSCULAR | Status: DC | PRN
  Filled 2016-03-16: qty 2

## 2016-03-16 MED ORDER — ACETAMINOPHEN 500 MG PO TABS
1000.0000 mg | ORAL_TABLET | Freq: Once | ORAL | Status: AC
  Administered 2016-03-16: 1000 mg via ORAL
  Filled 2016-03-16: qty 2

## 2016-03-16 NOTE — ED Notes (Signed)
Pt now stating hes not in pain. Will hold fentanyl until pt requests it.

## 2016-03-16 NOTE — Discharge Instructions (Signed)

## 2016-03-16 NOTE — Telephone Encounter (Signed)
Patient Name: [name] DOB: 29-Aug-1956 Initial Comment Having pain on the left side of his ribs, taking a deep breath hurts in his chest to his back. Concerned it is Pleurisy . has had it in the past as well as pneumonia Nurse Assessment Nurse: Ronnald Ramp, RN, [place] Date/Time (Eastern Time): 03/16/2016 10:08:55 AM Confirm and document reason for call. If symptomatic, describe symptoms. You must click the next button to save text entered. ---Caller states he is having pain in his left side that wraps around into his back off and on for 1 week when taking a deep breath. Also with SOB with exertion. It is worse since yesterday and the pain became constant. No known trauma. Has the patient traveled out of the country within the last 30 days? ---Not Applicable Does the patient have any new or worsening symptoms? ---Yes Will a triage be completed? ---Yes Related visit to physician within the last 2 weeks? ---No Does the PT have any chronic conditions? (i.e. diabetes, asthma, etc.) ---Yes List chronic conditions. ---HTN, High Cholesterol, Anxiety, GERD Is this a behavioral health or substance abuse call? ---No Guidelines Guideline Title Affirmed Question Affirmed Notes Chest Pain [1] Intermittent chest pain or "angina" AND [2] increasing in severity or frequency (Exception: pains lasting a few seconds) Final Disposition User Go to ED Now Ronnald Ramp, RN, [facility] - ED Disagree/Comply: Comply

## 2016-03-16 NOTE — ED Provider Notes (Signed)
CSN: [id number]     Arrival date & time 03/16/16  1152 History   First MD Initiated Contact with Patient 03/16/16 1506     Chief Complaint  Patient presents with  . Shortness of Breath  . Chest Pain     (Consider location/radiation/quality/duration/timing/severity/associated sxs/prior Treatment) Patient is a 60 y.o. male presenting with chest pain. The history is provided by the patient.  Chest Pain Pain location:  L lateral chest Pain quality: sharp   Pain radiates to:  Upper back Pain severity:  Moderate Onset quality:  Gradual Duration:  10 days Timing:  Constant Progression:  Unchanged Chronicity:  New Context: breathing and at rest   Relieved by:  Nothing Worsened by:  Deep breathing Ineffective treatments: excedrin. Associated symptoms: shortness of breath   Associated symptoms: no cough, no fever, no lower extremity edema and no syncope   Risk factors: hypertension and smoking (remote history)   Risk factors: no prior DVT/PE     Past Medical History  Diagnosis Date  . GERD (gastroesophageal reflux disease)   . Arthritis   . History of blood transfusion   . Allergy   . Essential hypertension, benign 03/17/2013  . LVH (left ventricular hypertrophy) due to hypertensive disease 07/07/2014  . Benign paroxysmal positional vertigo 10/06/2013    Jan 2015  MPRESSION: 1. No acute intracranial abnormality. 2. 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 3. Evidence of chronic right maxillary sinusitis. Possible superimposed mild acute sinusitis    . Diastolic dysfunction [id number]  . Dyspnea 09/15/2013  . Ejection fraction < 50% 10/14/2013    Study Conclusions  - Left ventricle: The cavity size was normal. Wall thickness was increased in a pattern of moderate LVH. There was focal basal hypertrophy. Systolic function was normal. The estimated ejection fraction was in the range of 50% to 55%. Wall motion was normal; there were  no regional wall motion abnormalities. - Left atrium: The atrium was mildly dilated. - Right atrium: The atrium was mildly dilated Overall Impression: Low risk stress nuclear study Thinning of the inferior wall not thought to be significant EF mildly depressed suggest MRI/Echo correlation .  LV Ejection Fraction: 49%. LV Wall Motion: Normal Wall Motion or Mildly decreased EF diffuse    . GAD (generalized anxiety disorder) 03/17/2013  . Snoring 03/17/2013   History reviewed. No pertinent past surgical history. Family History  Problem Relation Age of Onset  . Arthritis Other   . Diabetes Other   . Stroke Mother   . Hypertension Other   . Asthma Father   . Stroke Mother   . Hypertension Mother   . Diabetes Father   . Lung cancer Brother     was a smoker  . Emphysema Father     was a smoker   Social History  Substance Use Topics  . Smoking status: Former Smoker -- 1.00 packs/day for 40 years    Types: Cigarettes    Start date: 03/17/1972    Quit date: 06/17/2013  . Smokeless tobacco: Never Used  . Alcohol Use: No    Review of Systems  Constitutional: Negative for fever.  Respiratory: Positive for shortness of breath. Negative for cough.   Cardiovascular: Positive for chest pain. Negative for syncope.  All other systems reviewed and are negative.     Allergies  Lisinopril and Tramadol  Home Medications   Prior to Admission medications   Medication Sig Start Date End Date Taking? Authorizing Provider  ALPRAZolam (XANAX) 0.5 MG tablet TAKE ONE-HALF TO ONE TABLET BY MOUTH TWICE DAILY AS NEEDED FOR SLEEP 10/27/15  Yes Janith Lima, MD  amLODipine (NORVASC) 10 MG tablet TAKE ONE TABLET BY MOUTH ONCE DAILY 09/22/15  Yes Dorothy Spark, MD  Aspirin-Acetaminophen-Caffeine (EXCEDRIN PO) Take 2 tablets by mouth 4 (four) times daily.   Yes Historical Provider, MD  atorvastatin (LIPITOR) 20 MG tablet Take 1 tablet (20 mg total) by mouth daily. 06/01/15  Yes Janith Lima, MD  naproxen  sodium (ANAPROX) 220 MG tablet Take 220 mg by mouth as needed (for back pain).   Yes Historical Provider, MD  omeprazole (PRILOSEC) 40 MG capsule Take 40 mg by mouth daily.    Yes Historical Provider, MD  Umeclidinium Bromide (INCRUSE ELLIPTA) 62.5 MCG/INH AEPB Inhale 1 puff into the lungs daily. 09/21/15  Yes Janith Lima, MD  aspirin EC 81 MG tablet Take 1 tablet (81 mg total) by mouth daily. 06/01/15   Janith Lima, MD  mometasone (ELOCON) 0.1 % ointment Apply topically daily. 06/01/15   Janith Lima, MD   BP 165/86 mmHg  Pulse 80  Temp(Src) 98.3 F (36.8 C) (Oral)  Resp 18  Ht 5\' 7"  (1.702 m)  Wt 195 lb (88.451 kg)  BMI 30.53 kg/m2  SpO2 97% Physical Exam  Constitutional: He is oriented to person, place, and time. He appears well-developed and well-nourished. No distress.  HENT:  Head: Normocephalic and atraumatic.  Eyes: Conjunctivae are normal.  Neck: Neck supple. No tracheal deviation present.  Cardiovascular: Normal rate, regular rhythm and normal heart sounds.   Pulmonary/Chest: Effort normal and breath sounds normal. No respiratory distress. He has no wheezes. He has no rales. He exhibits no tenderness.  Abdominal: Soft. He exhibits no distension. There is no tenderness.  Neurological: He is alert and oriented to person, place, and time.  Skin: Skin is warm and dry.  Psychiatric: He has a normal mood and affect.  Vitals reviewed.   ED Course  Procedures (including critical care time) Labs Review Labs Reviewed  BASIC METABOLIC PANEL - Abnormal; Notable for the following:    Glucose, Bld 134 (*)    Creatinine, Ser 1.51 (*)    Calcium 11.1 (*)    GFR calc non Af Amer 49 (*)    GFR calc Af Amer 57 (*)    All other components within normal limits  CBC - Abnormal; Notable for the following:    WBC 17.5 (*)    All other components within normal limits  D-DIMER, QUANTITATIVE (NOT AT [facility]) - Abnormal; Notable for the following:    D-Dimer, Quant 8.83 (*)    All other  components within normal limits  I-STAT TROPOININ, ED    Imaging Review Dg Chest 2 View  03/16/2016  CLINICAL DATA:  Shortness of breath.  Chest pain. EXAM: CHEST  2 VIEW COMPARISON:  09/21/2015 FINDINGS: Heart size is normal. There is no pericardial effusion. New atelectasis is identified within the left base. No superimposed airspace consolidation. Mild spondylosis noted within the thoracic spine. IMPRESSION: 1. Left base atelectasis, new from previous exam. Electronically Signed   By: Kerby Moors M.D.   On: 03/16/2016 12:59   Ct Angio Chest Pe W/cm &/or Wo Cm  03/16/2016  CLINICAL DATA:  Acute onset of worsening shortness of breath. Left-sided rib pain. Initial encounter. EXAM: CT ANGIOGRAPHY CHEST WITH CONTRAST TECHNIQUE: Multidetector CT imaging of the chest was performed using the standard protocol during bolus administration  of intravenous contrast. Multiplanar CT image reconstructions and MIPs were obtained to evaluate the vascular anatomy. CONTRAST:  100 mL of Isovue 370 IV contrast COMPARISON:  Chest radiograph performed earlier today at 12:30 p.m. FINDINGS: There is no evidence of pulmonary embolus. Trace left pleural fluid is noted. Patchy bibasilar airspace opacities likely reflect atelectasis, though pneumonia could have a similar appearance. Mild bilateral emphysematous change is seen. No pneumothorax is identified. No masses are identified; no abnormal focal contrast enhancement is seen. Enlarged right hilar and subcarinal nodes are seen, measuring 1.5 cm and 2.1 cm in short axis. Additional aortopulmonary window nodes measure up to 1.8 cm. Mildly enlarged right paratracheal nodes measure up to 1.2 cm. Mild scattered calcification is noted along the aortic arch and proximal great vessels. Scattered coronary artery calcifications are seen. No axillary lymphadenopathy is seen. A 1.0 cm hypodensity within the left thyroid lobe is likely benign, given its size. A small hiatal hernia is noted.  The visualized portions of the liver and spleen are unremarkable. The visualized portions of the pancreas, stomach, adrenal glands and kidneys are within normal limits. No acute osseous abnormalities are seen. Review of the MIP images confirms the above findings. IMPRESSION: 1. No evidence of pulmonary embolus. 2. Trace left pleural fluid noted. Patchy bibasilar airspace opacities likely reflect atelectasis, though pneumonia could have a similar appearance. Mild bilateral emphysematous change seen. 3. Enlarged right hilar and mediastinal nodes, measuring up to 2.1 cm in short axis. Metastatic disease or lymphoma could have such an appearance, or a systemic inflammatory condition could have a similar appearance. Would correlate with the patient's history; biopsy could be considered as deemed clinically appropriate. 4. Scattered coronary artery calcifications seen. 5. Small hiatal hernia noted. Electronically Signed   By: Garald Balding M.D.   On: 03/16/2016 18:45   I have personally reviewed and evaluated these images and lab results as part of my medical decision-making.   EKG Interpretation   Date/Time:  Friday March 16 2016 12:01:26 EDT Ventricular Rate:  88 PR Interval:  132 QRS Duration: 118 QT Interval:  376 QTC Calculation: [id number] R Axis:   19 Text Interpretation:  Sinus rhythm with Premature atrial complexes Right  bundle branch block Abnormal ECG No significant change since last tracing  Confirmed by Karmina Zufall MD, Quillian Quince [id number]) on 03/16/2016 3:39:40 PM      MDM   Final diagnoses:  Atypical pneumonia    60 y.o. male presents with Left lower pleuritic chest pain increased with deep inspiration that started 10 days ago and has been progressive since onset. He has noted some shortness of breath. His chest x-ray shows some atelectasis in left base but is not convincing for community acquired pneumonia but he does have an elevated white blood cell count to 17 which is suggestive of dealt  developing infection or other insult. He has a negative troponin, no new findings on EKG, and pain is highly atypical for ACS with heart score of 2 so no further evaluation is warranted currently. He lacks cough, fever, or other typical symptoms. His exam is unremarkable, his wells score is 0 but given the description of pain and location consideration was given to PE so d-dimer analysis was performed for definitive rule out with plan to CT if elevated.  CT negative for PE, will treat for atypical pneumonia with plan to keep f/u appt with PCP early next week for recheck. Return precautions discussed for worsening or new concerning symptoms.     Leo Grosser,  MD 03/17/16 0222

## 2016-03-16 NOTE — ED Notes (Signed)
Pt transported to CT ?

## 2016-03-16 NOTE — ED Notes (Signed)
Pt here with worsening SOB since yesterday with pain to left rib cage/chest radiating throught o back. Pt speaking in compete sentences in triage. Resp e/u at this time.

## 2016-03-19 ENCOUNTER — Other Ambulatory Visit (INDEPENDENT_AMBULATORY_CARE_PROVIDER_SITE_OTHER): Payer: BLUE CROSS/BLUE SHIELD

## 2016-03-19 ENCOUNTER — Telehealth: Payer: Self-pay

## 2016-03-19 ENCOUNTER — Ambulatory Visit (INDEPENDENT_AMBULATORY_CARE_PROVIDER_SITE_OTHER)
Admission: RE | Admit: 2016-03-19 | Discharge: 2016-03-19 | Disposition: A | Discharge status: Home / Self Care | Payer: BLUE CROSS/BLUE SHIELD | Source: Ambulatory Visit | Attending: Internal Medicine | Admitting: Internal Medicine

## 2016-03-19 ENCOUNTER — Ambulatory Visit (INDEPENDENT_AMBULATORY_CARE_PROVIDER_SITE_OTHER): Payer: BLUE CROSS/BLUE SHIELD | Admitting: Internal Medicine

## 2016-03-19 VITALS — BP 144/90 | HR 97 | Temp 97.8°F | Resp 16 | Ht 67.0 in | Wt 191.0 lb

## 2016-03-19 DIAGNOSIS — D72829 Elevated white blood cell count, unspecified: Secondary | ICD-10-CM

## 2016-03-19 DIAGNOSIS — J189 Pneumonia, unspecified organism: Secondary | ICD-10-CM

## 2016-03-19 DIAGNOSIS — R590 Localized enlarged lymph nodes: Secondary | ICD-10-CM | POA: Insufficient documentation

## 2016-03-19 DIAGNOSIS — R599 Enlarged lymph nodes, unspecified: Secondary | ICD-10-CM

## 2016-03-19 DIAGNOSIS — R0789 Other chest pain: Secondary | ICD-10-CM | POA: Diagnosis not present

## 2016-03-19 LAB — CBC WITH DIFFERENTIAL/PLATELET
BASOS PCT: 0.6 % (ref 0.0–3.0)
Basophils Absolute: 0.1 10*3/uL (ref 0.0–0.1)
EOS PCT: 13.4 % — AB (ref 0.0–5.0)
Eosinophils Absolute: 3.1 10*3/uL — ABNORMAL HIGH (ref 0.0–0.7)
HCT: 41.7 % (ref 39.0–52.0)
Hemoglobin: 14 g/dL (ref 13.0–17.0)
LYMPHS ABS: 2.3 10*3/uL (ref 0.7–4.0)
Lymphocytes Relative: 9.9 % — ABNORMAL LOW (ref 12.0–46.0)
MCHC: 33.6 g/dL (ref 30.0–36.0)
MCV: 90.6 fl (ref 78.0–100.0)
MONO ABS: 2 10*3/uL — AB (ref 0.1–1.0)
MONOS PCT: 8.6 % (ref 3.0–12.0)
NEUTROS ABS: 15.6 10*3/uL — AB (ref 1.4–7.7)
NEUTROS PCT: 67.5 % (ref 43.0–77.0)
PLATELETS: 296 10*3/uL (ref 150.0–400.0)
RBC: 4.6 Mil/uL (ref 4.22–5.81)
RDW: 14.2 % (ref 11.5–15.5)
WBC: 23 10*3/uL (ref 4.0–10.5)

## 2016-03-19 LAB — COMPREHENSIVE METABOLIC PANEL
ALT: 11 U/L (ref 0–53)
AST: 16 U/L (ref 0–37)
Albumin: 3.8 g/dL (ref 3.5–5.2)
Alkaline Phosphatase: 131 U/L — ABNORMAL HIGH (ref 39–117)
BUN: 25 mg/dL — AB (ref 6–23)
CHLORIDE: 99 meq/L (ref 96–112)
CO2: 26 mEq/L (ref 19–32)
Calcium: 14.2 mg/dL (ref 8.4–10.5)
Creatinine, Ser: 1.67 mg/dL — ABNORMAL HIGH (ref 0.40–1.50)
GFR: 44.83 mL/min — AB (ref >60.00)
GLUCOSE: 120 mg/dL — AB (ref 70–99)
POTASSIUM: 4.2 meq/L (ref 3.5–5.1)
SODIUM: 136 meq/L (ref 135–145)
Total Bilirubin: 0.3 mg/dL (ref 0.2–1.2)
Total Protein: 7.9 g/dL (ref 6.0–8.3)

## 2016-03-19 LAB — SEDIMENTATION RATE: Sed Rate: 68 mm/hr — ABNORMAL HIGH (ref 0–20)

## 2016-03-19 LAB — HIV ANTIBODY (ROUTINE TESTING W REFLEX): HIV 1&2 Ab, 4th Generation: NONREACTIVE

## 2016-03-19 MED ORDER — HYDROCODONE-ACETAMINOPHEN 5-325 MG PO TABS
1.0000 | ORAL_TABLET | Freq: Four times a day (QID) | ORAL | Status: AC | PRN
Start: 2016-03-19 — End: ?

## 2016-03-19 NOTE — Patient Instructions (Signed)

## 2016-03-19 NOTE — Telephone Encounter (Signed)
CRITICAL LAB CALCIUM 14.2 VERBAL READBACK GILL FROM LAB EXT 806 VERBAL GIVEN TO PCP

## 2016-03-19 NOTE — Progress Notes (Signed)
Pre visit review using our clinic review tool, if applicable. No additional management support is needed unless otherwise documented below in the visit note. 

## 2016-03-19 NOTE — Telephone Encounter (Signed)
CRITICAL LAB WBC 23 VERBAL READBACK TONYA FROM LAB EXT 806 VRBAL GIVEN TO PCP

## 2016-03-19 NOTE — Progress Notes (Signed)
Subjective:  Patient ID: [name], male    DOB: 09-30-1956  Age: 60 y.o. MRN: [id number]  CC: Cough   HPI Opal Casebeer presents for follow-up after recent visit to the emergency room. He tells me he developed a nonproductive cough about 2 weeks ago. The cough eventually became productive and he presented to the emergency room where he underwent a CT-angio of his chest that revealed the possibility of pneumonia and also identified mediastinal lymphadenopathy. In addition he was found to have a white blood cell count that was elevated as well as hypercalcemia. He tells me he feels quite a bit better. The cough is diminishing and is now nonproductive. He has persistent discomfort in his chest wall that he localizes to the left posterior rib cage. The chest pain is exacerbated by coughing and movement. He has not taken anything for the pain but he is requesting something for the pain. He complains of chills and night sweats but no fever. He does not feel short of breath today. He has had some recent muscle aches.  Outpatient Prescriptions Prior to Visit  Medication Sig Dispense Refill  . ALPRAZolam (XANAX) 0.5 MG tablet TAKE ONE-HALF TO ONE TABLET BY MOUTH TWICE DAILY AS NEEDED FOR SLEEP 60 tablet 2  . amLODipine (NORVASC) 10 MG tablet TAKE ONE TABLET BY MOUTH ONCE DAILY 180 tablet 2  . aspirin EC 81 MG tablet Take 1 tablet (81 mg total) by mouth daily. 90 tablet 3  . Aspirin-Acetaminophen-Caffeine (EXCEDRIN PO) Take 2 tablets by mouth 4 (four) times daily.    Marland Kitchen atorvastatin (LIPITOR) 20 MG tablet Take 1 tablet (20 mg total) by mouth daily. 90 tablet 3  . levofloxacin (LEVAQUIN) 750 MG tablet Take 1 tablet (750 mg total) by mouth daily. 6 tablet 0  . mometasone (ELOCON) 0.1 % ointment Apply topically daily. 45 g 3  . naproxen sodium (ANAPROX) 220 MG tablet Take 220 mg by mouth as needed (for back pain).    Marland Kitchen omeprazole (PRILOSEC) 40 MG capsule Take 40 mg by mouth daily.     Marland Kitchen Umeclidinium  Bromide (INCRUSE ELLIPTA) 62.5 MCG/INH AEPB Inhale 1 puff into the lungs daily. 30 each 11   No facility-administered medications prior to visit.    ROS Review of Systems  Constitutional: Positive for chills, fatigue and unexpected weight change (some weight loss). Negative for fever, diaphoresis, activity change and appetite change.  HENT: Negative.  Negative for facial swelling, sinus pressure, sore throat and trouble swallowing.   Eyes: Negative.  Negative for visual disturbance.  Respiratory: Positive for cough. Negative for apnea, choking, chest tightness, shortness of breath, wheezing and stridor.   Cardiovascular: Negative.  Negative for chest pain, palpitations and leg swelling.  Gastrointestinal: Negative.  Negative for nausea, vomiting, abdominal pain, diarrhea and constipation.  Endocrine: Negative.   Genitourinary: Negative.  Negative for dysuria, urgency, frequency, hematuria, decreased urine volume and difficulty urinating.  Musculoskeletal: Negative.  Negative for myalgias, back pain, joint swelling and arthralgias.  Skin: Negative.  Negative for color change, pallor, rash and wound.  Allergic/Immunologic: Negative.   Neurological: Negative.  Negative for dizziness and headaches.  Hematological: Negative.  Negative for adenopathy. Does not bruise/bleed easily.  Psychiatric/Behavioral: Negative.     Objective:  BP 144/90 mmHg  Pulse 97  Temp(Src) 97.8 F (36.6 C) (Oral)  Resp 16  Ht 5\' 7"  (1.702 m)  Wt 191 lb (86.637 kg)  BMI 29.91 kg/m2  SpO2 93%  BP Readings from Last 3 Encounters:  03/19/16 144/90  03/16/16 163/84  02/27/16 135/73    Wt Readings from Last 3 Encounters:  03/19/16 191 lb (86.637 kg)  03/16/16 195 lb (88.451 kg)  12/14/15 200 lb (90.719 kg)    Physical Exam  Constitutional: He is oriented to person, place, and time.  Non-toxic appearance. He does not have a sickly appearance. He does not appear ill. No distress.  HENT:  Mouth/Throat:  Oropharynx is clear and moist. No oropharyngeal exudate.  Eyes: Conjunctivae are normal. Right eye exhibits no discharge. Left eye exhibits no discharge. No scleral icterus.  Neck: Normal range of motion. Neck supple. No JVD present. No tracheal deviation present. No thyromegaly present.  Cardiovascular: Normal rate, regular rhythm, normal heart sounds and intact distal pulses.  Exam reveals no gallop and no friction rub.   No murmur heard. Pulmonary/Chest: Effort normal and breath sounds normal. No accessory muscle usage or stridor. No respiratory distress. He has no decreased breath sounds. He has no wheezes. He has no rhonchi. He has no rales. He exhibits no mass, no tenderness, no bony tenderness, no crepitus, no edema, no deformity and no swelling.  Abdominal: Soft. Bowel sounds are normal. He exhibits no distension and no mass. There is no tenderness. There is no rebound and no guarding.  Musculoskeletal: Normal range of motion. He exhibits no edema or tenderness.  Lymphadenopathy:    He has no cervical adenopathy.  Neurological: He is oriented to person, place, and time.  Skin: Skin is warm and dry. No rash noted. He is not diaphoretic. No erythema. No pallor.  Vitals reviewed.   Lab Results  Component Value Date   WBC 23.0 Repeated and verified X2.* 03/19/2016   HGB 14.0 03/19/2016   HCT 41.7 03/19/2016   PLT 296.0 03/19/2016   GLUCOSE 120* 03/19/2016   CHOL 191 06/01/2015   TRIG 143.0 06/01/2015   HDL 40.00 06/01/2015   LDLCALC 122* 06/01/2015   ALT 11 03/19/2016   AST 16 03/19/2016   NA 136 03/19/2016   K 4.2 03/19/2016   CL 99 03/19/2016   CREATININE 1.67* 03/19/2016   BUN 25* 03/19/2016   CO2 26 03/19/2016   TSH 0.71 06/01/2015   PSA 0.46 03/17/2013    Dg Chest 2 View  03/16/2016  CLINICAL DATA:  Shortness of breath.  Chest pain. EXAM: CHEST  2 VIEW COMPARISON:  09/21/2015 FINDINGS: Heart size is normal. There is no pericardial effusion. New atelectasis is identified  within the left base. No superimposed airspace consolidation. Mild spondylosis noted within the thoracic spine. IMPRESSION: 1. Left base atelectasis, new from previous exam. Electronically Signed   By: Kerby Moors M.D.   On: 03/16/2016 12:59   Ct Angio Chest Pe W/cm &/or Wo Cm  03/16/2016  CLINICAL DATA:  Acute onset of worsening shortness of breath. Left-sided rib pain. Initial encounter. EXAM: CT ANGIOGRAPHY CHEST WITH CONTRAST TECHNIQUE: Multidetector CT imaging of the chest was performed using the standard protocol during bolus administration of intravenous contrast. Multiplanar CT image reconstructions and MIPs were obtained to evaluate the vascular anatomy. CONTRAST:  100 mL of Isovue 370 IV contrast COMPARISON:  Chest radiograph performed earlier today at 12:30 p.m. FINDINGS: There is no evidence of pulmonary embolus. Trace left pleural fluid is noted. Patchy bibasilar airspace opacities likely reflect atelectasis, though pneumonia could have a similar appearance. Mild bilateral emphysematous change is seen. No pneumothorax is identified. No masses are identified; no abnormal focal contrast enhancement is seen. Enlarged right hilar and subcarinal nodes  are seen, measuring 1.5 cm and 2.1 cm in short axis. Additional aortopulmonary window nodes measure up to 1.8 cm. Mildly enlarged right paratracheal nodes measure up to 1.2 cm. Mild scattered calcification is noted along the aortic arch and proximal great vessels. Scattered coronary artery calcifications are seen. No axillary lymphadenopathy is seen. A 1.0 cm hypodensity within the left thyroid lobe is likely benign, given its size. A small hiatal hernia is noted. The visualized portions of the liver and spleen are unremarkable. The visualized portions of the pancreas, stomach, adrenal glands and kidneys are within normal limits. No acute osseous abnormalities are seen. Review of the MIP images confirms the above findings. IMPRESSION: 1. No evidence of  pulmonary embolus. 2. Trace left pleural fluid noted. Patchy bibasilar airspace opacities likely reflect atelectasis, though pneumonia could have a similar appearance. Mild bilateral emphysematous change seen. 3. Enlarged right hilar and mediastinal nodes, measuring up to 2.1 cm in short axis. Metastatic disease or lymphoma could have such an appearance, or a systemic inflammatory condition could have a similar appearance. Would correlate with the patient's history; biopsy could be considered as deemed clinically appropriate. 4. Scattered coronary artery calcifications seen. 5. Small hiatal hernia noted. Electronically Signed   By: Garald Balding M.D.   On: 03/16/2016 18:45    Dg Chest 2 View  03/19/2016  CLINICAL DATA:  Shortness of breath, chest and back pain, duration of symptoms 2 weeks; recently diagnosed with pneumonia; former smoker, history of COPD and left ventricular hypertrophy EXAM: CHEST  2 VIEW COMPARISON:  Chest x-ray and chest CT scan of March 16, 2016 FINDINGS: The lungs are mildly hyperinflated with hemidiaphragm flattening. A small amount of pleural fluid blunts the costophrenic angles on the left. There is subtle increased density in the right infrahilar region likely reflecting atelectasis or early pneumonia. Known mediastinal and hilar lymphadenopathy is not clearly evident on this study. The heart and pulmonary vascularity are normal. There is mild multilevel degenerative disc disease of the thoracic spine. IMPRESSION: COPD. Trace of pleural fluid at the left lung base. Subsegmental atelectasis or early pneumonia in the right lower lobe. Electronically Signed   By: David  Martinique M.D.   On: 03/19/2016 11:12    Assessment & Plan:   Deavion was seen today for cough.  Diagnoses and all orders for this visit:  LAD (lymphadenopathy), mediastinal- His white cell count is up to 23, his sedimentation rate is elevated at 68, his calcium level is up to 14, I think he his having a flareup of  sarcoidosis and of asked him to take a course of steroids with Medrol Dosepak. His serum protein electrophoresis is pending and his flow cytometry is negative for lymphoproliferative disease. I also think he should consider getting one of these lymph node biopsied so I have sent a referral to pulmonary. -     Comprehensive metabolic panel; Future -     CBC with Differential/Platelet; Future -     Sedimentation rate; Future -     Protein electrophoresis, serum; Future -     Immunophenotyping by Flow Cytometry; Future -     HIV antibody; Future -     RPR; Future -     Ambulatory referral to Pulmonology -     methylPREDNISolone (MEDROL DOSEPAK) 4 MG TBPK tablet; TAKE AS DIRECTED  Pneumonia due to organism- improvement noted, he will complete the course of Levaquin -     DG Chest 2 View; Future -     methylPREDNISolone (  MEDROL DOSEPAK) 4 MG TBPK tablet; TAKE AS DIRECTED  Leukocytosis- his white cell count is up to 23,000 but his flow cytometry is negative for any abnormal B cell or T cell lines, serum protein electrophoresis is pending and screening for HIV and syphilis are negative. I'm concerned that this is a sarcoid flare and of asked him to take a course of steroids. -     Comprehensive metabolic panel; Future -     CBC with Differential/Platelet; Future -     Sedimentation rate; Future -     Protein electrophoresis, serum; Future -     Immunophenotyping by Flow Cytometry; Future -     HIV antibody; Future -     RPR; Future -     methylPREDNISolone (MEDROL DOSEPAK) 4 MG TBPK tablet; TAKE AS DIRECTED  Acute chest wall pain- will control the pain with Norco. -     HYDROcodone-acetaminophen (NORCO/VICODIN) 5-325 MG tablet; Take 1 tablet by mouth every 6 (six) hours as needed for moderate pain.   I am having Mr. Helmkamp start on HYDROcodone-acetaminophen and methylPREDNISolone. I am also having him maintain his omeprazole, mometasone, atorvastatin, aspirin EC, umeclidinium bromide,  amLODipine, ALPRAZolam, Aspirin-Acetaminophen-Caffeine (EXCEDRIN PO), naproxen sodium, and levofloxacin.  Meds ordered this encounter  Medications  . HYDROcodone-acetaminophen (NORCO/VICODIN) 5-325 MG tablet    Sig: Take 1 tablet by mouth every 6 (six) hours as needed for moderate pain.    Dispense:  65 tablet    Refill:  0  . methylPREDNISolone (MEDROL DOSEPAK) 4 MG TBPK tablet    Sig: TAKE AS DIRECTED    Dispense:  21 tablet    Refill:  0     Follow-up: Return in about 2 weeks (around 04/02/2016).  Scarlette Calico, MD

## 2016-03-20 ENCOUNTER — Telehealth: Payer: Self-pay | Admitting: Internal Medicine

## 2016-03-20 ENCOUNTER — Encounter: Payer: Self-pay | Admitting: Internal Medicine

## 2016-03-20 LAB — RPR

## 2016-03-20 LAB — IMMUNOPHENOTYPING BY FLOW CYTOMETRY: Viability: 94 %

## 2016-03-20 LAB — REFLEX BLAST SCREEN

## 2016-03-20 MED ORDER — METHYLPREDNISOLONE 4 MG PO TBPK: ORAL_TABLET | ORAL | Status: AC

## 2016-03-20 NOTE — Telephone Encounter (Signed)
[place] on additional labwork to result. Will inform pt

## 2016-03-20 NOTE — Telephone Encounter (Signed)
Pt informed by steff

## 2016-03-20 NOTE — Telephone Encounter (Signed)
Patient is requesting lab results.

## 2016-03-21 LAB — PROTEIN ELECTROPHORESIS, SERUM
ALBUMIN ELP: 3.2 g/dL — AB (ref 3.8–4.8)
Alpha-1-Globulin: 0.7 g/dL — ABNORMAL HIGH (ref 0.2–0.3)
Alpha-2-Globulin: 1.2 g/dL — ABNORMAL HIGH (ref 0.5–0.9)
BETA 2: 0.7 g/dL — AB (ref 0.2–0.5)
Beta Globulin: 0.6 g/dL (ref 0.4–0.6)
Gamma Globulin: 1.2 g/dL (ref 0.8–1.7)
TOTAL PROTEIN, SERUM ELECTROPHOR: 7.5 g/dL (ref 6.1–8.1)

## 2016-03-22 ENCOUNTER — Encounter (HOSPITAL_COMMUNITY): Payer: Self-pay | Admitting: Emergency Medicine

## 2016-03-22 ENCOUNTER — Emergency Department (HOSPITAL_COMMUNITY): Payer: BLUE CROSS/BLUE SHIELD

## 2016-03-22 ENCOUNTER — Inpatient Hospital Stay (HOSPITAL_COMMUNITY)
Admission: EM | Admit: 2016-03-22 | Discharge: 2016-04-14 | DRG: 853 | Disposition: E | Payer: BLUE CROSS/BLUE SHIELD | Attending: Pulmonary Disease | Admitting: Pulmonary Disease

## 2016-03-22 ENCOUNTER — Inpatient Hospital Stay (HOSPITAL_COMMUNITY): Payer: BLUE CROSS/BLUE SHIELD

## 2016-03-22 DIAGNOSIS — Z66 Do not resuscitate: Secondary | ICD-10-CM | POA: Diagnosis not present

## 2016-03-22 DIAGNOSIS — Z825 Family history of asthma and other chronic lower respiratory diseases: Secondary | ICD-10-CM | POA: Diagnosis not present

## 2016-03-22 DIAGNOSIS — Z87891 Personal history of nicotine dependence: Secondary | ICD-10-CM | POA: Diagnosis not present

## 2016-03-22 DIAGNOSIS — N179 Acute kidney failure, unspecified: Secondary | ICD-10-CM | POA: Insufficient documentation

## 2016-03-22 DIAGNOSIS — E870 Hyperosmolality and hypernatremia: Secondary | ICD-10-CM | POA: Diagnosis not present

## 2016-03-22 DIAGNOSIS — J80 Acute respiratory distress syndrome: Secondary | ICD-10-CM | POA: Diagnosis present

## 2016-03-22 DIAGNOSIS — J9819 Other pulmonary collapse: Secondary | ICD-10-CM | POA: Diagnosis not present

## 2016-03-22 DIAGNOSIS — I130 Hypertensive heart and chronic kidney disease with heart failure and stage 1 through stage 4 chronic kidney disease, or unspecified chronic kidney disease: Secondary | ICD-10-CM | POA: Diagnosis present

## 2016-03-22 DIAGNOSIS — D689 Coagulation defect, unspecified: Secondary | ICD-10-CM | POA: Diagnosis not present

## 2016-03-22 DIAGNOSIS — R34 Anuria and oliguria: Secondary | ICD-10-CM | POA: Diagnosis not present

## 2016-03-22 DIAGNOSIS — M1990 Unspecified osteoarthritis, unspecified site: Secondary | ICD-10-CM | POA: Diagnosis present

## 2016-03-22 DIAGNOSIS — Z515 Encounter for palliative care: Secondary | ICD-10-CM | POA: Diagnosis not present

## 2016-03-22 DIAGNOSIS — H8110 Benign paroxysmal vertigo, unspecified ear: Secondary | ICD-10-CM | POA: Diagnosis present

## 2016-03-22 DIAGNOSIS — K8020 Calculus of gallbladder without cholecystitis without obstruction: Secondary | ICD-10-CM

## 2016-03-22 DIAGNOSIS — Z7982 Long term (current) use of aspirin: Secondary | ICD-10-CM | POA: Diagnosis not present

## 2016-03-22 DIAGNOSIS — R911 Solitary pulmonary nodule: Secondary | ICD-10-CM | POA: Diagnosis not present

## 2016-03-22 DIAGNOSIS — E860 Dehydration: Secondary | ICD-10-CM | POA: Diagnosis present

## 2016-03-22 DIAGNOSIS — R739 Hyperglycemia, unspecified: Secondary | ICD-10-CM | POA: Diagnosis not present

## 2016-03-22 DIAGNOSIS — I998 Other disorder of circulatory system: Secondary | ICD-10-CM | POA: Diagnosis not present

## 2016-03-22 DIAGNOSIS — R509 Fever, unspecified: Secondary | ICD-10-CM

## 2016-03-22 DIAGNOSIS — G9340 Encephalopathy, unspecified: Secondary | ICD-10-CM | POA: Diagnosis not present

## 2016-03-22 DIAGNOSIS — E876 Hypokalemia: Secondary | ICD-10-CM | POA: Diagnosis not present

## 2016-03-22 DIAGNOSIS — R079 Chest pain, unspecified: Secondary | ICD-10-CM

## 2016-03-22 DIAGNOSIS — D696 Thrombocytopenia, unspecified: Secondary | ICD-10-CM | POA: Diagnosis not present

## 2016-03-22 DIAGNOSIS — R6510 Systemic inflammatory response syndrome (SIRS) of non-infectious origin without acute organ dysfunction: Secondary | ICD-10-CM

## 2016-03-22 DIAGNOSIS — Z888 Allergy status to other drugs, medicaments and biological substances status: Secondary | ICD-10-CM | POA: Diagnosis not present

## 2016-03-22 DIAGNOSIS — R590 Localized enlarged lymph nodes: Secondary | ICD-10-CM | POA: Diagnosis not present

## 2016-03-22 DIAGNOSIS — Z452 Encounter for adjustment and management of vascular access device: Secondary | ICD-10-CM

## 2016-03-22 DIAGNOSIS — E875 Hyperkalemia: Secondary | ICD-10-CM | POA: Diagnosis not present

## 2016-03-22 DIAGNOSIS — D72829 Elevated white blood cell count, unspecified: Secondary | ICD-10-CM

## 2016-03-22 DIAGNOSIS — Z7189 Other specified counseling: Secondary | ICD-10-CM | POA: Diagnosis not present

## 2016-03-22 DIAGNOSIS — J9859 Other diseases of mediastinum, not elsewhere classified: Secondary | ICD-10-CM | POA: Diagnosis present

## 2016-03-22 DIAGNOSIS — E785 Hyperlipidemia, unspecified: Secondary | ICD-10-CM | POA: Diagnosis present

## 2016-03-22 DIAGNOSIS — R4182 Altered mental status, unspecified: Secondary | ICD-10-CM | POA: Insufficient documentation

## 2016-03-22 DIAGNOSIS — I5032 Chronic diastolic (congestive) heart failure: Secondary | ICD-10-CM | POA: Diagnosis present

## 2016-03-22 DIAGNOSIS — J440 Chronic obstructive pulmonary disease with acute lower respiratory infection: Secondary | ICD-10-CM | POA: Diagnosis present

## 2016-03-22 DIAGNOSIS — D638 Anemia in other chronic diseases classified elsewhere: Secondary | ICD-10-CM | POA: Diagnosis not present

## 2016-03-22 DIAGNOSIS — I10 Essential (primary) hypertension: Secondary | ICD-10-CM | POA: Diagnosis not present

## 2016-03-22 DIAGNOSIS — C801 Malignant (primary) neoplasm, unspecified: Secondary | ICD-10-CM | POA: Diagnosis present

## 2016-03-22 DIAGNOSIS — E872 Acidosis: Secondary | ICD-10-CM | POA: Diagnosis present

## 2016-03-22 DIAGNOSIS — J449 Chronic obstructive pulmonary disease, unspecified: Secondary | ICD-10-CM

## 2016-03-22 DIAGNOSIS — K219 Gastro-esophageal reflux disease without esophagitis: Secondary | ICD-10-CM | POA: Diagnosis present

## 2016-03-22 DIAGNOSIS — G9341 Metabolic encephalopathy: Secondary | ICD-10-CM | POA: Diagnosis present

## 2016-03-22 DIAGNOSIS — N183 Chronic kidney disease, stage 3 unspecified: Secondary | ICD-10-CM | POA: Insufficient documentation

## 2016-03-22 DIAGNOSIS — C7800 Secondary malignant neoplasm of unspecified lung: Secondary | ICD-10-CM | POA: Diagnosis present

## 2016-03-22 DIAGNOSIS — J9601 Acute respiratory failure with hypoxia: Secondary | ICD-10-CM | POA: Diagnosis not present

## 2016-03-22 DIAGNOSIS — J9690 Respiratory failure, unspecified, unspecified whether with hypoxia or hypercapnia: Secondary | ICD-10-CM

## 2016-03-22 DIAGNOSIS — I739 Peripheral vascular disease, unspecified: Secondary | ICD-10-CM | POA: Diagnosis present

## 2016-03-22 DIAGNOSIS — N19 Unspecified kidney failure: Secondary | ICD-10-CM | POA: Diagnosis not present

## 2016-03-22 DIAGNOSIS — Z8249 Family history of ischemic heart disease and other diseases of the circulatory system: Secondary | ICD-10-CM | POA: Diagnosis not present

## 2016-03-22 DIAGNOSIS — Z4659 Encounter for fitting and adjustment of other gastrointestinal appliance and device: Secondary | ICD-10-CM

## 2016-03-22 DIAGNOSIS — A419 Sepsis, unspecified organism: Principal | ICD-10-CM | POA: Diagnosis present

## 2016-03-22 DIAGNOSIS — Z886 Allergy status to analgesic agent status: Secondary | ICD-10-CM | POA: Diagnosis not present

## 2016-03-22 DIAGNOSIS — Z978 Presence of other specified devices: Secondary | ICD-10-CM

## 2016-03-22 DIAGNOSIS — F411 Generalized anxiety disorder: Secondary | ICD-10-CM | POA: Diagnosis present

## 2016-03-22 DIAGNOSIS — J189 Pneumonia, unspecified organism: Secondary | ICD-10-CM | POA: Diagnosis present

## 2016-03-22 DIAGNOSIS — J9600 Acute respiratory failure, unspecified whether with hypoxia or hypercapnia: Secondary | ICD-10-CM

## 2016-03-22 DIAGNOSIS — E8770 Fluid overload, unspecified: Secondary | ICD-10-CM | POA: Diagnosis not present

## 2016-03-22 DIAGNOSIS — R599 Enlarged lymph nodes, unspecified: Secondary | ICD-10-CM | POA: Diagnosis not present

## 2016-03-22 LAB — COMPREHENSIVE METABOLIC PANEL
ALBUMIN: 3.2 g/dL — AB (ref 3.5–5.0)
ALT: 19 U/L (ref 17–63)
AST: 27 U/L (ref 15–41)
Alkaline Phosphatase: 113 U/L (ref 38–126)
Anion gap: 15 (ref 5–15)
BUN: 44 mg/dL — AB (ref 6–20)
CHLORIDE: 94 mmol/L — AB (ref 101–111)
CO2: 24 mmol/L (ref 22–32)
Calcium: >15 mg/dL (ref 8.9–10.3)
Creatinine, Ser: 1.97 mg/dL — ABNORMAL HIGH (ref 0.61–1.24)
GFR calc Af Amer: 41 mL/min — ABNORMAL LOW (ref >60)
GFR, EST NON AFRICAN AMERICAN: 35 mL/min — AB (ref >60)
GLUCOSE: 123 mg/dL — AB (ref 65–99)
POTASSIUM: 4.8 mmol/L (ref 3.5–5.1)
Sodium: 133 mmol/L — ABNORMAL LOW (ref 135–145)
Total Bilirubin: 0.8 mg/dL (ref 0.3–1.2)
Total Protein: 7.6 g/dL (ref 6.5–8.1)

## 2016-03-22 LAB — PROCALCITONIN: PROCALCITONIN: 0.37 ng/mL

## 2016-03-22 LAB — AMMONIA: AMMONIA: 26 umol/L (ref 9–35)

## 2016-03-22 LAB — URINE MICROSCOPIC-ADD ON: SQUAMOUS EPITHELIAL / LPF: NONE SEEN

## 2016-03-22 LAB — PROTIME-INR
INR: 1.17 (ref 0.00–1.49)
Prothrombin Time: 15 seconds (ref 11.6–15.2)

## 2016-03-22 LAB — I-STAT CG4 LACTIC ACID, ED
LACTIC ACID, VENOUS: 3.7 mmol/L — AB (ref 0.5–2.0)
Lactic Acid, Venous: 2.81 mmol/L (ref 0.5–2.0)

## 2016-03-22 LAB — URINALYSIS, ROUTINE W REFLEX MICROSCOPIC
Bilirubin Urine: NEGATIVE
Glucose, UA: NEGATIVE mg/dL
Ketones, ur: NEGATIVE mg/dL
LEUKOCYTES UA: NEGATIVE
NITRITE: NEGATIVE
PH: 5 (ref 5.0–8.0)
Protein, ur: NEGATIVE mg/dL
SPECIFIC GRAVITY, URINE: 1.013 (ref 1.005–1.030)

## 2016-03-22 LAB — CBC WITH DIFFERENTIAL/PLATELET
Basophils Absolute: 0.3 10*3/uL — ABNORMAL HIGH (ref 0.0–0.1)
Basophils Relative: 1 %
EOS PCT: 2 %
Eosinophils Absolute: 0.5 10*3/uL (ref 0.0–0.7)
HEMATOCRIT: 45.9 % (ref 39.0–52.0)
HEMOGLOBIN: 15.3 g/dL (ref 13.0–17.0)
LYMPHS ABS: 1.7 10*3/uL (ref 0.7–4.0)
LYMPHS PCT: 7 %
MCH: 30.5 pg (ref 26.0–34.0)
MCHC: 33.3 g/dL (ref 30.0–36.0)
MCV: 91.4 fL (ref 78.0–100.0)
MONOS PCT: 16 %
Monocytes Absolute: 4 10*3/uL — ABNORMAL HIGH (ref 0.1–1.0)
Neutro Abs: 18.4 10*3/uL — ABNORMAL HIGH (ref 1.7–7.7)
Neutrophils Relative %: 74 %
Platelets: 232 10*3/uL (ref 150–400)
RBC: 5.02 MIL/uL (ref 4.22–5.81)
RDW: 13.7 % (ref 11.5–15.5)
WBC: 24.9 10*3/uL — AB (ref 4.0–10.5)

## 2016-03-22 LAB — BASIC METABOLIC PANEL
ANION GAP: 10 (ref 5–15)
BUN: 37 mg/dL — ABNORMAL HIGH (ref 6–20)
CHLORIDE: 101 mmol/L (ref 101–111)
CO2: 25 mmol/L (ref 22–32)
Calcium: >15 mg/dL (ref 8.9–10.3)
Creatinine, Ser: 1.52 mg/dL — ABNORMAL HIGH (ref 0.61–1.24)
GFR calc non Af Amer: 48 mL/min — ABNORMAL LOW (ref >60)
GFR, EST AFRICAN AMERICAN: 56 mL/min — AB (ref >60)
Glucose, Bld: 131 mg/dL — ABNORMAL HIGH (ref 65–99)
POTASSIUM: 4.6 mmol/L (ref 3.5–5.1)
Sodium: 136 mmol/L (ref 135–145)

## 2016-03-22 LAB — RAPID URINE DRUG SCREEN, HOSP PERFORMED
AMPHETAMINES: NOT DETECTED
BARBITURATES: NOT DETECTED
BENZODIAZEPINES: NOT DETECTED
COCAINE: NOT DETECTED
Opiates: POSITIVE — AB
TETRAHYDROCANNABINOL: NOT DETECTED

## 2016-03-22 LAB — APTT: aPTT: 38 seconds — ABNORMAL HIGH (ref 24–37)

## 2016-03-22 LAB — I-STAT TROPONIN, ED: TROPONIN I, POC: 0.02 ng/mL (ref 0.00–0.08)

## 2016-03-22 LAB — LACTIC ACID, PLASMA
LACTIC ACID, VENOUS: 2.7 mmol/L — AB (ref 0.5–2.0)
LACTIC ACID, VENOUS: 2.9 mmol/L — AB (ref 0.5–2.0)
Lactic Acid, Venous: 2.6 mmol/L (ref 0.5–2.0)

## 2016-03-22 LAB — SAVE SMEAR

## 2016-03-22 LAB — MRSA PCR SCREENING: MRSA BY PCR: NEGATIVE

## 2016-03-22 MED ORDER — HYDRALAZINE HCL 20 MG/ML IJ SOLN
10.0000 mg | Freq: Three times a day (TID) | INTRAMUSCULAR | Status: DC | PRN
  Administered 2016-03-22 – 2016-03-26 (×4): 10 mg via INTRAVENOUS
  Filled 2016-03-22 (×4): qty 1

## 2016-03-22 MED ORDER — SODIUM CHLORIDE 0.9% FLUSH
3.0000 mL | Freq: Two times a day (BID) | INTRAVENOUS | Status: DC
  Administered 2016-03-22 – 2016-03-30 (×10): 3 mL via INTRAVENOUS

## 2016-03-22 MED ORDER — PIPERACILLIN-TAZOBACTAM 3.375 G IVPB
3.3750 g | Freq: Three times a day (TID) | INTRAVENOUS | Status: DC
  Administered 2016-03-22 – 2016-03-30 (×25): 3.375 g via INTRAVENOUS
  Filled 2016-03-22 (×27): qty 50

## 2016-03-22 MED ORDER — SODIUM CHLORIDE 0.9 % IV SOLN
INTRAVENOUS | Status: DC
  Administered 2016-03-22 – 2016-03-26 (×15): via INTRAVENOUS

## 2016-03-22 MED ORDER — SODIUM CHLORIDE 0.9 % IV SOLN: 90.0000 mg | Freq: Once | INTRAVENOUS | Status: DC

## 2016-03-22 MED ORDER — PIPERACILLIN-TAZOBACTAM 3.375 G IVPB 30 MIN
3.3750 g | Freq: Once | INTRAVENOUS | Status: AC
  Administered 2016-03-22: 3.375 g via INTRAVENOUS
  Filled 2016-03-22: qty 50

## 2016-03-22 MED ORDER — SODIUM CHLORIDE 0.9 % IV BOLUS (SEPSIS)
1000.0000 mL | Freq: Once | INTRAVENOUS | Status: AC
  Administered 2016-03-22: 1000 mL via INTRAVENOUS

## 2016-03-22 MED ORDER — ACETAMINOPHEN 325 MG PO TABS
650.0000 mg | ORAL_TABLET | Freq: Four times a day (QID) | ORAL | Status: DC | PRN
  Administered 2016-03-26: 650 mg via ORAL
  Filled 2016-03-22: qty 2

## 2016-03-22 MED ORDER — ONDANSETRON HCL 4 MG/2ML IJ SOLN: 4.0000 mg | Freq: Four times a day (QID) | INTRAMUSCULAR | Status: DC | PRN

## 2016-03-22 MED ORDER — VANCOMYCIN HCL 10 G IV SOLR
1250.0000 mg | INTRAVENOUS | Status: DC
  Filled 2016-03-22: qty 1250

## 2016-03-22 MED ORDER — CALCITONIN (SALMON) 200 UNIT/ML IJ SOLN
4.0000 [IU]/kg | Freq: Two times a day (BID) | INTRAMUSCULAR | Status: DC
  Filled 2016-03-22: qty 1.73

## 2016-03-22 MED ORDER — ACETAMINOPHEN 650 MG RE SUPP
650.0000 mg | Freq: Four times a day (QID) | RECTAL | Status: DC | PRN
  Administered 2016-03-23 – 2016-03-28 (×7): 650 mg via RECTAL
  Filled 2016-03-22 (×7): qty 1

## 2016-03-22 MED ORDER — POLYETHYLENE GLYCOL 3350 17 G PO PACK
17.0000 g | PACK | Freq: Every day | ORAL | Status: DC | PRN
  Filled 2016-03-22: qty 1

## 2016-03-22 MED ORDER — ONDANSETRON HCL 4 MG PO TABS: 4.0000 mg | ORAL_TABLET | Freq: Four times a day (QID) | ORAL | Status: DC | PRN

## 2016-03-22 MED ORDER — VANCOMYCIN HCL 500 MG IV SOLR
500.0000 mg | Freq: Once | INTRAVENOUS | Status: AC
  Administered 2016-03-22: 500 mg via INTRAVENOUS
  Filled 2016-03-22: qty 500

## 2016-03-22 MED ORDER — BISACODYL 5 MG PO TBEC: 5.0000 mg | DELAYED_RELEASE_TABLET | Freq: Every day | ORAL | Status: DC | PRN

## 2016-03-22 MED ORDER — CALCITONIN (SALMON) 200 UNIT/ML IJ SOLN
4.0000 [IU]/kg | INTRAMUSCULAR | Status: AC
  Administered 2016-03-22: 346 [IU] via INTRAMUSCULAR
  Filled 2016-03-22: qty 1.73

## 2016-03-22 MED ORDER — ZOLEDRONIC ACID 4 MG/5ML IV CONC
3.3000 mg | Freq: Once | INTRAVENOUS | Status: AC
  Administered 2016-03-22: 3.3 mg via INTRAVENOUS
  Filled 2016-03-22: qty 4.13

## 2016-03-22 MED ORDER — ENOXAPARIN SODIUM 30 MG/0.3ML ~~LOC~~ SOLN
30.0000 mg | SUBCUTANEOUS | Status: DC
  Administered 2016-03-22: 30 mg via SUBCUTANEOUS
  Filled 2016-03-22: qty 0.3

## 2016-03-22 MED ORDER — VANCOMYCIN HCL IN DEXTROSE 1-5 GM/200ML-% IV SOLN
1000.0000 mg | Freq: Once | INTRAVENOUS | Status: AC
  Administered 2016-03-22: 1000 mg via INTRAVENOUS
  Filled 2016-03-22: qty 200

## 2016-03-22 NOTE — ED Notes (Signed)
Placed condo cath on Pt per [facility] RN

## 2016-03-22 NOTE — Consult Note (Signed)
Pharmacy Antibiotic Note  Willa Stockstill is a 60 y.o. male who was seen in the ED last Friday 6/2 and diagnosed with possible pneumonia. He was sent home on levaquin. He returns to the ED today with AMS - code sepsis called.   Pharmacy has been consulted for vancomycin and zosyn dosing. He is in acute renal failure with sCr 1.97 (baseline 1.5-1.6) and CrCl 57ml/min.  Vancomycin 1g and Zosyn 3.375g given in the ED.  Goal vancomycin trough 15-20  Plan: 1) Vancomycin 500mg  x 1 to complete 1500mg  loading dose then 1250mg  IV q24 2) Zosyn 3.375g IV q8 (4 hour infusion) 3) Follow renal function, cultures, LOT, level as needed    No data recorded.   Recent Labs Lab 03/16/16 1208 03/19/16 0947  WBC 17.5* 23.0 Repeated and verified X2.*  CREATININE 1.51* 1.67*   Estimated creatinine clearance: 42 ml/min  Allergies  Allergen Reactions  . Lisinopril     cough  . Tramadol     insomnia    Antimicrobials this admission: 6/8 Vancomycin >> 6/8 Zosyn >>  Dose adjustments this admission: N/a  Microbiology results: 6/8 blood x2 >>  Thank you for allowing pharmacy to be a part of this patient's care.  Deboraha Sprang 03/25/2016 8:07 AM

## 2016-03-22 NOTE — ED Notes (Signed)
Placed pt in a brief

## 2016-03-22 NOTE — ED Notes (Signed)
Per [employer], patient was diagnosed with PNA last Friday.   Patient was picked up at home with GCS 13, patient non verbal, reactive to loud verbal commands.   Patient is not verbal at this time.   Patient breathing with mouth open only.   Patient bp with EMS 190/110.   Patient did have elevation in V3 and V4 leads on EKG done by EMS.   Patient has 18 LAC placed by EMS.

## 2016-03-22 NOTE — Consult Note (Signed)
New Hematology/Oncology Consult   Referral MD:Dr. Marily Memos        Reason for Referral: hypercalcemia   HPI: [name] was referred to the emergency room today secondary to altered mental status. His family reports he has become lethargic and confused over the past 2 days. He has a history of "weakness "and anorexia for several weeks. Family members report he complained of pain at the right chest wall and back. He was seen in the emergency room on 03/16/2016 with chest pain and dyspnea. He was treated for atypical pneumonia. A CT of the chest revealed no evidence of pulmonary embolism. Patchy bibasilar airspace opacities could represent pneumonia or atelectasis. Changes of emphysema were noted. No lung mass. Enlarged lymph nodes in the right hilar, AP window, and subcarinal regions. The liver and spleen appeared normal. No acute osseous findings.  He saw Dr. Ronnald Ramp 03/19/2016 and complained of a cough, left chest wall pain, chills, and night sweats. The calcium was noted to be elevated. He was treated with a steroid Dosepak with a concern for a flare of sarcoidosis. He was referred to pulmonary medicine to consider a lymph node biopsy.      Past Medical History  Diagnosis Date  . GERD (gastroesophageal reflux disease)   . Arthritis   . History of blood transfusion   . Allergy   . Essential hypertension, benign 03/17/2013  . LVH (left ventricular hypertrophy) due to hypertensive disease 07/07/2014  . Benign paroxysmal positional vertigo 10/06/2013    Jan 2015  MPRESSION: 1. No acute intracranial abnormality. 2. 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 3. Evidence of chronic right maxillary sinusitis. Possible superimposed mild acute sinusitis    . Diastolic dysfunction [id number]  . Dyspnea 09/15/2013  . Ejection fraction < 50% 10/14/2013    Study Conclusions  - Left ventricle: The cavity size was normal. Wall thickness was  increased in a pattern of moderate LVH. There was focal basal hypertrophy. Systolic function was normal. The estimated ejection fraction was in the range of 50% to 55%. Wall motion was normal; there were no regional wall motion abnormalities. - Left atrium: The atrium was mildly dilated. - Right atrium: The atrium was mildly dilated Overall Impression: Low risk stress nuclear study Thinning of the inferior wall not thought to be significant EF mildly depressed suggest MRI/Echo correlation .  LV Ejection Fraction: 49%. LV Wall Motion: Normal Wall Motion or Mildly decreased EF diffuse    . GAD (generalized anxiety disorder) 03/17/2013  . Snoring 03/17/2013  :  Past surgical history: None    Current facility-administered medications:  .  0.9 %  sodium chloride infusion, , Intravenous, Continuous, Willia Craze, NP, Last Rate: 100 mL/hr at 03/27/2016 1333 .  acetaminophen (TYLENOL) tablet 650 mg, 650 mg, Oral, Q6H PRN **OR** acetaminophen (TYLENOL) suppository 650 mg, 650 mg, Rectal, Q6H PRN, Willia Craze, NP .  bisacodyl (DULCOLAX) EC tablet 5 mg, 5 mg, Oral, Daily PRN, Willia Craze, NP .  enoxaparin (LOVENOX) injection 30 mg, 30 mg, Subcutaneous, Q24H, Willia Craze, NP .  hydrALAZINE (APRESOLINE) injection 10 mg, 10 mg, Intravenous, Q8H PRN, Willia Craze, NP .  ondansetron (ZOFRAN) tablet 4 mg, 4 mg, Oral, Q6H PRN **OR** ondansetron (ZOFRAN) injection 4 mg, 4 mg, Intravenous, Q6H PRN, Willia Craze, NP .  piperacillin-tazobactam (ZOSYN) IVPB 3.375 g, 3.375 g, Intravenous, Q8H, Otilio Miu, RPH .  polyethylene glycol (MIRALAX / GLYCOLAX) packet  17 g, 17 g, Oral, Daily PRN, Willia Craze, NP .  sodium chloride flush (NS) 0.9 % injection 3 mL, 3 mL, Intravenous, Q12H, Willia Craze, NP, 3 mL at 04/11/2016 1338 .  [START ON 03/23/2016] vancomycin (VANCOCIN) 1,250 mg in sodium chloride 0.9 % 250 mL IVPB, 1,250 mg, Intravenous, Q24H, Otilio Miu, [facility]  Current outpatient  prescriptions:  .  ALPRAZolam (XANAX) 0.5 MG tablet, TAKE ONE-HALF TO ONE TABLET BY MOUTH TWICE DAILY AS NEEDED FOR SLEEP, Disp: 60 tablet, Rfl: 2 .  amLODipine (NORVASC) 10 MG tablet, TAKE ONE TABLET BY MOUTH ONCE DAILY, Disp: 180 tablet, Rfl: 2 .  aspirin EC 81 MG tablet, Take 1 tablet (81 mg total) by mouth daily., Disp: 90 tablet, Rfl: 3 .  Aspirin-Acetaminophen-Caffeine (EXCEDRIN PO), Take 2 tablets by mouth 4 (four) times daily., Disp: , Rfl:  .  atorvastatin (LIPITOR) 20 MG tablet, Take 1 tablet (20 mg total) by mouth daily., Disp: 90 tablet, Rfl: 3 .  HYDROcodone-acetaminophen (NORCO/VICODIN) 5-325 MG tablet, Take 1 tablet by mouth every 6 (six) hours as needed for moderate pain., Disp: 65 tablet, Rfl: 0 .  levofloxacin (LEVAQUIN) 750 MG tablet, Take 1 tablet (750 mg total) by mouth daily., Disp: 6 tablet, Rfl: 0 .  methylPREDNISolone (MEDROL DOSEPAK) 4 MG TBPK tablet, TAKE AS DIRECTED, Disp: 21 tablet, Rfl: 0 .  mometasone (ELOCON) 0.1 % ointment, Apply topically daily., Disp: 45 g, Rfl: 3 .  naproxen sodium (ANAPROX) 220 MG tablet, Take 220 mg by mouth as needed (for back pain)., Disp: , Rfl:  .  omeprazole (PRILOSEC) 40 MG capsule, Take 40 mg by mouth daily. , Disp: , Rfl:  .  Umeclidinium Bromide (INCRUSE ELLIPTA) 62.5 MCG/INH AEPB, Inhale 1 puff into the lungs daily., Disp: 30 each, Rfl: 11:  . enoxaparin (LOVENOX) injection  30 mg Subcutaneous Q24H  . sodium chloride flush  3 mL Intravenous Q12H  :  Allergies  Allergen Reactions  . Lisinopril     cough  . Tramadol     insomnia  :  Family History  Problem Relation Age of Onset  . Arthritis Other   . Diabetes Other   . Stroke Mother   . Hypertension Other   . Asthma Father   . Stroke Mother   . Hypertension Mother   . Diabetes Father   . Lung cancer Brother     was a smoker  . Emphysema Father     was a smoker  :  Social History   Social History  . Marital Status: Single    Spouse Name: N/A  . Number of  Children: N/A  . Years of Education: N/A   Occupational History  . Heating and air conditioning    Social History Main Topics  . Smoking status: Former Smoker -- 1.00 packs/day for 40 years    Types: Cigarettes    Start date: 03/17/1972    Quit date: 06/17/2013  . Smokeless tobacco: Never Used  . Alcohol Use: No  . Drug Use: No  . Sexual Activity:    Partners: Female   Review of Systems: Unable to obtain secondary to altered mental status  Positives include:  A complete ROS was otherwise negative.   Physical Exam:  Blood pressure 172/100, pulse 110, temperature 99.5 F (37.5 C), temperature source Rectal, resp. rate 19, SpO2 96 %.  HEENT:  Mouth dry with white coat over tongue, neck without mass Lungs: loud upper airway sounds Cardiac: rrr Abdomen: no  hsm, no mass , nontender GU: testes without mass  Vascular:  No leg edema Lymph nodes: no cervical, supraclavicular, axillary, or inguinal nodes Neurologic: lethargic, arousable, follows simple commands, moves all extremities, pupils equal3 Skin: no rash   LABS:   Recent Labs  04/04/2016 0809  WBC 24.9*  HGB 15.3  HCT 45.9  PLT 232     Recent Labs  03/24/2016 0809  NA 133*  K 4.8  CL 94*  CO2 24  GLUCOSE 123*  BUN 44*  CREATININE 1.97*  CALCIUM >15.0*      RADIOLOGY:  Ct Abdomen Pelvis Wo Contrast  03/21/2016  CLINICAL DATA:  Altered mental status.  Possible sepsis. EXAM: CT ABDOMEN AND PELVIS WITHOUT CONTRAST TECHNIQUE: Multidetector CT imaging of the abdomen and pelvis was performed following the standard protocol without IV contrast. COMPARISON:  None. FINDINGS: Mild dependent atelectasis. Lung bases are otherwise within normal limits No free air or free fluid. No stones are seen in the right kidney. There is a 5 mm stone in the left kidney on coronal image 63. No other renal stones. No hydronephrosis. Mild bilateral perinephric stranding is identified. This may be chronic as no acute cause is seen. No  ureterectasis or ureteral stones. The liver is normal. The gallbladder is distended with high attenuation layering posteriorly. The gallbladder wall and pericholecystic region are not well assessed due to motion. The spleen, adrenal glands, and pancreas are normal. The abdominal aorta is atherosclerotic and tortuous but non aneurysmal. No adenopathy is identified. There is a small hiatal hernia. The stomach and small bowel are normal. The colon there is fecal loading in the proximal colon. The colon and appendix are otherwise normal. No adenopathy identified. The pelvis demonstrates no abnormalities in the prostate, seminal vesicles, or bladder. No suspicious masses or adenopathy. No acute bony abnormalities. Degenerative changes are seen in the spine. IMPRESSION: 1. Distended gallbladder with layering high attenuation dependently suggests a gallbladder with sludge or stones. An ultrasound could better evaluate. 2. 5 mm nonobstructive stone in the left kidney. Electronically Signed   By: Dorise Bullion III M.D   On: 03/31/2016 13:15   Dg Chest 2 View  03/19/2016  CLINICAL DATA:  Shortness of breath, chest and back pain, duration of symptoms 2 weeks; recently diagnosed with pneumonia; former smoker, history of COPD and left ventricular hypertrophy EXAM: CHEST  2 VIEW COMPARISON:  Chest x-ray and chest CT scan of March 16, 2016 FINDINGS: The lungs are mildly hyperinflated with hemidiaphragm flattening. A small amount of pleural fluid blunts the costophrenic angles on the left. There is subtle increased density in the right infrahilar region likely reflecting atelectasis or early pneumonia. Known mediastinal and hilar lymphadenopathy is not clearly evident on this study. The heart and pulmonary vascularity are normal. There is mild multilevel degenerative disc disease of the thoracic spine. IMPRESSION: COPD. Trace of pleural fluid at the left lung base. Subsegmental atelectasis or early pneumonia in the right lower  lobe. Electronically Signed   By: David  Martinique M.D.   On: 03/19/2016 11:12   Dg Chest 2 View  03/16/2016  CLINICAL DATA:  Shortness of breath.  Chest pain. EXAM: CHEST  2 VIEW COMPARISON:  09/21/2015 FINDINGS: Heart size is normal. There is no pericardial effusion. New atelectasis is identified within the left base. No superimposed airspace consolidation. Mild spondylosis noted within the thoracic spine. IMPRESSION: 1. Left base atelectasis, new from previous exam. Electronically Signed   By: Kerby Moors M.D.   On:  03/16/2016 12:59   Ct Head Wo Contrast  03/28/2016  CLINICAL DATA:  Altered mental status EXAM: CT HEAD WITHOUT CONTRAST TECHNIQUE: Contiguous axial images were obtained from the base of the skull through the vertex without intravenous contrast. COMPARISON:  Brain MRI October 19, 2013 FINDINGS: The ventricles are normal in size and configuration. There is no intracranial mass, hemorrhage, extra-axial fluid collection, or midline shift. There is mild small vessel disease in the centra semiovale bilaterally. There is evidence of a small age uncertain lacunar type infarct in the anterior left lentiform nucleus. Gray-white compartments elsewhere are normal. Bony calvarium appears intact. The mastoid air cells are clear. No intraorbital lesions are identified. There is opacification of multiple ethmoid air cells bilaterally. There is opacification in the visualized right maxillary antrum as well as mucosal thickening noted in the left maxillary antrum. IMPRESSION: Small age uncertain lacunar type infarct in the anterior left lentiform nucleus, best seen on axial slice 37 series 6. Elsewhere there is slight periventricular small vessel disease. No hemorrhage or mass effect. There is multifocal paranasal sinus disease. Electronically Signed   By: Lowella Grip III M.D.   On: 04/10/2016 11:54   Ct Angio Chest Pe W/cm &/or Wo Cm  03/16/2016  CLINICAL DATA:  Acute onset of worsening shortness of  breath. Left-sided rib pain. Initial encounter. EXAM: CT ANGIOGRAPHY CHEST WITH CONTRAST TECHNIQUE: Multidetector CT imaging of the chest was performed using the standard protocol during bolus administration of intravenous contrast. Multiplanar CT image reconstructions and MIPs were obtained to evaluate the vascular anatomy. CONTRAST:  100 mL of Isovue 370 IV contrast COMPARISON:  Chest radiograph performed earlier today at 12:30 p.m. FINDINGS: There is no evidence of pulmonary embolus. Trace left pleural fluid is noted. Patchy bibasilar airspace opacities likely reflect atelectasis, though pneumonia could have a similar appearance. Mild bilateral emphysematous change is seen. No pneumothorax is identified. No masses are identified; no abnormal focal contrast enhancement is seen. Enlarged right hilar and subcarinal nodes are seen, measuring 1.5 cm and 2.1 cm in short axis. Additional aortopulmonary window nodes measure up to 1.8 cm. Mildly enlarged right paratracheal nodes measure up to 1.2 cm. Mild scattered calcification is noted along the aortic arch and proximal great vessels. Scattered coronary artery calcifications are seen. No axillary lymphadenopathy is seen. A 1.0 cm hypodensity within the left thyroid lobe is likely benign, given its size. A small hiatal hernia is noted. The visualized portions of the liver and spleen are unremarkable. The visualized portions of the pancreas, stomach, adrenal glands and kidneys are within normal limits. No acute osseous abnormalities are seen. Review of the MIP images confirms the above findings. IMPRESSION: 1. No evidence of pulmonary embolus. 2. Trace left pleural fluid noted. Patchy bibasilar airspace opacities likely reflect atelectasis, though pneumonia could have a similar appearance. Mild bilateral emphysematous change seen. 3. Enlarged right hilar and mediastinal nodes, measuring up to 2.1 cm in short axis. Metastatic disease or lymphoma could have such an  appearance, or a systemic inflammatory condition could have a similar appearance. Would correlate with the patient's history; biopsy could be considered as deemed clinically appropriate. 4. Scattered coronary artery calcifications seen. 5. Small hiatal hernia noted. Electronically Signed   By: Garald Balding M.D.   On: 03/16/2016 18:45   Dg Chest Port 1 View  03/21/2016  CLINICAL DATA:  Altered mental status EXAM: PORTABLE CHEST 1 VIEW COMPARISON:  03/19/2016; 03/16/2016; chest CT - 03/16/2016 FINDINGS: Grossly unchanged borderline enlarged cardiac silhouette and mediastinal  contours with slight differences likely attributable to decreased lung volumes and AP projection. The pulmonary vasculature is indistinct with cephalization of flow. Minimal perihilar and medial basilar opacities favored to represent atelectasis. No pleural effusion or pneumothorax. No acute osseous abnormalities. IMPRESSION: Go suspected mild pulmonary edema on this hypoventilated AP portable examination. Electronically Signed   By: Sandi Mariscal M.D.   On: 04/12/2016 08:25   Dg Foot Complete Right  03/20/2016  Please see progress note for X-ray impression.  CT images reviewed with a radiologist-there are enlarged mediastinal/hilar lymph nodes, there is a left rib fracture, and suspicion for lytic bone lesions   Assessment and Plan:   1. Hypercalcemia 2. Mental status secondary to #1 3. Chest pain-likely secondary to a rib fracture and potentially bone metastases 4. COPD   Mr. Peng presents with symptomatic hypercalcemia. I am concerned he has an underlying malignancy. The differential diagnosis includes lung cancer, another metastatic carcinoma, lymphoma, and myeloma. The hypercalcemia could also be related to a benign condition such as sarcoidosis, but this is less likely.  I discussed the differential diagnosis and plans for diagnostic evaluation with his family.  Recommendations: 1. Intravenous hydration, calcitonin,  and biphosphonate therapy 2. Diagnostic bone scan 3. Consult pulmonary medicine to consider a diagnostic bronchoscopy/biopsy versus a consult to CVTS to consider a mediastinoscopy    Betsy Coder, MD 04/08/2016, 2:59 PM

## 2016-03-22 NOTE — H&P (Signed)
History and Physical    [name] [id number] DOB: [birth year] DOA: 04/09/2016  PCP: Scarlette Calico, MD  Patient coming from:   home    Chief Complaint:  weakness  HPI: [name] is a 60 y.o. male with medical history significant for but not necessarily limited to, hypertension, CKD, COPD, GERD, and diastolic dysfunction. Last week patient developed a cough and left sided rib cage pain. After feeling poorly for a few days patient came to the emergency department on 6/2. WBC elevated, CXR suggested PNA, given Levaquin and advised to f/u with PCP. Saw PCP on Monday, WBC still elevated. Ca+ noted to be rapidly rising. Told to complete Levaquin, course of steroids added. Labs for protein electrophoresis drawn.   History comes from chart and sister and Niece in room. Over last couple of days patient has become progressively weak and confused, no PO intake. No fevers per sister who has been staying with patient. He has been urinating per sister.    ED Course:  Temp 99.5, highly tachycardic up to 107, initial sat 89%, improved without oxygen to upper nineties 3 L fluid resuscitation Zosyn and vancomycin  Review of Systems: Unobtainable, patient obtunded  Past Medical History  Diagnosis Date  . GERD (gastroesophageal reflux disease)   . Arthritis   . History of blood transfusion   . Allergy   . Essential hypertension, benign 03/17/2013  . LVH (left ventricular hypertrophy) due to hypertensive disease 07/07/2014  . Benign paroxysmal positional vertigo 10/06/2013    Jan 2015  MPRESSION: 1. No acute intracranial abnormality. 2. 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 3. Evidence of chronic right maxillary sinusitis. Possible superimposed mild acute sinusitis    . Diastolic dysfunction [id number]  . Dyspnea 09/15/2013  . Ejection fraction < 50% 10/14/2013    Study Conclusions  - Left ventricle: The cavity size was normal. Wall  thickness was increased in a pattern of moderate LVH. There was focal basal hypertrophy. Systolic function was normal. The estimated ejection fraction was in the range of 50% to 55%. Wall motion was normal; there were no regional wall motion abnormalities. - Left atrium: The atrium was mildly dilated. - Right atrium: The atrium was mildly dilated Overall Impression: Low risk stress nuclear study Thinning of the inferior wall not thought to be significant EF mildly depressed suggest MRI/Echo correlation .  LV Ejection Fraction: 49%. LV Wall Motion: Normal Wall Motion or Mildly decreased EF diffuse    . GAD (generalized anxiety disorder) 03/17/2013  . Snoring 03/17/2013    PSH: unknown    Social History   Social History  . Marital Status: Single    Spouse Name: N/A  . Number of Children: N/A  . Years of Education: N/A   Occupational History  . Not on file.   Social History Main Topics  . Smoking status: Former Smoker -- 1.00 packs/day for 40 years    Types: Cigarettes    Start date: 03/17/1972    Quit date: 06/17/2013  . Smokeless tobacco: Never Used  . Alcohol Use: No  . Drug Use: No  . Sexual Activity:    Partners: Female   Other Topics Concern  . Not on file   Social History Narrative    Allergies  Allergen Reactions  . Lisinopril     cough  . Tramadol     insomnia    Family History  Problem Relation Age of Onset  . Arthritis Other   .  Diabetes Other   . Stroke Mother   . Hypertension Other   . Asthma Father   . Stroke Mother   . Hypertension Mother   . Diabetes Father   . Lung cancer Brother     was a smoker  . Emphysema Father     was a smoker    Prior to Admission medications   Medication Sig Start Date End Date Taking? Authorizing Provider  ALPRAZolam Duanne Moron) 0.5 MG tablet TAKE ONE-HALF TO ONE TABLET BY MOUTH TWICE DAILY AS NEEDED FOR SLEEP 10/27/15  Yes Janith Lima, MD  amLODipine (NORVASC) 10 MG tablet TAKE ONE TABLET BY MOUTH ONCE DAILY 09/22/15  Yes  Dorothy Spark, MD  aspirin EC 81 MG tablet Take 1 tablet (81 mg total) by mouth daily. 06/01/15  Yes Janith Lima, MD  Aspirin-Acetaminophen-Caffeine (EXCEDRIN PO) Take 2 tablets by mouth 4 (four) times daily.   Yes Historical Provider, MD  atorvastatin (LIPITOR) 20 MG tablet Take 1 tablet (20 mg total) by mouth daily. 06/01/15  Yes Janith Lima, MD  HYDROcodone-acetaminophen (NORCO/VICODIN) 5-325 MG tablet Take 1 tablet by mouth every 6 (six) hours as needed for moderate pain. 03/19/16  Yes Janith Lima, MD  levofloxacin (LEVAQUIN) 750 MG tablet Take 1 tablet (750 mg total) by mouth daily. 03/17/16  Yes Leo Grosser, MD  methylPREDNISolone (MEDROL DOSEPAK) 4 MG TBPK tablet TAKE AS DIRECTED 03/20/16  Yes Janith Lima, MD  mometasone (ELOCON) 0.1 % ointment Apply topically daily. 06/01/15  Yes Janith Lima, MD  naproxen sodium (ANAPROX) 220 MG tablet Take 220 mg by mouth as needed (for back pain).   Yes Historical Provider, MD  omeprazole (PRILOSEC) 40 MG capsule Take 40 mg by mouth daily.    Yes Historical Provider, MD  Umeclidinium Bromide (INCRUSE ELLIPTA) 62.5 MCG/INH AEPB Inhale 1 puff into the lungs daily. 09/21/15  Yes Janith Lima, MD    Physical Exam: Filed Vitals:   03/19/2016 1000 04/04/2016 1026 03/29/2016 1030 04/08/2016 1100  BP: 177/101 195/91 190/84 129/75  Pulse: 103 99 98 102  Temp:      TempSrc:      Resp: 20 23 17    SpO2: 96% 96% 98% 96%    Constitutional:  Nearly obtunded white, well developed male Filed Vitals:   03/23/2016 1000 03/21/2016 1026 03/15/2016 1030 04/11/2016 1100  BP: 177/101 195/91 190/84 129/75  Pulse: 103 99 98 102  Temp:      TempSrc:      Resp: 20 23 17    SpO2: 96% 96% 98% 96%   Eyes: Pupils equal, sluggish, lids and conjunctivae normal ENMT: Mucous membranes are moist. Posterior pharynx clear of any exudate or lesions.Normal dentition.  Neck: normal, supple, no masses Respiratory: poor participation in exam. No obvious wheezing  Cardiovascular:  Sinus tachycardia. No extremity edema. 2+ pedal pulses. No carotid bruits.  Abdomen: no tenderness, no masses palpated. No hepatomegaly. Bowel sounds positive.  Musculoskeletal: no clubbing / cyanosis. No joint deformity upper and lower extremities. Good ROM, no contractures. Normal muscle tone.  Skin: no rashes, lesions, ulcers.  Neurologic: Nearly obtunded. Made effort to follow some commands but unable to follow through Psychiatric: unable to evaluate  Labs on Admission: I have personally reviewed following labs and imaging studies   Urine analysis:    Component Value Date/Time   COLORURINE YELLOW 04/10/2016 [place] 04/11/2016 0922   LABSPEC 1.013 03/27/2016 0922   PHURINE 5.0 04/08/2016 [id number]  GLUCOSEU NEGATIVE 04/10/2016 0922   GLUCOSEU NEGATIVE 09/15/2013 0913   HGBUR SMALL* 04/12/2016 0922   BILIRUBINUR NEGATIVE 03/31/2016 0922   KETONESUR NEGATIVE 03/15/2016 0922   PROTEINUR NEGATIVE 04/04/2016 0922   UROBILINOGEN 0.2 09/15/2013 0913   NITRITE NEGATIVE 04/10/2016 0922   LEUKOCYTESUR NEGATIVE 03/17/2016 0922    Radiological Exams on Admission: Ct Head Wo Contrast  03/31/2016  CLINICAL DATA:  Altered mental status EXAM: CT HEAD WITHOUT CONTRAST TECHNIQUE: Contiguous axial images were obtained from the base of the skull through the vertex without intravenous contrast. COMPARISON:  Brain MRI October 19, 2013 FINDINGS: The ventricles are normal in size and configuration. There is no intracranial mass, hemorrhage, extra-axial fluid collection, or midline shift. There is mild small vessel disease in the centra semiovale bilaterally. There is evidence of a small age uncertain lacunar type infarct in the anterior left lentiform nucleus. Gray-white compartments elsewhere are normal. Bony calvarium appears intact. The mastoid air cells are clear. No intraorbital lesions are identified. There is opacification of multiple ethmoid air cells bilaterally. There is opacification  in the visualized right maxillary antrum as well as mucosal thickening noted in the left maxillary antrum. IMPRESSION: Small age uncertain lacunar type infarct in the anterior left lentiform nucleus, best seen on axial slice 37 series 6. Elsewhere there is slight periventricular small vessel disease. No hemorrhage or mass effect. There is multifocal paranasal sinus disease. Electronically Signed   By: Lowella Grip III M.D.   On: 03/28/2016 11:54   Dg Chest Port 1 View  04/03/2016  CLINICAL DATA:  Altered mental status EXAM: PORTABLE CHEST 1 VIEW COMPARISON:  03/19/2016; 03/16/2016; chest CT - 03/16/2016 FINDINGS: Grossly unchanged borderline enlarged cardiac silhouette and mediastinal contours with slight differences likely attributable to decreased lung volumes and AP projection. The pulmonary vasculature is indistinct with cephalization of flow. Minimal perihilar and medial basilar opacities favored to represent atelectasis. No pleural effusion or pneumothorax. No acute osseous abnormalities. IMPRESSION: Go suspected mild pulmonary edema on this hypoventilated AP portable examination. Electronically Signed   By: Sandi Mariscal M.D.   On: 03/21/2016 08:25    EKG: Independently reviewed.   EKG Interpretation  Date/Time:  Thursday March 22 2016 07:45:32 EDT Ventricular Rate:  106 PR Interval:  152 QRS Duration: 118 QT Interval:  323 QTC Calculation: 429 R Axis:   17 Text Interpretation:  Sinus tachycardia Incomplete right bundle branch block Confirmed by Johnney Killian, MD, Jeannie Done [phone]) on 03/19/2016 8:33:18 AM      Echo Oct 2016  Study Conclusions  - Left ventricle: The cavity size was normal. Wall thickness was  increased in a pattern of moderate LVH. Systolic function was  normal. The estimated ejection fraction was in the range of 55%  to 60%. Wall motion was normal; there were no regional wall  motion abnormalities. Doppler parameters are consistent with  abnormal left ventricular  relaxation (grade 1 diastolic  dysfunction). The E/e&' ratio is between 8-15, suggesting  indeterminate LV filling pressure. - Mitral valve: Mildly thickened leaflets . There was mild  regurgitation. - Left atrium: The atrium was mildly dilated at 37 ml/m2. - Tricuspid valve: There was mild regurgitation. - Pulmonary arteries: PA peak pressure: 31 mm Hg (S). - Inferior vena cava: The vessel was normal in size. The  respirophasic diameter changes were in the normal range (>= 50%),  consistent with normal central venous pressure.  Impressions:  - Compared to a prior echo in 2015, the EF has improved to 55-60%.  Assessment/Plan  Active Problems:   Essential hypertension, benign   Hyperlipidemia with target LDL less than 100   Leukocytosis   Sepsis (HCC)   Hypercalcemia   Encephalopathy   AKI (acute kidney injury) ([place])      SIRS with encephalopathy. AKI superimposed on CKD 3, leukocytosis, .tachycardia, lactic acid of 3.7.   U/A and CXR unrevealing,          -admit to Stepdown -Continue Vancomycin / Rocephin started in ED -fluid resus completed in ED - 3 liters -continue maintenance IVF -follow up on blood and urine cx -trend lactic acid levels -UDS -Head CT given altered mental status -CT scan of the abdomen and pelvis   AKI superimposed on CKD 3.  -IV fluid resus -Avoid / dose reduce Nephrotoxic medications -f/u labs  Hypercalemia. Calcium steadily rising since 03/19/16   11 >>>14.2 >>>>15. Albumin 3.2 -obtain ionized Ca+ -PTH -fluid resus in progress -Recheck serum Ca+ at 2pm -calcitonin and Bisphosphonate ordered  COPD.  Stable on home Incruse-Ellipta. -No respiratory distress.  -O2 when necessary  Hyperlipidemia -hold Zocor until taking by mouth  HTN. Controlled in ED -Hold home amlodipine until taking by mouth -Apresoline as needed  GERD.  . -On PPI at home. Will resume when patient able to tolerate by mouth  History of diastolic heart  failure, grade 1 diastolic dysfunction on echocardiogram October 2016 . -Monitor intake and output -Monitor for signs of volume overload, there was question of mild pulmonary edema on today's chest x-ray  DVT prophylaxis:   Lovenox, reduced dose Code Status:   Full code  Family Communication:    Sister Vaughan Basta and Niece in room. They both understand and agree with treatment plan  Disposition Plan: Discharge home  in 3-4 days               Consults called:    Hematology - Dr. Learta Codding to see.  Admission status:    Admission -  Stepdown  Tye Savoy NP [facility] Pager [phone]  If 7PM-7AM, please contact night-coverage [email or web address] Password [facility]  04/04/2016, 12:03 PM

## 2016-03-22 NOTE — ED Provider Notes (Signed)
CSN: [id number]     Arrival date & time 04/11/2016  0736 History   First MD Initiated Contact with Patient 03/31/2016 [phone]     Chief Complaint  Patient presents with  . Altered Mental Status     (Consider location/radiation/quality/duration/timing/severity/associated sxs/prior Treatment) HPI Comments: Patient presents to the emergency department with chief complaint of altered mental status. He was diagnosed with pneumonia 5 days ago. Per EMS and family member, he has been steadily declining for the past 3 days. Patient has been altered since last night.  He is accompanied by his sister, who stayed with him last night.  She reports that he was hallucinating last night, but this morning was too weak to even speak. He is unable to contribute to his history.  Per chart review, patient found to have a developing right-sided pneumonia and is currently taking Levaquin.  Level V caveat applies secondary to AMS.  The history is provided by the EMS personnel. The history is limited by the condition of the patient. A language interpreter was used.    Past Medical History  Diagnosis Date  . GERD (gastroesophageal reflux disease)   . Arthritis   . History of blood transfusion   . Allergy   . Essential hypertension, benign 03/17/2013  . LVH (left ventricular hypertrophy) due to hypertensive disease 07/07/2014  . Benign paroxysmal positional vertigo 10/06/2013    Jan 2015  MPRESSION: 1. No acute intracranial abnormality. 2. 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 3. Evidence of chronic right maxillary sinusitis. Possible superimposed mild acute sinusitis    . Diastolic dysfunction [id number]  . Dyspnea 09/15/2013  . Ejection fraction < 50% 10/14/2013    Study Conclusions  - Left ventricle: The cavity size was normal. Wall thickness was increased in a pattern of moderate LVH. There was focal basal hypertrophy. Systolic function was normal. The  estimated ejection fraction was in the range of 50% to 55%. Wall motion was normal; there were no regional wall motion abnormalities. - Left atrium: The atrium was mildly dilated. - Right atrium: The atrium was mildly dilated Overall Impression: Low risk stress nuclear study Thinning of the inferior wall not thought to be significant EF mildly depressed suggest MRI/Echo correlation .  LV Ejection Fraction: 49%. LV Wall Motion: Normal Wall Motion or Mildly decreased EF diffuse    . GAD (generalized anxiety disorder) 03/17/2013  . Snoring 03/17/2013   No past surgical history on file. Family History  Problem Relation Age of Onset  . Arthritis Other   . Diabetes Other   . Stroke Mother   . Hypertension Other   . Asthma Father   . Stroke Mother   . Hypertension Mother   . Diabetes Father   . Lung cancer Brother     was a smoker  . Emphysema Father     was a smoker   Social History  Substance Use Topics  . Smoking status: Former Smoker -- 1.00 packs/day for 40 years    Types: Cigarettes    Start date: 03/17/1972    Quit date: 06/17/2013  . Smokeless tobacco: Never Used  . Alcohol Use: No    Review of Systems  Unable to perform ROS: Mental status change      Allergies  Lisinopril and Tramadol  Home Medications   Prior to Admission medications   Medication Sig Start Date End Date Taking? Authorizing Provider  ALPRAZolam (XANAX) 0.5 MG tablet TAKE ONE-HALF TO ONE  TABLET BY MOUTH TWICE DAILY AS NEEDED FOR SLEEP 10/27/15   Janith Lima, MD  amLODipine (NORVASC) 10 MG tablet TAKE ONE TABLET BY MOUTH ONCE DAILY 09/22/15   Dorothy Spark, MD  aspirin EC 81 MG tablet Take 1 tablet (81 mg total) by mouth daily. 06/01/15   Janith Lima, MD  Aspirin-Acetaminophen-Caffeine (EXCEDRIN PO) Take 2 tablets by mouth 4 (four) times daily.    Historical Provider, MD  atorvastatin (LIPITOR) 20 MG tablet Take 1 tablet (20 mg total) by mouth daily. 06/01/15   Janith Lima, MD   HYDROcodone-acetaminophen (NORCO/VICODIN) 5-325 MG tablet Take 1 tablet by mouth every 6 (six) hours as needed for moderate pain. 03/19/16   Janith Lima, MD  levofloxacin (LEVAQUIN) 750 MG tablet Take 1 tablet (750 mg total) by mouth daily. 03/17/16   Leo Grosser, MD  methylPREDNISolone (MEDROL DOSEPAK) 4 MG TBPK tablet TAKE AS DIRECTED 03/20/16   Janith Lima, MD  mometasone (ELOCON) 0.1 % ointment Apply topically daily. 06/01/15   Janith Lima, MD  naproxen sodium (ANAPROX) 220 MG tablet Take 220 mg by mouth as needed (for back pain).    Historical Provider, MD  omeprazole (PRILOSEC) 40 MG capsule Take 40 mg by mouth daily.     Historical Provider, MD  Umeclidinium Bromide (INCRUSE ELLIPTA) 62.5 MCG/INH AEPB Inhale 1 puff into the lungs daily. 09/21/15   Janith Lima, MD   There were no vitals taken for this visit. Physical Exam  Constitutional: He appears well-developed and well-nourished.  HENT:  Head: Normocephalic and atraumatic.  Eyes: Conjunctivae and EOM are normal. Pupils are equal, round, and reactive to light. Right eye exhibits no discharge. Left eye exhibits no discharge. No scleral icterus.  Neck: Normal range of motion. Neck supple. No JVD present.  Cardiovascular: Regular rhythm and normal heart sounds.  Exam reveals no gallop and no friction rub.   No murmur heard. tachycardic  Pulmonary/Chest: Effort normal. No respiratory distress. He has no wheezes. He has rales. He exhibits no tenderness.  Abdominal: Soft. He exhibits no distension and no mass. There is no tenderness. There is no rebound and no guarding.  Musculoskeletal: Normal range of motion. He exhibits no edema or tenderness.  Neurological:  GCS 13 Responds to physical stimulus  Skin: Skin is warm and dry.  No rashes  Psychiatric: He has a normal mood and affect. His behavior is normal. Judgment and thought content normal.  Nursing note and vitals reviewed.   ED Course  Procedures (including critical care  time) Results for orders placed or performed during the hospital encounter of 03/18/2016  Comprehensive metabolic panel  Result Value Ref Range   Sodium 133 (L) 135 - 145 mmol/L   Potassium 4.8 3.5 - 5.1 mmol/L   Chloride 94 (L) 101 - 111 mmol/L   CO2 24 22 - 32 mmol/L   Glucose, Bld 123 (H) 65 - 99 mg/dL   BUN 44 (H) 6 - 20 mg/dL   Creatinine, Ser 1.97 (H) 0.61 - 1.24 mg/dL   Calcium >15.0 (HH) 8.9 - 10.3 mg/dL   Total Protein 7.6 6.5 - 8.1 g/dL   Albumin 3.2 (L) 3.5 - 5.0 g/dL   AST 27 15 - 41 U/L   ALT 19 17 - 63 U/L   Alkaline Phosphatase 113 38 - 126 U/L   Total Bilirubin 0.8 0.3 - 1.2 mg/dL   GFR calc non Af Amer 35 (L) >60 mL/min   GFR calc Af  Amer 41 (L) >60 mL/min   Anion gap 15 5 - 15  CBC WITH DIFFERENTIAL  Result Value Ref Range   WBC 24.9 (H) 4.0 - 10.5 K/uL   RBC 5.02 4.22 - 5.81 MIL/uL   Hemoglobin 15.3 13.0 - 17.0 g/dL   HCT 45.9 39.0 - 52.0 %   MCV 91.4 78.0 - 100.0 fL   MCH 30.5 26.0 - 34.0 pg   MCHC 33.3 30.0 - 36.0 g/dL   RDW 13.7 11.5 - 15.5 %   Platelets 232 150 - 400 K/uL   Neutrophils Relative % 74 %   Lymphocytes Relative 7 %   Monocytes Relative 16 %   Eosinophils Relative 2 %   Basophils Relative 1 %   Neutro Abs 18.4 (H) 1.7 - 7.7 K/uL   Lymphs Abs 1.7 0.7 - 4.0 K/uL   Monocytes Absolute 4.0 (H) 0.1 - 1.0 K/uL   Eosinophils Absolute 0.5 0.0 - 0.7 K/uL   Basophils Absolute 0.3 (H) 0.0 - 0.1 K/uL   RBC Morphology TEARDROP CELLS    WBC Morphology MILD LEFT SHIFT (1-5% METAS, OCC MYELO, OCC BANDS)   Urinalysis, Routine w reflex microscopic (not at [facility])  Result Value Ref Range   Color, Urine YELLOW YELLOW   APPearance CLEAR CLEAR   Specific Gravity, Urine 1.013 1.005 - 1.030   pH 5.0 5.0 - 8.0   Glucose, UA NEGATIVE NEGATIVE mg/dL   Hgb urine dipstick SMALL (A) NEGATIVE   Bilirubin Urine NEGATIVE NEGATIVE   Ketones, ur NEGATIVE NEGATIVE mg/dL   Protein, ur NEGATIVE NEGATIVE mg/dL   Nitrite NEGATIVE NEGATIVE   Leukocytes, UA NEGATIVE  NEGATIVE  Ammonia  Result Value Ref Range   Ammonia 26 9 - 35 umol/L  Urine microscopic-add on  Result Value Ref Range   Squamous Epithelial / LPF NONE SEEN NONE SEEN   WBC, UA 0-5 0 - 5 WBC/hpf   RBC / HPF 0-5 0 - 5 RBC/hpf   Bacteria, UA RARE (A) NONE SEEN   Casts HYALINE CASTS (A) NEGATIVE  I-Stat CG4 Lactic Acid, ED  (not at  [facility])  Result Value Ref Range   Lactic Acid, Venous 3.70 (HH) 0.5 - 2.0 mmol/L   Comment NOTIFIED PHYSICIAN   I-stat troponin, ED (not at [facility])  Result Value Ref Range   Troponin i, poc 0.02 0.00 - 0.08 ng/mL   Comment 3           Dg Chest 2 View  03/19/2016  CLINICAL DATA:  Shortness of breath, chest and back pain, duration of symptoms 2 weeks; recently diagnosed with pneumonia; former smoker, history of COPD and left ventricular hypertrophy EXAM: CHEST  2 VIEW COMPARISON:  Chest x-ray and chest CT scan of March 16, 2016 FINDINGS: The lungs are mildly hyperinflated with hemidiaphragm flattening. A small amount of pleural fluid blunts the costophrenic angles on the left. There is subtle increased density in the right infrahilar region likely reflecting atelectasis or early pneumonia. Known mediastinal and hilar lymphadenopathy is not clearly evident on this study. The heart and pulmonary vascularity are normal. There is mild multilevel degenerative disc disease of the thoracic spine. IMPRESSION: COPD. Trace of pleural fluid at the left lung base. Subsegmental atelectasis or early pneumonia in the right lower lobe. Electronically Signed   By: David  Martinique M.D.   On: 03/19/2016 11:12   Dg Chest 2 View  03/16/2016  CLINICAL DATA:  Shortness of breath.  Chest pain. EXAM: CHEST  2 VIEW COMPARISON:  09/21/2015 FINDINGS: Heart size is normal. There is no pericardial effusion. New atelectasis is identified within the left base. No superimposed airspace consolidation. Mild spondylosis noted within the thoracic spine. IMPRESSION: 1. Left base atelectasis, new from previous  exam. Electronically Signed   By: Kerby Moors M.D.   On: 03/16/2016 12:59   Ct Angio Chest Pe W/cm &/or Wo Cm  03/16/2016  CLINICAL DATA:  Acute onset of worsening shortness of breath. Left-sided rib pain. Initial encounter. EXAM: CT ANGIOGRAPHY CHEST WITH CONTRAST TECHNIQUE: Multidetector CT imaging of the chest was performed using the standard protocol during bolus administration of intravenous contrast. Multiplanar CT image reconstructions and MIPs were obtained to evaluate the vascular anatomy. CONTRAST:  100 mL of Isovue 370 IV contrast COMPARISON:  Chest radiograph performed earlier today at 12:30 p.m. FINDINGS: There is no evidence of pulmonary embolus. Trace left pleural fluid is noted. Patchy bibasilar airspace opacities likely reflect atelectasis, though pneumonia could have a similar appearance. Mild bilateral emphysematous change is seen. No pneumothorax is identified. No masses are identified; no abnormal focal contrast enhancement is seen. Enlarged right hilar and subcarinal nodes are seen, measuring 1.5 cm and 2.1 cm in short axis. Additional aortopulmonary window nodes measure up to 1.8 cm. Mildly enlarged right paratracheal nodes measure up to 1.2 cm. Mild scattered calcification is noted along the aortic arch and proximal great vessels. Scattered coronary artery calcifications are seen. No axillary lymphadenopathy is seen. A 1.0 cm hypodensity within the left thyroid lobe is likely benign, given its size. A small hiatal hernia is noted. The visualized portions of the liver and spleen are unremarkable. The visualized portions of the pancreas, stomach, adrenal glands and kidneys are within normal limits. No acute osseous abnormalities are seen. Review of the MIP images confirms the above findings. IMPRESSION: 1. No evidence of pulmonary embolus. 2. Trace left pleural fluid noted. Patchy bibasilar airspace opacities likely reflect atelectasis, though pneumonia could have a similar appearance.  Mild bilateral emphysematous change seen. 3. Enlarged right hilar and mediastinal nodes, measuring up to 2.1 cm in short axis. Metastatic disease or lymphoma could have such an appearance, or a systemic inflammatory condition could have a similar appearance. Would correlate with the patient's history; biopsy could be considered as deemed clinically appropriate. 4. Scattered coronary artery calcifications seen. 5. Small hiatal hernia noted. Electronically Signed   By: Garald Balding M.D.   On: 03/16/2016 18:45   Dg Chest Port 1 View  04/09/2016  CLINICAL DATA:  Altered mental status EXAM: PORTABLE CHEST 1 VIEW COMPARISON:  03/19/2016; 03/16/2016; chest CT - 03/16/2016 FINDINGS: Grossly unchanged borderline enlarged cardiac silhouette and mediastinal contours with slight differences likely attributable to decreased lung volumes and AP projection. The pulmonary vasculature is indistinct with cephalization of flow. Minimal perihilar and medial basilar opacities favored to represent atelectasis. No pleural effusion or pneumothorax. No acute osseous abnormalities. IMPRESSION: Go suspected mild pulmonary edema on this hypoventilated AP portable examination. Electronically Signed   By: Sandi Mariscal M.D.   On: 03/20/2016 08:25   Dg Foot Complete Right  03/20/2016  Please see progress note for X-ray impression.   I have personally reviewed and evaluated these images and lab results as part of my medical decision-making.   EKG Interpretation   Date/Time:  Thursday March 22 2016 07:45:32 EDT Ventricular Rate:  106 PR Interval:  152 QRS Duration: 118 QT Interval:  323 QTC Calculation: 429 R Axis:   17 Text Interpretation:  Sinus tachycardia Incomplete right bundle  branch  block Confirmed by Johnney Killian, MD, Jeannie Done [phone]) on 03/27/2016 8:33:18 AM      MDM   Final diagnoses:  Altered mental status, unspecified altered mental status type  Sepsis, due to unspecified organism [facility])    Patient with altered  mental status. Presumably from infectious source and likely pneumonia. Patient has rales on exam, recent chest x-ray from 3 days ago shows developing pneumonia. Will begin sepsis protocol because of altered mental status and recent pneumonia.  8:12 AM Rectal temp 99.5.  8:23 AM Lactic acid is 3.7. WBC is 24.9.  8:36 AM Patient discussed with Dr. Johnney Killian reviewed CXR and agrees with radiology report.  Recommends continuing sepsis protocol and adding an ammonia level.  Also consider steroid induced psychosis.  Dr. Johnney Killian to see patient.  9:15 AM Notified by RN that calcium is >15.  Albumin 3.2. Continue fluids.  Discussed with Dr. Johnney Killian.  Will add PTH.  10:41 AM Urinalysis unremarkable.  Appreciate hospitalist team for the patient.  CRITICAL CARE Performed by: Montine Circle   Total critical care time: 45 minutes  Critical care time was exclusive of separately billable procedures and treating other patients.  Critical care was necessary to treat or prevent imminent or life-threatening deterioration.  Critical care was time spent personally by me on the following activities: development of treatment plan with patient and/or surrogate as well as nursing, discussions with consultants, evaluation of patient's response to treatment, examination of patient, obtaining history from patient or surrogate, ordering and performing treatments and interventions, ordering and review of laboratory studies, ordering and review of radiographic studies, pulse oximetry and re-evaluation of patient's condition.   Montine Circle, PA-C 04/13/2016 1041  Charlesetta Shanks, MD 2016-04-28 [phone]

## 2016-03-23 DIAGNOSIS — G9340 Encephalopathy, unspecified: Secondary | ICD-10-CM | POA: Insufficient documentation

## 2016-03-23 DIAGNOSIS — I10 Essential (primary) hypertension: Secondary | ICD-10-CM

## 2016-03-23 DIAGNOSIS — R911 Solitary pulmonary nodule: Secondary | ICD-10-CM

## 2016-03-23 DIAGNOSIS — N179 Acute kidney failure, unspecified: Secondary | ICD-10-CM | POA: Insufficient documentation

## 2016-03-23 DIAGNOSIS — R4182 Altered mental status, unspecified: Secondary | ICD-10-CM

## 2016-03-23 DIAGNOSIS — N183 Chronic kidney disease, stage 3 unspecified: Secondary | ICD-10-CM | POA: Insufficient documentation

## 2016-03-23 LAB — COMPREHENSIVE METABOLIC PANEL
ALBUMIN: 2.7 g/dL — AB (ref 3.5–5.0)
ALT: 20 U/L (ref 17–63)
ANION GAP: 11 (ref 5–15)
AST: 23 U/L (ref 15–41)
Alkaline Phosphatase: 89 U/L (ref 38–126)
BUN: 33 mg/dL — AB (ref 6–20)
CO2: 27 mmol/L (ref 22–32)
Calcium: >15 mg/dL (ref 8.9–10.3)
Chloride: 103 mmol/L (ref 101–111)
Creatinine, Ser: 1.41 mg/dL — ABNORMAL HIGH (ref 0.61–1.24)
GFR calc Af Amer: >60 mL/min (ref >60)
GFR calc non Af Amer: 53 mL/min — ABNORMAL LOW (ref >60)
GLUCOSE: 128 mg/dL — AB (ref 65–99)
POTASSIUM: 3.6 mmol/L (ref 3.5–5.1)
SODIUM: 141 mmol/L (ref 135–145)
Total Bilirubin: 0.7 mg/dL (ref 0.3–1.2)
Total Protein: 7 g/dL (ref 6.5–8.1)

## 2016-03-23 LAB — URINE CULTURE: CULTURE: NO GROWTH

## 2016-03-23 LAB — BASIC METABOLIC PANEL
ANION GAP: 12 (ref 5–15)
ANION GAP: 12 (ref 5–15)
BUN: 36 mg/dL — ABNORMAL HIGH (ref 6–20)
BUN: 33 mg/dL — ABNORMAL HIGH (ref 6–20)
CO2: 27 mmol/L (ref 22–32)
CO2: 27 mmol/L (ref 22–32)
Calcium: >15 mg/dL (ref 8.9–10.3)
Chloride: 101 mmol/L (ref 101–111)
Chloride: 103 mmol/L (ref 101–111)
Creatinine, Ser: 1.54 mg/dL — ABNORMAL HIGH (ref 0.61–1.24)
Creatinine, Ser: 1.43 mg/dL — ABNORMAL HIGH (ref 0.61–1.24)
GFR calc Af Amer: >60 mL/min (ref >60)
GFR calc non Af Amer: 52 mL/min — ABNORMAL LOW (ref >60)
GFR, EST AFRICAN AMERICAN: 55 mL/min — AB (ref >60)
GFR, EST NON AFRICAN AMERICAN: 48 mL/min — AB (ref >60)
GLUCOSE: 116 mg/dL — AB (ref 65–99)
GLUCOSE: 134 mg/dL — AB (ref 65–99)
POTASSIUM: 3.8 mmol/L (ref 3.5–5.1)
POTASSIUM: 3.4 mmol/L — AB (ref 3.5–5.1)
Sodium: 140 mmol/L (ref 135–145)
Sodium: 142 mmol/L (ref 135–145)

## 2016-03-23 LAB — CBC
HEMATOCRIT: 41.9 % (ref 39.0–52.0)
HEMOGLOBIN: 13.5 g/dL (ref 13.0–17.0)
MCH: 29.7 pg (ref 26.0–34.0)
MCHC: 32.2 g/dL (ref 30.0–36.0)
MCV: 92.1 fL (ref 78.0–100.0)
Platelets: 222 10*3/uL (ref 150–400)
RBC: 4.55 MIL/uL (ref 4.22–5.81)
RDW: 13.8 % (ref 11.5–15.5)
WBC: 23.9 10*3/uL — ABNORMAL HIGH (ref 4.0–10.5)

## 2016-03-23 LAB — TROPONIN I
Troponin I: 0.06 ng/mL — ABNORMAL HIGH (ref <0.031)
Troponin I: 0.06 ng/mL — ABNORMAL HIGH (ref <0.031)

## 2016-03-23 LAB — CALCIUM, IONIZED: Calcium, Ionized, Serum: 9.4 mg/dL — ABNORMAL HIGH (ref 4.5–5.6)

## 2016-03-23 LAB — LACTATE DEHYDROGENASE: LDH: 325 U/L — ABNORMAL HIGH (ref 98–192)

## 2016-03-23 LAB — PARATHYROID HORMONE, INTACT (NO CA): PTH: 9 pg/mL — ABNORMAL LOW (ref 15–65)

## 2016-03-23 LAB — LACTIC ACID, PLASMA
LACTIC ACID, VENOUS: 2.9 mmol/L — AB (ref 0.5–2.0)
LACTIC ACID, VENOUS: 2.9 mmol/L — AB (ref 0.5–2.0)

## 2016-03-23 MED ORDER — DEXAMETHASONE SODIUM PHOSPHATE 4 MG/ML IJ SOLN
10.0000 mg | Freq: Two times a day (BID) | INTRAMUSCULAR | Status: DC
  Administered 2016-03-23: 10 mg via INTRAVENOUS
  Filled 2016-03-23: qty 2.5
  Filled 2016-03-23: qty 3

## 2016-03-23 MED ORDER — ENOXAPARIN SODIUM 40 MG/0.4ML ~~LOC~~ SOLN
40.0000 mg | SUBCUTANEOUS | Status: DC
  Administered 2016-03-23 – 2016-03-26 (×4): 40 mg via SUBCUTANEOUS
  Filled 2016-03-23 (×4): qty 0.4

## 2016-03-23 MED ORDER — VANCOMYCIN HCL IN DEXTROSE 750-5 MG/150ML-% IV SOLN
750.0000 mg | Freq: Two times a day (BID) | INTRAVENOUS | Status: DC
  Administered 2016-03-23 – 2016-03-24 (×3): 750 mg via INTRAVENOUS
  Filled 2016-03-23 (×6): qty 150

## 2016-03-23 MED ORDER — METOPROLOL TARTRATE 5 MG/5ML IV SOLN
2.5000 mg | Freq: Four times a day (QID) | INTRAVENOUS | Status: DC
  Administered 2016-03-23 – 2016-03-26 (×11): 2.5 mg via INTRAVENOUS
  Filled 2016-03-23 (×14): qty 5

## 2016-03-23 MED ORDER — METOPROLOL TARTRATE 5 MG/5ML IV SOLN
5.0000 mg | Freq: Once | INTRAVENOUS | Status: AC
  Administered 2016-03-23: 5 mg via INTRAVENOUS
  Filled 2016-03-23: qty 5

## 2016-03-23 MED ORDER — CALCITONIN (SALMON) 200 UNIT/ML IJ SOLN
8.0000 [IU]/kg | Freq: Four times a day (QID) | INTRAMUSCULAR | Status: AC
  Administered 2016-03-23 – 2016-03-24 (×4): 692 [IU] via INTRAMUSCULAR
  Filled 2016-03-23 (×5): qty 3.46

## 2016-03-23 MED ORDER — METOPROLOL TARTRATE 5 MG/5ML IV SOLN
2.5000 mg | Freq: Once | INTRAVENOUS | Status: AC
  Administered 2016-03-23: 2.5 mg via INTRAVENOUS
  Filled 2016-03-23: qty 5

## 2016-03-23 NOTE — Progress Notes (Signed)
Initial Nutrition Assessment  DOCUMENTATION CODES:   Not applicable  INTERVENTION:   Advance diet as medically appropriate, RD to add interventions accordingly  NUTRITION DIAGNOSIS:   Increased nutrient needs related to acute illness as evidenced by estimated needs  GOAL:   Patient will meet greater than or equal to 90% of their needs  MONITOR:   Diet advancement, PO intake, Weight trends, I & O's  REASON FOR ASSESSMENT:   Low Braden  ASSESSMENT:   60 y.o. Male of PMH of GERD, HTN, BPV, diastolic CHF, GAD, snoring who presents with SIRS w/o obvious source of infection, and acute encephalopathy likely from exreme hypercalcemia. IVF and calcitonin ordered. Recheck Ca at 16:00. HIgh level of concern for malignancy. H/O consulted for assistance in workup and treatment. Imaging thus far unrevealing for obvious malignancy though possibly in the perihilar region as noted on CTA. Additional workup and care plan as below.   Patient confused >> unable to obtain hx. Per Malnutrition Screening Tool Report, pt was eating poorly due to a decreased appetite. Currently NPO >> Low braden score places patient at risk for skin breakdown. Would benefit from oral nutrition supplements when/as able.  Unable to complete Nutrition-Focused physical exam at this time.   Diet Order:  Diet NPO time specified  Skin:  Reviewed, no issues  Last BM:  N/A  Height:   Ht Readings from Last 1 Encounters:  03/19/2016 5\' 7"  (1.702 m)    Weight:   Wt Readings from Last 1 Encounters:  03/23/16 181 lb (82.1 kg)    Ideal Body Weight:  61.3 kg  BMI:  Body mass index is 28.34 kg/(m^2).  Estimated Nutritional Needs:   Kcal:  2050-2250  Protein:  100-110 gm  Fluid:  2.0-2.2 L  EDUCATION NEEDS:   No education needs identified at this time  Arthur Holms, RD, LDN Pager #: [phone] After-Hours Pager #: [phone]

## 2016-03-23 NOTE — Progress Notes (Signed)
IP PROGRESS NOTE  Subjective:   He remains confused. His sister and wife are at the bedside. They report he is speaking some this morning and denies pain.  Objective: Vital signs in last 24 hours: Blood pressure 172/93, pulse 122, temperature 98.6 F (37 C), temperature source Oral, resp. rate 29, height 5\' 7"  (1.702 m), weight 181 lb (82.1 kg), SpO2 94 %.  Intake/Output from previous day: 06/08 0701 - 06/09 0700 In: 5553 [I.V.:5403; IV Piggyback:150] Out: 1300 [Urine:1300]  Physical Exam:  Lungs: Clear anteriorly, no respiratory distress Cardiac: Regular rate and rhythm Extremities: No leg edema Neurologic: Arousable with sternal rub, follows a few simple commands, moves all extremities, not speaking clearly    Lab Results:  Recent Labs  04/04/2016 0809 03/23/16 0705  WBC 24.9* 23.9*  HGB 15.3 13.5  HCT 45.9 41.9  PLT 232 222    BMET  Recent Labs  03/31/2016 1840 03/23/16 0705  NA 136 140  K 4.6 3.8  CL 101 101  CO2 25 27  GLUCOSE 131* 116*  BUN 37* 36*  CREATININE 1.52* 1.54*  CALCIUM >15.0* >15.0*    Studies/Results: Ct Abdomen Pelvis Wo Contrast  03/27/2016  CLINICAL DATA:  Altered mental status.  Possible sepsis. EXAM: CT ABDOMEN AND PELVIS WITHOUT CONTRAST TECHNIQUE: Multidetector CT imaging of the abdomen and pelvis was performed following the standard protocol without IV contrast. COMPARISON:  None. FINDINGS: Mild dependent atelectasis. Lung bases are otherwise within normal limits No free air or free fluid. No stones are seen in the right kidney. There is a 5 mm stone in the left kidney on coronal image 63. No other renal stones. No hydronephrosis. Mild bilateral perinephric stranding is identified. This may be chronic as no acute cause is seen. No ureterectasis or ureteral stones. The liver is normal. The gallbladder is distended with high attenuation layering posteriorly. The gallbladder wall and pericholecystic region are not well assessed due to motion.  The spleen, adrenal glands, and pancreas are normal. The abdominal aorta is atherosclerotic and tortuous but non aneurysmal. No adenopathy is identified. There is a small hiatal hernia. The stomach and small bowel are normal. The colon there is fecal loading in the proximal colon. The colon and appendix are otherwise normal. No adenopathy identified. The pelvis demonstrates no abnormalities in the prostate, seminal vesicles, or bladder. No suspicious masses or adenopathy. No acute bony abnormalities. Degenerative changes are seen in the spine. IMPRESSION: 1. Distended gallbladder with layering high attenuation dependently suggests a gallbladder with sludge or stones. An ultrasound could better evaluate. 2. 5 mm nonobstructive stone in the left kidney. Electronically Signed   By: Dorise Bullion III M.D   On: 03/17/2016 13:15   Ct Head Wo Contrast  03/17/2016  CLINICAL DATA:  Altered mental status EXAM: CT HEAD WITHOUT CONTRAST TECHNIQUE: Contiguous axial images were obtained from the base of the skull through the vertex without intravenous contrast. COMPARISON:  Brain MRI October 19, 2013 FINDINGS: The ventricles are normal in size and configuration. There is no intracranial mass, hemorrhage, extra-axial fluid collection, or midline shift. There is mild small vessel disease in the centra semiovale bilaterally. There is evidence of a small age uncertain lacunar type infarct in the anterior left lentiform nucleus. Gray-white compartments elsewhere are normal. Bony calvarium appears intact. The mastoid air cells are clear. No intraorbital lesions are identified. There is opacification of multiple ethmoid air cells bilaterally. There is opacification in the visualized right maxillary antrum as well as mucosal thickening noted  in the left maxillary antrum. IMPRESSION: Small age uncertain lacunar type infarct in the anterior left lentiform nucleus, best seen on axial slice 37 series 6. Elsewhere there is slight  periventricular small vessel disease. No hemorrhage or mass effect. There is multifocal paranasal sinus disease. Electronically Signed   By: Lowella Grip III M.D.   On: 04/13/2016 11:54   Dg Chest Port 1 View  04/08/2016  CLINICAL DATA:  Altered mental status EXAM: PORTABLE CHEST 1 VIEW COMPARISON:  03/19/2016; 03/16/2016; chest CT - 03/16/2016 FINDINGS: Grossly unchanged borderline enlarged cardiac silhouette and mediastinal contours with slight differences likely attributable to decreased lung volumes and AP projection. The pulmonary vasculature is indistinct with cephalization of flow. Minimal perihilar and medial basilar opacities favored to represent atelectasis. No pleural effusion or pneumothorax. No acute osseous abnormalities. IMPRESSION: Go suspected mild pulmonary edema on this hypoventilated AP portable examination. Electronically Signed   By: Sandi Mariscal M.D.   On: 03/26/2016 08:25    Medications: I have reviewed the patient's current medications.  Assessment/Plan:  1. Hypercalcemia 2. Mental status secondary to #1 3. Chest pain-likely secondary to a rib fracture and potentially bone metastases 4. COPD 5. Mediastinal lymphadenopathy 6. Leukocytosis 7. Fever  [name] has persistent severe hypercalcemia despite intravenous hydration, Zometa, and calcitonin. I will increase the calcitonin dose and repeat a calcium level later today. We will ask the lab if they can report a specific calcium value. We can consider additional measures to lower the calcium if the hypercalcemia is not improved tomorrow.  The altered mental status is likely related to hypercalcemia, though he may have a systemic infection. It is more likely the fever, leukocytosis, and hypercalcemia are related to a malignancy.  I reviewed the chest CT images with cardiothoracic surgery. We will consult CVTS to consider a bronchoscopy/EBUS versus a mediastinoscopy when the hypercalcemia improves. We will check the  LDH today.  Recommendations: 1. Continue intravenous hydration and calcitonin 2. Close follow-up of the serum calcium level 3. Continue broad-spectrum antibiotics and follow-up cultures 4. Consult CVTS 5. Bone scan when his mental status has improved  Oncology will see him daily.   LOS: 1 day   Betsy Coder, MD   03/23/2016, 8:10 AM

## 2016-03-23 NOTE — Progress Notes (Addendum)
Patient ID: [name], male   DOB: [birth year], 60 y.o.   MRN: [id number]  PROGRESS NOTE    [name]  TIR:[id number] DOB: 1956-02-07 DOA: 03/21/2016  PCP: Scarlette Calico, MD   Brief Narrative:  60 y.o. male with past medical history significant for hypertension, COPD, dyslipidemia, chronic kidney disease who presented to [facility] with progressive weakness, confusion and poor by mouth intake. He has been feeling poorly for couple of days prior to this admission but worsening in past 1-2 days. Family members also reported that patient complained of pain in the right chest wall and back.  Pt was seen in the emergency room on 03/16/2016 with chest pain and dyspnea and was treated for atypical pneumonia.  A CT of the chest revealed no evidence of pulmonary embolism but there was right hilar and mediastinal lymph nodes noted. He was subsequently discharged home.    Pt then saw Dr. Ronnald Ramp 03/19/2016 and complained of a cough, left chest wall pain, night sweats. Considering lymphadenopathy, elevated white blood cell count, sedimentation rate of 68 and calcium level of 14, concern was for possible flareup of sarcoidosis and patient was given steroids. Serum protein electrophoresis  was ordered. Flow cytometry was negative for lymphoproliferative disease and referral was made for lymph node biopsy with pulmonary.  On admission his blood pressure was as high as 197/106, heart rate 91-120, RR 12-26, T max 99.7 F. Blood work was notable for white blood cell count 24.9, creatinine 1.97, calcium more than 15, pro-calcitonin 0.37 and lactic acid 2.9. Repeat calcium level again above 15 but no actual value available.  Chest x-ray on the admission showed suspected mild pulmonary edema. CT head showed uncertain lacunar type infarct in the anterior left lentiform nucleus but no hemorrhage or mass effect. CT abdomen/pelvis without contrast showed distended gallbladder with layering high attenuation suggestive  of gallbladder with sludge or stones with recommendation that ultrasound could evaluate this better.   He was started on IV fluids, calcitonin for hypercalcemia. He was also started on empiric treatment with vancomycin and Zosyn empirically as SIRS criteria met but no evidence of acute infectious process.    Assessment & Plan:   Acute metabolic encephalopathy in the setting of hypercalcemia - Calcium level elevated, above 15 but no actual value available in Epic - Patient was started on IV fluids, calcitonin, the dose of which was increased since the admission since calcium level still reported above 15 - Zoledronic acid also given on admission  - Patient's mental status is still altered however per family member at the bedside patient seems to be more alert - Hypercalcemia highly concerning for underlying malignancy. PTH 9. Check PTH rP - Dr. Benay Spice of oncology has seen the patient in consultation, recommend continuing to treat hypercalcemia aggressively - Bone scan ordered  - Will ask for CTS consult for eventual need for LN biopsy   Acute renal failure superimposed on chronic kidney disease stage 3 - Baseline Cr 1.56 about 9 months ago  - Cr on this admission 1.97, likely due to prerenal etiology, dehydration - Continue IV fluids - Follow up BMP in am  Sepsis, unspecified organism  - Sepsis criteria met on admission with tachycardia, tachypnea, hypoxia, leukocytosis, lactic acidosis - No identifiable source of infection - CXR without acute cardiopulmonary process - UA unremarkable on admission - Empiric vanco and zosyn started - Follow up blood culture results  Essential hypertension - Order placed for metoprolol 2.5 mg IV every 6 hours  COPD -  Stable respiratory status   Hyperlipidemia - Holding zocor until pt able to take PO   Chronic diastolic heart failure - Monitor for volume overload as pt on hig rate IV fluids - 2 D ECHO in 07/2015 with preserved EF, grade 1  diastolic dysfunction   Lacunar infarcts of 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  Objective: Filed Vitals:   03/16/2016 1909 03/21/2016 2000 03/23/16 0000 03/23/16 0400  BP: 134/97 158/88 156/89 161/95  Pulse: 116 111 109 117  Temp: 99.7 F (37.6 C) 98.1 F (36.7 C) 98.5 F (36.9 C) 99.7 F (37.6 C)  TempSrc: Axillary Oral Oral Axillary  Resp: 22 21 22 25   Height: 5' 7"  (1.702 m)     Weight: 81.7 kg (180 lb 1.9 oz)   82.1 kg (181 lb)  SpO2: 92% 93% 94% 93%    Intake/Output Summary (Last 24 hours) at 03/23/16 0640 Last data filed at 03/23/16 0600  Gross per 24 hour  Intake   5553 ml  Output   1300 ml  Net   4253 ml   Filed Weights   03/15/2016 1909 03/23/16 0400  Weight: 81.7 kg (180 lb 1.9 oz) 82.1 kg (181 lb)    Examination:  General exam: Slightly restless, no acute distress Respiratory system: Clear to auscultation. Respiratory effort normal. Cardiovascular system: Tachycardic, rhythm regular, appreciate S1-S2 Gastrointestinal system: Abdomen is nondistended, soft and nontender. No organomegaly or masses felt. Normal bowel sounds heard. Central nervous system: Patient restless, more alert per family members but not able to follow any commands Extremities: No edema, pulses palpable Skin: No rashes, lesions or ulcers Psychiatry: Due to altered mental status unable to assess mental status  Data Reviewed: I have personally reviewed following labs and imaging  studies  CBC:  Recent Labs Lab 03/16/16 1208 03/19/16 0947 03/28/2016 0809  WBC 17.5* 23.0 Repeated and verified X2.* 24.9*  NEUTROABS  --  15.6* 18.4*  HGB 13.0 14.0 15.3  HCT 40.3 41.7 45.9  MCV 93.5 90.6 91.4  PLT 287 296.0 329   Basic Metabolic Panel:  Recent Labs Lab 03/16/16 1208 03/19/16 0947 03/24/2016 0809 04/11/2016 1840  NA 137 136 133* 136  K 4.0 4.2 4.8 4.6  CL 106 99 94* 101  CO2 22 26 24 25   GLUCOSE 134* 120* 123* 131*  BUN 17 25* 44* 37*  CREATININE 1.51* 1.67* 1.97* 1.52*  CALCIUM 11.1* 14.2* >15.0* >15.0*   GFR: Estimated Creatinine Clearance: 53.7 mL/min (by C-G formula based on Cr of 1.52). Liver Function Tests:  Recent Labs Lab 03/19/16 0947 03/23/2016 0809  AST 16 27  ALT 11 19  ALKPHOS 131* 113  BILITOT 0.3 0.8  PROT 7.9 7.6  ALBUMIN 3.8 3.2*   No results for input(s): LIPASE, AMYLASE in the last 168 hours.  Recent Labs Lab 04/02/2016 0845  AMMONIA 26   Coagulation Profile:  Recent Labs Lab 03/21/2016 1204  INR 1.17   Cardiac Enzymes: No results  for input(s): CKTOTAL, CKMB, CKMBINDEX, TROPONINI in the last 168 hours. BNP (last 3 results) No results for input(s): PROBNP in the last 8760 hours. HbA1C: No results for input(s): HGBA1C in the last 72 hours. CBG: No results for input(s): GLUCAP in the last 168 hours. Lipid Profile: No results for input(s): CHOL, HDL, LDLCALC, TRIG, CHOLHDL, LDLDIRECT in the last 72 hours. Thyroid Function Tests: No results for input(s): TSH, T4TOTAL, FREET4, T3FREE, THYROIDAB in the last 72 hours. Anemia Panel: No results for input(s): VITAMINB12, FOLATE, FERRITIN, TIBC, IRON, RETICCTPCT in the last 72 hours. Urine analysis:    Component Value Date/Time   COLORURINE YELLOW 04/09/2016 0922   APPEARANCEUR CLEAR 03/15/2016 0922   LABSPEC 1.013 03/28/2016 0922   PHURINE 5.0 04/03/2016 0922   GLUCOSEU NEGATIVE 03/19/2016 0922   GLUCOSEU NEGATIVE 09/15/2013 0913   HGBUR SMALL* 03/28/2016 0922    BILIRUBINUR NEGATIVE 04/06/2016 0922   KETONESUR NEGATIVE 03/23/2016 0922   PROTEINUR NEGATIVE 03/21/2016 0922   UROBILINOGEN 0.2 09/15/2013 0913   NITRITE NEGATIVE 03/17/2016 0922   LEUKOCYTESUR NEGATIVE 03/24/2016 0922   Sepsis Labs: @LABRCNTIP (procalcitonin:4,lacticidven:4)  Recent Results (from the past 240 hour(s))  MRSA PCR Screening     Status: None   Collection Time: 03/23/2016  7:33 PM  Result Value Ref Range Status   MRSA by PCR NEGATIVE NEGATIVE Final      Radiology Studies: Ct Abdomen Pelvis Wo Contrast 03/31/2016  1. Distended gallbladder with layering high attenuation dependently suggests a gallbladder with sludge or stones. An ultrasound could better evaluate. 2. 5 mm nonobstructive stone in the left kidney.   Dg Chest 2 View 03/19/2016  COPD. Trace of pleural fluid at the left lung base. Subsegmental atelectasis or early pneumonia in the right lower lobe.  Ct Head Wo Contrast 4/0/0867  Small age uncertain lacunar type infarct in the anterior left lentiform nucleus, best seen on axial slice 37 series 6. Elsewhere there is slight periventricular small vessel disease. No hemorrhage or mass effect. There is multifocal paranasal sinus disease.   Dg Chest Port 1 View 04/11/2016  Suspected mild pulmonary edema on this hypoventilated AP portable examination.     Scheduled Meds: . calcitonin  4 Units/kg Intramuscular Q12H  . enoxaparin (LOVENOX) injection  30 mg Subcutaneous Q24H  . piperacillin-tazobactam (ZOSYN)  IV  3.375 g Intravenous Q8H  . sodium chloride flush  3 mL Intravenous Q12H  . vancomycin  1,250 mg Intravenous Q24H   Continuous Infusions: . sodium chloride 200 mL/hr at 03/23/16 0425     LOS: 1 day    Time spent: 25 minutes  Greater than 50% of the time spent on counseling and coordinating the care.   Leisa Lenz, MD [facility] Pager [phone]  If 7PM-7AM, please contact night-coverage [email or web address] Password [email or web address] 03/23/2016, 6:40 AM

## 2016-03-23 NOTE — Consult Note (Signed)
[name] Admit Date: 03/21/2016 03/23/2016 Rexene Agent Requesting Physician:  Charlies Silvers MD  Reason for Consult:  Hypercalcemia HPI:  60 year old man admitted on 6/8 with confusion, weakness, lethargy and found to have severe hypercalcemia.Also with SIRS findings and placed on Vanc/Zosyn, cultures negatiev.  Has 40y hx/ tobacco use, known COPD.  Pt not contributory, sister in room unaware of any OTC antacid use.  Med list as outpt reviewed, no associated meds with hypercalcemia. CT C/A/P with findings of LAN and plan for bronch with Bx next week.  Tx for Hypercalcemia has been calcitonin start 6/8 1300, pamidronate given PM 6/8. NS now at 236mL/hr.  Has mild CKD, stable at baseline.  Excellent UOP.  Hx/o dCHF gr1 but no sig resp issues.  PTH is suppressed at 9.  No phos checked.  SPEP/sFLC pending. PTH-RP pending.    Renal US with 34mm L nonobstructing stone, no HN.     CREATININE, SER (mg/dL)  Date Value  03/23/2016 1.43*  03/23/2016 1.41*  03/23/2016 1.54*  04/12/2016 1.52*  04/10/2016 1.97*  03/19/2016 1.67*  03/16/2016 1.51*  06/01/2015 1.56*  09/15/2013 1.6*  03/17/2013 1.6*  ]  CALCIUM (mg/dL)  Date Value  03/23/2016 >15.0*  03/23/2016 >15.0*  03/23/2016 >15.0*  03/17/2016 >15.0*  03/15/2016 >15.0*  03/19/2016 14.2*  03/16/2016 11.1*  06/01/2015 9.2  ] I/Os:  ROS Balance of 12 systems is negative w/ exceptions as above  PMH  Past Medical History  Diagnosis Date  . GERD (gastroesophageal reflux disease)   . Arthritis   . History of blood transfusion   . Allergy   . Essential hypertension, benign 03/17/2013  . LVH (left ventricular hypertrophy) due to hypertensive disease 07/07/2014  . Benign paroxysmal positional vertigo 10/06/2013    Jan 2015  MPRESSION: 1. No acute intracranial abnormality. 2. 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 3. Evidence of chronic right maxillary sinusitis. Possible  superimposed mild acute sinusitis    . Diastolic dysfunction [id number]  . Dyspnea 09/15/2013  . Ejection fraction < 50% 10/14/2013    Study Conclusions  - Left ventricle: The cavity size was normal. Wall thickness was increased in a pattern of moderate LVH. There was focal basal hypertrophy. Systolic function was normal. The estimated ejection fraction was in the range of 50% to 55%. Wall motion was normal; there were no regional wall motion abnormalities. - Left atrium: The atrium was mildly dilated. - Right atrium: The atrium was mildly dilated Overall Impression: Low risk stress nuclear study Thinning of the inferior wall not thought to be significant EF mildly depressed suggest MRI/Echo correlation .  LV Ejection Fraction: 49%. LV Wall Motion: Normal Wall Motion or Mildly decreased EF diffuse    . GAD (generalized anxiety disorder) 03/17/2013  . Snoring 03/17/2013   PSH History reviewed. No pertinent past surgical history. FH  Family History  Problem Relation Age of Onset  . Arthritis Other   . Diabetes Other   . Stroke Mother   . Hypertension Other   . Asthma Father   . Stroke Mother   . Hypertension Mother   . Diabetes Father   . Lung cancer Brother     was a smoker  . Emphysema Father     was a smoker   SH  reports that he quit smoking about 2 years ago. His smoking use included Cigarettes. He started smoking about 44 years ago. He has a 40 pack-year smoking history. He has never  used smokeless tobacco. He reports that he does not drink alcohol or use illicit drugs. Allergies  Allergies  Allergen Reactions  . Lisinopril     cough  . Tramadol     insomnia   Home medications Prior to Admission medications   Medication Sig Start Date End Date Taking? Authorizing Provider  ALPRAZolam Duanne Moron) 0.5 MG tablet TAKE ONE-HALF TO ONE TABLET BY MOUTH TWICE DAILY AS NEEDED FOR SLEEP 10/27/15  Yes Janith Lima, MD  amLODipine (NORVASC) 10 MG tablet TAKE ONE TABLET BY MOUTH ONCE DAILY  09/22/15  Yes Dorothy Spark, MD  aspirin EC 81 MG tablet Take 1 tablet (81 mg total) by mouth daily. 06/01/15  Yes Janith Lima, MD  Aspirin-Acetaminophen-Caffeine (EXCEDRIN PO) Take 2 tablets by mouth 4 (four) times daily.   Yes Historical Provider, MD  atorvastatin (LIPITOR) 20 MG tablet Take 1 tablet (20 mg total) by mouth daily. 06/01/15  Yes Janith Lima, MD  HYDROcodone-acetaminophen (NORCO/VICODIN) 5-325 MG tablet Take 1 tablet by mouth every 6 (six) hours as needed for moderate pain. 03/19/16  Yes Janith Lima, MD  levofloxacin (LEVAQUIN) 750 MG tablet Take 1 tablet (750 mg total) by mouth daily. 03/17/16  Yes Leo Grosser, MD  methylPREDNISolone (MEDROL DOSEPAK) 4 MG TBPK tablet TAKE AS DIRECTED 03/20/16  Yes Janith Lima, MD  mometasone (ELOCON) 0.1 % ointment Apply topically daily. 06/01/15  Yes Janith Lima, MD  naproxen sodium (ANAPROX) 220 MG tablet Take 220 mg by mouth as needed (for back pain).   Yes Historical Provider, MD  omeprazole (PRILOSEC) 40 MG capsule Take 40 mg by mouth daily.    Yes Historical Provider, MD  Umeclidinium Bromide (INCRUSE ELLIPTA) 62.5 MCG/INH AEPB Inhale 1 puff into the lungs daily. 09/21/15  Yes Janith Lima, MD    Current Medications Scheduled Meds: . calcitonin  8 Units/kg Intramuscular Q6H  . dexamethasone  10 mg Intravenous Q12H  . enoxaparin (LOVENOX) injection  40 mg Subcutaneous Q24H  . metoprolol  2.5 mg Intravenous Q6H  . piperacillin-tazobactam (ZOSYN)  IV  3.375 g Intravenous Q8H  . sodium chloride flush  3 mL Intravenous Q12H  . vancomycin  750 mg Intravenous Q12H   Continuous Infusions: . sodium chloride 200 mL/hr at 03/23/16 2147   PRN Meds:.acetaminophen **OR** acetaminophen, bisacodyl, hydrALAZINE, ondansetron **OR** ondansetron (ZOFRAN) IV, polyethylene glycol  CBC  Recent Labs Lab 03/19/16 0947 04/09/2016 0809 03/23/16 0705  WBC 23.0 Repeated and verified X2.* 24.9* 23.9*  NEUTROABS 15.6* 18.4*  --   HGB 14.0 15.3  13.5  HCT 41.7 45.9 41.9  MCV 90.6 91.4 92.1  PLT 296.0 232 [id number]   Basic Metabolic Panel  Recent Labs Lab 03/19/16 0947 03/15/2016 0809 03/26/2016 1840 03/23/16 0705 03/23/16 1136 03/23/16 1940  NA 136 133* 136 140 141 142  K 4.2 4.8 4.6 3.8 3.6 3.4*  CL 99 94* 101 101 103 103  CO2 26 24 25 27 27 27   GLUCOSE 120* 123* 131* 116* 128* 134*  BUN 25* 44* 37* 36* 33* 33*  CREATININE 1.67* 1.97* 1.52* 1.54* 1.41* 1.43*  CALCIUM 14.2* >15.0* >15.0* >15.0* >15.0* >15.0*    Physical Exam  Blood pressure 174/154, pulse 99, temperature 101.4 F (38.6 C), temperature source Axillary, resp. rate 28, height 5\' 7"  (1.702 m), weight 82.1 kg (181 lb), SpO2 95 %. GEN: confused, obtunded ENT: NCAT EYES: won't open CV: RRR PULM: CTAB  ABD: s/nt/nd SKIN: no rashes/lesions EXT:no edema   Assessment  51M presenting with severe hypercalcemia, hx/o tobacco use, AMS, CKD3, HTN, SIRS  1. Hypercalcemia, Severe; likely malignant.  Suppressed PTH 1. Agree with calcitonin, bisphosphonate, hydration 2. ? Use of denosumab, defer to oncology 3. With good UOP and preserved GFR would hold on HD at current time; expect therapies to be effective with time 2. Likely malignancy, LAN in chest, for LN Bx by bronch next week with thoracic surgery 3. HTN 4. CKD3  Plan 1. Inc NS to 327mL/hr 2. Follow q6h BMP 3. Check Phos  Pearson Grippe MD [phone] pgr 03/23/2016, 11:53 PM

## 2016-03-23 NOTE — Progress Notes (Signed)
Procedure(s) (LRB): VIDEO BRONCHOSCOPY WITH ENDOBRONCHIAL ULTRASOUND (N/A) Subjective: Patient examined and CT scan of chest personally reviewed 60 year old Caucasian male reformed smoker admitted with severe hypercalcemia and altered mental status. The patient does have significant mediastinal adenopathy along the distal trachea and subcarinal area. No suspicious discrete pulmonary masses. No palpable cervical adenopathy on exam. Agree that bronchoscopy and transbronchial biopsy of mediastinal adenopathy is indicated to evaluate for malignancy. We'll set this up for early next week-Tuesday, June 13 if his hypercalcemia is improved.  Objective: Vital signs in last 24 hours: Temp:  [98.1 F (36.7 C)-101.1 F (38.4 C)] 101.1 F (38.4 C) (06/09 1808) Pulse Rate:  [109-127] 127 (06/09 1200) Cardiac Rhythm:  [-] Sinus tachycardia (06/09 1335) Resp:  [21-30] 30 (06/09 1200) BP: (134-197)/(88-106) 189/100 mmHg (06/09 1808) SpO2:  [92 %-96 %] 94 % (06/09 1200) Weight:  [180 lb 1.9 oz (81.7 kg)-181 lb (82.1 kg)] 181 lb (82.1 kg) (06/09 0400)  Hemodynamic parameters for last 24 hours:  afebrile sinus rhythm  Intake/Output from previous day: 06/08 0701 - 06/09 0700 In: 5553 [I.V.:5403; IV Piggyback:150] Out: 1300 [Urine:1300] Intake/Output this shift: Total I/O In: -  Out: 1600 [Urine:1600]         Review of Systems :  [ y ] = yes, [  ] = no        General :  Weight gain [   yes]    Weight loss  [   ]  Fatigue [  ]  Fever [  ]  Chills  [  ]     positive for night sweats                           Weakness  [  ]           HEENT    Headache [  ]  Dizziness [  ]  Blurred vision [  ] Glaucoma  [  ]                          Nosebleeds [  ] Painful or loose teeth [  ]        Cardiac :  Chest pain/ pressure [  ]  Resting SOB [  ] exertional SOB [  ]                        Orthopnea [  ]  Pedal edema  [  ]  Palpitations [  ] Syncope/presyncope [ ]                         Paroxysmal nocturnal  dyspnea [  ]         Pulmonary : cough [place].Blacker  ]  wheezing [  ]  Hemoptysis [  ] Sputum [  ] Snoring [  ]                              Pneumothorax [  ]  Sleep apnea [  ]        GI : Vomiting [  ]  Dysphagia [  ]  Melena  [  ]  Abdominal pain [  ] BRBPR [  ]              Heart burn [  ]  Constipation [  ]  Diarrhea  [  ] Colonoscopy [   ]        GU : Hematuria [  ]  Dysuria [  ]  Nocturia [  ] UTI's [  ]        Vascular : Claudication [  ]  Rest pain [  ]  DVT [  ] Vein stripping [  ] leg ulcers [  ]                          TIA [  ] Stroke [  ]  Varicose veins [  ]        NEURO :  Headaches  [  ] Seizures [  ] Vision changes [  ] Paresthesias [  ]    significant altered mental status-encephalopathy with serum calcium greater than 15  on admission                                 Seizures [  ]        Musculoskeletal :  Arthritis [  ] Gout  [  ]  Back pain [  ]  Joint pain [  ]        Skin :  Rash [  ]  Melanoma [  ] Sores [  ]        Heme : Bleeding problems [  ]Clotting Disorders [  ] Anemia [  ]Blood Transfusion [ ]         Endocrine : Diabetes [  ] Heat or Cold intolerance [  ] Polyuria [  ]excessive thirst [ ]         Psych : Depression [  ]  Anxiety [  ]  Psych hospitalizations [  ] Memory change [  ]            Physical Exam  General: Well-developed middle-aged Caucasian male sedated with minimal responsive level of consciousness HEENT: Normocephalic pupils equal , dentition adequate Neck: Supple without JVD, adenopathy, or bruit Chest: Clear to auscultation, symmetrical breath sounds, no rhonchi, no tenderness             or deformity Cardiovascular: Regular rate and rhythm, no murmur, no gallop, peripheral pulses             palpable in all extremities Abdomen:  Soft, nontender, no palpable mass or organomegaly Extremities: Warm, well-perfused, no clubbing cyanosis edema or tenderness,              no venous stasis changes of the legs Rectal/GU: Deferred Neuro: Grossly  non--focal and symmetrical throughout Skin: Clean and dry without rash or ulceration   Lab Results:  Recent Labs  04/02/2016 0809 03/23/16 0705  WBC 24.9* 23.9*  HGB 15.3 13.5  HCT 45.9 41.9  PLT 232 222   BMET:  Recent Labs  03/23/16 0705 03/23/16 1136  NA 140 141  K 3.8 3.6  CL 101 103  CO2 27 27  GLUCOSE 116* 128*  BUN 36* 33*  CREATININE 1.54* 1.41*  CALCIUM >15.0* >15.0*    PT/INR:  Recent Labs  04/06/2016 1204  LABPROT 15.0  INR 1.17   ABG No results found for: PHART, HCO3, TCO2, ACIDBASEDEF, O2SAT CBG (last 3)  No results for input(s): GLUCAP in the last 72 hours.  Assessment/Plan: S/P Procedure(s) (LRB): VIDEO BRONCHOSCOPY WITH ENDOBRONCHIAL ULTRASOUND (N/A) Significant mediastinal peritracheal and  subcarinal adenopathy associated with severe hyper calcemia suspicious for malignancy.Burnis Medin plan bronchoscopy and ultrasound directed mediastinal node biopsy under general anesthesia after his hypercalcemia improves and mental status improves. Procedure tenderly planned for p.m. Tuesday, June 13. Procedure discussed with patient's family.   LOS: 1 day    Tharon Aquas Trigt III 03/23/2016

## 2016-03-23 NOTE — Consult Note (Signed)
Pharmacy Antibiotic Note  [name] is a 60 y.o. male who was seen in the ED last Friday 6/2 and diagnosed with possible pneumonia. He was sent home on levaquin. He returned to the ED today with AMS - code sepsis called.     Currently on vancomycin and zosyn dosing. Renal fx has improved with hydration. SCr down to baseline. WBC 23.9, afebrile   Goal vancomycin trough 15-20  Plan: 1) Increase Vancomycin to 750 mg IV Q 12 hours  2) Zosyn 3.375g IV q8 (4 hour infusion) 3) Follow renal function, cultures, LOT, level as needed  Height: 5\' 7"  (170.2 cm) Weight: 181 lb (82.1 kg) IBW/kg (Calculated) : 66.1 Temp (24hrs), Avg:99 F (37.2 C), Min:98.1 F (36.7 C), Max:99.7 F (37.6 C)   Recent Labs Lab 03/16/16 1208 03/19/16 0947 03/27/2016 0809  03/26/2016 1222 04/13/2016 1840 04/03/2016 2142 03/23/16 0039 03/23/16 0705  WBC 17.5* 23.0 Repeated and verified X2.* 24.9*  --   --   --   --   --  23.9*  CREATININE 1.51* 1.67* 1.97*  --   --  1.52*  --   --  1.54*  LATICACIDVEN  --   --   --   < > 2.81* 2.6* 2.9* 2.9* 2.9*  < > = values in this interval not displayed. Estimated creatinine clearance: 42 ml/min  Allergies  Allergen Reactions  . Lisinopril     cough  . Tramadol     insomnia    Antimicrobials this admission: 6/8 Vancomycin >> 6/8 Zosyn >>  Dose adjustments this admission: N/a  Microbiology results:  6/8 BCx2>> 6/8 UCx>>   Thank you for allowing pharmacy to be a part of this patient's care.  Albertina Parr, PharmD., BCPS Clinical Pharmacist Pager [phone]

## 2016-03-24 ENCOUNTER — Inpatient Hospital Stay (HOSPITAL_COMMUNITY): Payer: BLUE CROSS/BLUE SHIELD

## 2016-03-24 DIAGNOSIS — G9340 Encephalopathy, unspecified: Secondary | ICD-10-CM

## 2016-03-24 DIAGNOSIS — J9601 Acute respiratory failure with hypoxia: Secondary | ICD-10-CM | POA: Insufficient documentation

## 2016-03-24 DIAGNOSIS — N179 Acute kidney failure, unspecified: Secondary | ICD-10-CM

## 2016-03-24 DIAGNOSIS — E8770 Fluid overload, unspecified: Secondary | ICD-10-CM

## 2016-03-24 DIAGNOSIS — R590 Localized enlarged lymph nodes: Secondary | ICD-10-CM

## 2016-03-24 LAB — CBC
HEMATOCRIT: 38.9 % — AB (ref 39.0–52.0)
HEMOGLOBIN: 12.1 g/dL — AB (ref 13.0–17.0)
MCH: 29.4 pg (ref 26.0–34.0)
MCHC: 31.1 g/dL (ref 30.0–36.0)
MCV: 94.4 fL (ref 78.0–100.0)
Platelets: 172 10*3/uL (ref 150–400)
RBC: 4.12 MIL/uL — AB (ref 4.22–5.81)
RDW: 14 % (ref 11.5–15.5)
WBC: 18.4 10*3/uL — ABNORMAL HIGH (ref 4.0–10.5)

## 2016-03-24 LAB — BASIC METABOLIC PANEL
ANION GAP: 9 (ref 5–15)
Anion gap: 8 (ref 5–15)
Anion gap: 7 (ref 5–15)
BUN: 35 mg/dL — AB (ref 6–20)
BUN: 42 mg/dL — AB (ref 6–20)
BUN: 43 mg/dL — ABNORMAL HIGH (ref 6–20)
CHLORIDE: 107 mmol/L (ref 101–111)
CHLORIDE: 111 mmol/L (ref 101–111)
CO2: 28 mmol/L (ref 22–32)
CO2: 25 mmol/L (ref 22–32)
CO2: 25 mmol/L (ref 22–32)
CREATININE: 1.51 mg/dL — AB (ref 0.61–1.24)
Calcium: 14.1 mg/dL (ref 8.9–10.3)
Calcium: 13.8 mg/dL (ref 8.9–10.3)
Calcium: 13.3 mg/dL (ref 8.9–10.3)
Chloride: 113 mmol/L — ABNORMAL HIGH (ref 101–111)
Creatinine, Ser: 1.4 mg/dL — ABNORMAL HIGH (ref 0.61–1.24)
Creatinine, Ser: 1.54 mg/dL — ABNORMAL HIGH (ref 0.61–1.24)
GFR calc Af Amer: 57 mL/min — ABNORMAL LOW (ref >60)
GFR calc Af Amer: 55 mL/min — ABNORMAL LOW (ref >60)
GFR calc non Af Amer: 49 mL/min — ABNORMAL LOW (ref >60)
GFR, EST NON AFRICAN AMERICAN: 54 mL/min — AB (ref >60)
GFR, EST NON AFRICAN AMERICAN: 48 mL/min — AB (ref >60)
Glucose, Bld: 147 mg/dL — ABNORMAL HIGH (ref 65–99)
Glucose, Bld: 129 mg/dL — ABNORMAL HIGH (ref 65–99)
Glucose, Bld: 119 mg/dL — ABNORMAL HIGH (ref 65–99)
POTASSIUM: 3.5 mmol/L (ref 3.5–5.1)
POTASSIUM: 3.5 mmol/L (ref 3.5–5.1)
Potassium: 3.8 mmol/L (ref 3.5–5.1)
SODIUM: 144 mmol/L (ref 135–145)
SODIUM: 144 mmol/L (ref 135–145)
Sodium: 145 mmol/L (ref 135–145)

## 2016-03-24 LAB — POCT I-STAT 3, ART BLOOD GAS (G3+)
Acid-Base Excess: 3 mmol/L — ABNORMAL HIGH (ref 0.0–2.0)
Bicarbonate: 28.6 mEq/L — ABNORMAL HIGH (ref 20.0–24.0)
O2 SAT: 98 %
PCO2 ART: 46.6 mmHg — AB (ref 35.0–45.0)
PH ART: 7.397 (ref 7.350–7.450)
TCO2: 30 mmol/L (ref 0–100)
pO2, Arterial: 104 mmHg — ABNORMAL HIGH (ref 80.0–100.0)

## 2016-03-24 LAB — GLUCOSE, CAPILLARY
GLUCOSE-CAPILLARY: 120 mg/dL — AB (ref 65–99)
GLUCOSE-CAPILLARY: 130 mg/dL — AB (ref 65–99)
GLUCOSE-CAPILLARY: 112 mg/dL — AB (ref 65–99)
Glucose-Capillary: 136 mg/dL — ABNORMAL HIGH (ref 65–99)

## 2016-03-24 LAB — MAGNESIUM
Magnesium: 1.4 mg/dL — ABNORMAL LOW (ref 1.7–2.4)
Magnesium: 1.3 mg/dL — ABNORMAL LOW (ref 1.7–2.4)

## 2016-03-24 LAB — PHOSPHORUS
Phosphorus: 3.7 mg/dL (ref 2.5–4.6)
Phosphorus: 3.6 mg/dL (ref 2.5–4.6)
Phosphorus: 2.4 mg/dL — ABNORMAL LOW (ref 2.5–4.6)

## 2016-03-24 LAB — TROPONIN I: Troponin I: 0.06 ng/mL — ABNORMAL HIGH (ref <0.031)

## 2016-03-24 LAB — TRIGLYCERIDES: TRIGLYCERIDES: 339 mg/dL — AB (ref <150)

## 2016-03-24 MED ORDER — CHLORHEXIDINE GLUCONATE 0.12% ORAL RINSE (MEDLINE KIT)
15.0000 mL | Freq: Two times a day (BID) | OROMUCOSAL | Status: DC
  Administered 2016-03-24 – 2016-03-31 (×15): 15 mL via OROMUCOSAL

## 2016-03-24 MED ORDER — PROPOFOL 1000 MG/100ML IV EMUL
0.0000 ug/kg/min | INTRAVENOUS | Status: DC
  Administered 2016-03-24: 0 ug/kg/min via INTRAVENOUS
  Administered 2016-03-24: 20.097 ug/kg/min via INTRAVENOUS
  Administered 2016-03-24: 40 ug/kg/min via INTRAVENOUS
  Administered 2016-03-24: 20.097 ug/kg/min via INTRAVENOUS
  Administered 2016-03-24 – 2016-03-25 (×2): 30 ug/kg/min via INTRAVENOUS
  Administered 2016-03-25: 20.097 ug/kg/min via INTRAVENOUS
  Administered 2016-03-26: 30 ug/kg/min via INTRAVENOUS
  Administered 2016-03-26: 30.045 ug/kg/min via INTRAVENOUS
  Administered 2016-03-26: 30 ug/kg/min via INTRAVENOUS
  Administered 2016-03-26: 20 ug/kg/min via INTRAVENOUS
  Administered 2016-03-27 (×2): 30 ug/kg/min via INTRAVENOUS
  Administered 2016-03-27: 50 ug/kg/min via INTRAVENOUS
  Administered 2016-03-27: 30 ug/kg/min via INTRAVENOUS
  Administered 2016-03-28: 40 ug/kg/min via INTRAVENOUS
  Administered 2016-03-28 (×2): 50 ug/kg/min via INTRAVENOUS
  Administered 2016-03-28: 49.939 ug/kg/min via INTRAVENOUS
  Administered 2016-03-28: 50 ug/kg/min via INTRAVENOUS
  Administered 2016-03-29 (×3): 40 ug/kg/min via INTRAVENOUS
  Filled 2016-03-24 (×8): qty 100
  Filled 2016-03-24: qty 200
  Filled 2016-03-24 (×4): qty 100
  Filled 2016-03-24: qty 200
  Filled 2016-03-24 (×7): qty 100

## 2016-03-24 MED ORDER — PROPOFOL 1000 MG/100ML IV EMUL
INTRAVENOUS | Status: AC
  Administered 2016-03-24: 02:00:00
  Filled 2016-03-24: qty 100

## 2016-03-24 MED ORDER — PRO-STAT SUGAR FREE PO LIQD
30.0000 mL | Freq: Two times a day (BID) | ORAL | Status: DC
  Administered 2016-03-24 – 2016-03-28 (×7): 30 mL
  Filled 2016-03-24 (×12): qty 30

## 2016-03-24 MED ORDER — ANTISEPTIC ORAL RINSE SOLUTION (CORINZ)
7.0000 mL | Freq: Four times a day (QID) | OROMUCOSAL | Status: DC
  Administered 2016-03-24 – 2016-04-01 (×32): 7 mL via OROMUCOSAL

## 2016-03-24 MED ORDER — HEPARIN SODIUM (PORCINE) 1000 UNIT/ML IJ SOLN
5000.0000 [IU] | Freq: Once | INTRAMUSCULAR | Status: AC
  Administered 2016-03-24: 3000 [IU] via INTRAVENOUS
  Filled 2016-03-24: qty 5

## 2016-03-24 MED ORDER — CALCITONIN (SALMON) 200 UNIT/ML IJ SOLN
660.0000 [IU] | Freq: Four times a day (QID) | INTRAMUSCULAR | Status: AC
  Administered 2016-03-24 – 2016-03-25 (×4): 660 [IU] via INTRAMUSCULAR
  Filled 2016-03-24 (×5): qty 3.3

## 2016-03-24 MED ORDER — VITAL 1.5 CAL PO LIQD
1000.0000 mL | ORAL | Status: DC
  Administered 2016-03-26 – 2016-03-29 (×5): 1000 mL
  Filled 2016-03-24 (×8): qty 1000

## 2016-03-24 MED ORDER — VITAL HIGH PROTEIN PO LIQD
1000.0000 mL | ORAL | Status: DC
  Administered 2016-03-24: 1000 mL

## 2016-03-24 MED ORDER — PANTOPRAZOLE SODIUM 40 MG PO PACK
40.0000 mg | PACK | Freq: Every day | ORAL | Status: DC
  Administered 2016-03-24 – 2016-03-31 (×6): 40 mg
  Filled 2016-03-24 (×8): qty 20

## 2016-03-24 NOTE — Procedures (Signed)
Endotracheal Intubation Procedure Note  Indication for endotracheal intubation: airway compromise. Airway Assessment: Mallampati Class: II (hard and soft palate, upper portion of tonsils anduvula visible). Sedation: propofol. Paralytic: succinylcholine. Lidocaine: no. Atropine: no. Equipment: Macintosh 3 laryngoscope blade and 7.66mm cuffed endotracheal tube. Cricoid Pressure: no. Number of attempts: 1. ETT location confirmed by by auscultation, by CXR and ETCO2 monitor.  [name], Marjory Lies 03/24/2016

## 2016-03-24 NOTE — Plan of Care (Signed)
Problem: Education: Goal: Knowledge of [facility] Education information/materials will improve Outcome: Progressing Discussed at the beginning of the shift concern for Calcium >15 and potential for dialysis (temporary) and transfer to ICU tonight with wife and sister in the room.

## 2016-03-24 NOTE — Progress Notes (Signed)
03/24/16 0120  Upon arrival to unit patient having trouble breathing and concerns about airway protection. CCM and RT at bedside. CCM to intubate patient.   Wyn Quaker RN

## 2016-03-24 NOTE — Significant Event (Addendum)
Rapid Response Event Note  Overview: Time Called: 0013 Arrival Time: 0015 Event Type: Other (Comment)  Initial Focused Assessment:  Received call from Patient placement, patient was being transferred to ICU, ICU was being requested.  Upon assessing the patient, patient was obtunded, responded to pain, hypertensive, tachycardiac, and febrile (APAP supp given).  Oxygen sats greater than 93% on 4.  Per bedside RN, CCM was already consulted and patient will need emergent dialysis (CRRT).  Patient has a calcium greater than 15, and has received calcitonin, IVF hydration, and is on IV ABX for SIRS of unknown cause.  Interventions: - Attempted PIV - Foley placed - Pt transferred to MICU per CCM MD   Event Summary: Name of Physician Notified: Primary MD notified by bedside RN   Outcome: Transferred to MICU    Katsumi Wisler, [place]

## 2016-03-24 NOTE — Progress Notes (Signed)
Nutrition Follow-up  DOCUMENTATION CODES:   Not applicable  INTERVENTION:  - If TF warranted, recommend Vital 1.5 @ 50 mL/hr with 30 mL Prostat BID. Begin TF at @ 30 mL/hr and advance by 10 mL every 4 hours to reach goal rate of 50 mL/hr. At goal rate, this regimen provide 2000 kcal, 111 grams of protein, and 917 mL free water.  (Will provide 1200 mg Ca). - RD will continue to follow medical course and provide interventions as warranted.  NUTRITION DIAGNOSIS:   Increased nutrient needs related to acute illness as evidenced by estimated needs. -ongoing  GOAL:   Patient will meet greater than or equal to 90% of their needs -unmet  MONITOR:   Vent status, Weight trends, Labs, Skin, I & O's  REASON FOR ASSESSMENT:   Ventilator  ASSESSMENT:   60 y.o. Male of PMH of GERD, HTN, BPV, diastolic CHF, GAD, snoring who presents with SIRS w/o obvious source of infection, and acute encephalopathy likely from exreme hypercalcemia. IVF and calcitonin ordered. Recheck Ca at 16:00. HIgh level of concern for malignancy. H/O consulted for assistance in workup and treatment. Imaging thus far unrevealing for obvious malignancy though possibly in the perihilar region as noted on CTA. Additional workup and care plan as below.   6/10 Pt seen for full assessment yesterday. Pt had bronch yesterday. He was moved to MICU for urgent HD d/t hypercalcemia and HD catheter now in place; per nephrology note this AM plan is to currently hold CRRT. Will continue to monitor plan concerning this and make nutrition-related adjustments as needed.   Spoke with family at bedside. Physical assessment shows no muscle or fat wasting, mild to moderate edema specifically to ULE. Nutrition needs re-estimated for intubation/current vent settings and Tmax and based on weight of 82.1 kg from 03/23/16.   Patient is currently intubated on ventilator support with OGT in place MV: 8.9 L/min Temp (24hrs), Avg:100.1 F (37.8 C),  Min:98.9 F (37.2 C), Max:101.4 F (38.6 C) Propofol: none  TF recommendations outlined above should they be warranted. Not able to meet needs at this time. Medications reviewed. Labs reviewed; BUN/creatinine elevated, Ca: 14.1 mg/dL (down from yesterday), GFR: 54.  IVF: NS @ 300 mL/hr.   6/9 Patient confused >> unable to obtain hx. Per Malnutrition Screening Tool Report, pt was eating poorly due to a decreased appetite. Currently NPO >> Low braden score places patient at risk for skin breakdown. Would benefit from oral nutrition supplements when/as able.  Unable to complete Nutrition-Focused physical exam at this time.    Diet Order:  Diet NPO time specified  Skin:  Reviewed, no issues  Last BM:  6/10  Height:   Ht Readings from Last 1 Encounters:  03/24/16 5\' 8"  (1.727 m)    Weight:   Wt Readings from Last 1 Encounters:  03/23/16 181 lb (82.1 kg)    Ideal Body Weight:  61.3 kg  BMI:  Body mass index is 27.53 kg/(m^2).  Estimated Nutritional Needs:   Kcal:  2060  Protein:  99-123 grams (1.2-1.5 grams/kg)  Fluid:  2.0-2.2 L  EDUCATION NEEDS:   No education needs identified at this time     Jarome Matin, RD, LDN Inpatient Clinical Dietitian Pager # [phone] After hours/weekend pager # [phone]

## 2016-03-24 NOTE — Progress Notes (Signed)
Admit: 04/05/2016 LOS: 2  34M presenting with severe hypercalcemia, hx/o tobacco use, AMS, CKD3, HTN, SIRS  Subjective:  Intubated overnight for AMS Good UOP  CXR w/o pulm edema this AM  06/09 0701 - 06/10 0700 In: 6012.7 [I.V.:5562.7; IV Piggyback:450] Out: 8416 [Urine:3425; Emesis/NG output:50]  Filed Weights   03/30/2016 1909 03/23/16 0400  Weight: 81.7 kg (180 lb 1.9 oz) 82.1 kg (181 lb)    Scheduled Meds: . antiseptic oral rinse  7 mL Mouth Rinse QID  . chlorhexidine gluconate (SAGE KIT)  15 mL Mouth Rinse BID  . dexamethasone  10 mg Intravenous Q12H  . enoxaparin (LOVENOX) injection  40 mg Subcutaneous Q24H  . metoprolol  2.5 mg Intravenous Q6H  . piperacillin-tazobactam (ZOSYN)  IV  3.375 g Intravenous Q8H  . sodium chloride flush  3 mL Intravenous Q12H  . vancomycin  750 mg Intravenous Q12H   Continuous Infusions: . sodium chloride 300 mL/hr at 03/23/16 2358  . propofol (DIPRIVAN) infusion 40 mcg/kg/min (03/24/16 0402)   PRN Meds:.acetaminophen **OR** acetaminophen, bisacodyl, hydrALAZINE, ondansetron **OR** ondansetron (ZOFRAN) IV, polyethylene glycol  Current Labs: reviewed   Relevant Labs PTH 9 1,25 Vit D pending PTH-RP pending SPEP and sFLC pending  Physical Exam:  Blood pressure 107/69, pulse 78, temperature 99.9 F (37.7 C), temperature source Oral, resp. rate 16, height 5' 8"  (1.727 m), weight 82.1 kg (181 lb), SpO2 97 %. GEN: intubated/sedated ENT: NCAT EYES: won't open CV: RRR PULM: CTAB  ABD: s/nt/nd SKIN: no rashes/lesions EXT:no edema   Assessment  1. Hypercalcemia, Severe; likely malignant. Suppressed PTH 1. Agree with calcitonin, bisphosphonate, hydration 2. ? Use of denosumab, defer to oncology 3. With good UOP and preserved GFR would hold on HD at current time; expect therapies to be effective with time 2. Likely malignancy, LAN in chest, for LN Bx by bronch next week with thoracic surgery 3. HTN 4. CKD3  Plan 1. Some  improvement this AM,  2. Aggressive hydration, Cont NS to 372m/hr 3. PTHrp and 1,25 Vit D pending, nl Phos leans towards 1,25 Vit D process 4. Follow q6h BMP  RPearson GrippeMD 03/24/2016, 7:14 AM   Recent Labs Lab 03/23/16 1136 03/23/16 1940 03/24/16 0149 03/24/16 0207  NA 141 142 144  --   K 3.6 3.4* 3.5  --   CL 103 103 107  --   CO2 27 27 28   --   GLUCOSE 128* 134* 147*  --   BUN 33* 33* 35*  --   CREATININE 1.41* 1.43* 1.40*  --   CALCIUM >15.0* >15.0* 14.1*  --   PHOS  --   --   --  3.7    Recent Labs Lab 03/19/16 0947 04/08/2016 0809 03/23/16 0705 03/24/16 0207  WBC 23.0 Repeated and verified X2.* 24.9* 23.9* 18.4*  NEUTROABS 15.6* 18.4*  --   --   HGB 14.0 15.3 13.5 12.1*  HCT 41.7 45.9 41.9 38.9*  MCV 90.6 91.4 92.1 94.4  PLT 296.0 232 222 172

## 2016-03-24 NOTE — H&P (Addendum)
PULMONARY / CRITICAL CARE MEDICINE   Name: [name] MRN: [id number] DOB: 1956-08-17    ADMISSION DATE:  03/28/2016 CONSULTATION DATE:  03/23/16  CHIEF COMPLAINT:  Altered mental status  HISTORY OF PRESENT ILLNESS:   See H&P from Dr. Chester Holstein for thorough history. Breif[place], [name] presented with progressive AMS, and was found to have mediastinal adenopathy and hypercalcemia, which was refractory of IVF, calcitonin, and bisphosphonate therapy. He was taken to the ICU for further treatment.  PAST MEDICAL HISTORY :  He  has a past medical history of GERD (gastroesophageal reflux disease); Arthritis; History of blood transfusion; Allergy; Essential hypertension, benign (03/17/2013); LVH (left ventricular hypertrophy) due to hypertensive disease (07/07/2014); Benign paroxysmal positional vertigo (10/06/2013); Diastolic dysfunction ([id number]); Dyspnea (09/15/2013); Ejection fraction < 50% (10/14/2013); GAD (generalized anxiety disorder) (03/17/2013); and Snoring (03/17/2013).  PAST SURGICAL HISTORY: He  has no past surgical history on file.  Allergies  Allergen Reactions  . Lisinopril     cough  . Tramadol     insomnia    No current facility-administered medications on file prior to encounter.   Current Outpatient Prescriptions on File Prior to Encounter  Medication Sig  . ALPRAZolam (XANAX) 0.5 MG tablet TAKE ONE-HALF TO ONE TABLET BY MOUTH TWICE DAILY AS NEEDED FOR SLEEP  . amLODipine (NORVASC) 10 MG tablet TAKE ONE TABLET BY MOUTH ONCE DAILY  . aspirin EC 81 MG tablet Take 1 tablet (81 mg total) by mouth daily.  . Aspirin-Acetaminophen-Caffeine (EXCEDRIN PO) Take 2 tablets by mouth 4 (four) times daily.  Marland Kitchen atorvastatin (LIPITOR) 20 MG tablet Take 1 tablet (20 mg total) by mouth daily.  Marland Kitchen HYDROcodone-acetaminophen (NORCO/VICODIN) 5-325 MG tablet Take 1 tablet by mouth every 6 (six) hours as needed for moderate pain.  Marland Kitchen levofloxacin (LEVAQUIN) 750 MG tablet Take 1 tablet (750 mg total) by  mouth daily.  . methylPREDNISolone (MEDROL DOSEPAK) 4 MG TBPK tablet TAKE AS DIRECTED  . mometasone (ELOCON) 0.1 % ointment Apply topically daily.  . naproxen sodium (ANAPROX) 220 MG tablet Take 220 mg by mouth as needed (for back pain).  Marland Kitchen omeprazole (PRILOSEC) 40 MG capsule Take 40 mg by mouth daily.   Marland Kitchen Umeclidinium Bromide (INCRUSE ELLIPTA) 62.5 MCG/INH AEPB Inhale 1 puff into the lungs daily.    FAMILY HISTORY:  His indicated that his mother is deceased. He indicated that his father is deceased. He indicated that his sister is alive. He indicated that his brother is deceased.   SOCIAL HISTORY: He  reports that he quit smoking about 2 years ago. His smoking use included Cigarettes. He started smoking about 44 years ago. He has a 40 pack-year smoking history. He has never used smokeless tobacco. He reports that he does not drink alcohol or use illicit drugs.  REVIEW OF SYSTEMS:   Unable to obtain 2/2 AMS  SUBJECTIVE:  N/A  VITAL SIGNS: BP 100/65 mmHg  Pulse 78  Temp(Src) 99.9 F (37.7 C) (Oral)  Resp 16  Ht 5\' 8"  (1.727 m)  Wt 181 lb (82.1 kg)  BMI 27.53 kg/m2  SpO2 97%  HEMODYNAMICS:    VENTILATOR SETTINGS: Vent Mode:  [-] PRVC FiO2 (%):  [50 %-60 %] 50 % Set Rate:  [16 bmp] 16 bmp Vt Set:  [550 mL] 550 mL PEEP:  [5 cmH20] 5 cmH20 Plateau Pressure:  [12 cmH20-14 cmH20] 14 cmH20  INTAKE / OUTPUT: I/O last 3 completed shifts: In: [id number] [I.V.:5403; IV Piggyback:150] Out: 2900 [Urine:2900]  PHYSICAL EXAMINATION: General:  Middle aged man  in NAD Neuro:  Obtunded, non-verbal. Purposeful movements. HEENT:  Dry mucus membranes Cardiovascular:  Heart sounds dual and normal. Lungs:  CTAB Abdomen:  Soft Musculoskeletal:  No joint swelling. Skin:  No rashes  LABS:  BMET  Recent Labs Lab 03/23/16 1136 03/23/16 1940 03/24/16 0149  NA 141 142 144  K 3.6 3.4* 3.5  CL 103 103 107  CO2 27 27 28   BUN 33* 33* 35*  CREATININE 1.41* 1.43* 1.40*  GLUCOSE 128* 134*  147*    Electrolytes  Recent Labs Lab 03/23/16 1136 03/23/16 1940 03/24/16 0149 03/24/16 0207  CALCIUM >15.0* >15.0* 14.1*  --   PHOS  --   --   --  3.7    CBC  Recent Labs Lab 04/05/2016 0809 03/23/16 0705 03/24/16 0207  WBC 24.9* 23.9* 18.4*  HGB 15.3 13.5 12.1*  HCT 45.9 41.9 38.9*  PLT 232 222 172    Coag's  Recent Labs Lab 03/21/2016 1204  APTT 38*  INR 1.17    Sepsis Markers  Recent Labs Lab 04/04/2016 1204  03/27/2016 2142 03/23/16 0039 03/23/16 0705  LATICACIDVEN  --   < > 2.9* 2.9* 2.9*  PROCALCITON 0.37  --   --   --   --   < > = values in this interval not displayed.  ABG  Recent Labs Lab 03/24/16 0357  PHART 7.397  PCO2ART 46.6*  PO2ART 104.0*    Liver Enzymes  Recent Labs Lab 03/19/16 0947 03/27/2016 0809 03/23/16 1136  AST 16 27 23   ALT 11 19 20   ALKPHOS 131* 113 89  BILITOT 0.3 0.8 0.7  ALBUMIN 3.8 3.2* 2.7*    Cardiac Enzymes  Recent Labs Lab 03/23/16 1453 03/23/16 1940 03/24/16 0207  TROPONINI 0.06* 0.06* 0.06*    Glucose  Recent Labs Lab 03/24/16 0058  GLUCAP 136*    Imaging Dg Chest Port 1 View  03/24/2016  CLINICAL DATA:  Endotracheal tube placement.  Initial encounter. EXAM: PORTABLE CHEST 1 VIEW COMPARISON:  Chest radiograph performed 04/08/2016 FINDINGS: The patient's endotracheal tube is seen ending 12 cm above the carina. This was subsequently advanced. The enteric tube is noted extending below the diaphragm, ending about the body of the stomach. The right IJ line is noted ending about the distal SVC. Right midlung airspace opacity resolves on the subsequent study and likely reflects atelectasis. No pleural effusion or pneumothorax is seen. The cardiomediastinal silhouette is normal in size. No acute osseous abnormalities are identified. IMPRESSION: 1. Endotracheal tube seen ending 12 cm above the chronic. This was subsequently advanced. 2. Right midlung airspace opacity resolves on the subsequent study and  likely reflects atelectasis. Electronically Signed   By: Garald Balding M.D.   On: 03/24/2016 03:21   Dg Chest Port 1 View  03/24/2016  CLINICAL DATA:  Endotracheal tube placement.  Initial encounter. EXAM: PORTABLE CHEST 1 VIEW COMPARISON:  Chest radiograph performed earlier today at 2:56 a.m. FINDINGS: The patient's endotracheal tube is seen ending 6 cm above the carina. An enteric tube is noted extending below the diaphragm. A right IJ line is seen ending about the mid SVC. The lungs are well-aerated. Mild vascular congestion is noted. There is no evidence of focal opacification, pleural effusion or pneumothorax. The cardiomediastinal silhouette is within normal limits. No acute osseous abnormalities are seen. IMPRESSION: 1. Endotracheal tube seen ending 6 cm above the carina. 2. Mild vascular congestion noted.  Lungs remain grossly clear. Electronically Signed   By: Francoise Schaumann.D.  On: 03/24/2016 03:18     LINES/TUBES: >> Airway 7.5 mm, 03/24/16 >> HD Catheter (trialysis) 03/24/16 >> Foley 03/24/16 >> OGT 03/24/16  DISCUSSION: [name] is a 60 y/o man with mediastinal adenopathy, hypercalcemia and AMS  ASSESSMENT / PLAN:  PULMONARY A: Need for mechanical ventilation P:   Maintain on PRVC Plan for extubation once MS improves and Ca under control.   CARDIOVASCULAR A:  HTN CHF P:  Will monitor, less of an issue while on propofol. Diastolic HF  RENAL A:   Hypercalcemia Acute renal failure P:   Favor malignancy as likely source. HD line placed, appreciate renal recs  GASTROINTESTINAL A:   No active issues P:   Consult dietary for TFs  HEMATOLOGIC A:   Mediastinal adenopathy P:  Plan for EBUS early next week, once adenopathy improves.  INFECTIOUS A:   Possible sepsis P:   May also be due to lymphoma or other inflammatory process. Continue current ABX.  ENDOCRINE A:   S/p calcitonin for control of hypercalcemia HLD P:     NEUROLOGIC A:   Metabolic  encephalopathy due to hypercalecmia  Need for sedation P:   RASS goal: 0 Propofol Dialysis for management of hyperCa   FAMILY  - Updates:   - Inter-disciplinary family meet or Palliative Care meeting due by: 6/17  CRITICAL CARE Performed by: Luz Brazen   Total critical care time: 80 minutes  Critical care time was exclusive of separately billable procedures and treating other patients.  Critical care was necessary to treat or prevent imminent or life-threatening deterioration.  Critical care was time spent personally by me on the following activities: development of treatment plan with patient and/or surrogate as well as nursing, discussions with consultants, evaluation of patient's response to treatment, examination of patient, obtaining history from patient or surrogate, ordering and performing treatments and interventions, ordering and review of laboratory studies, ordering and review of radiographic studies, pulse oximetry and re-evaluation of patient's condition.   Pulmonary and [place] Pager: [phone]  03/24/2016, 6:09 AM

## 2016-03-24 NOTE — Progress Notes (Signed)
03/24/16 0510  Pt has ordered MRI. On hold per stepdown RN report and [facility] orders. CCM MD aware. RT unable to take on this shift due to staffing. MRI and other orders to be addressed on AM rounding today.   Wyn Quaker RN

## 2016-03-24 NOTE — Progress Notes (Signed)
PULMONARY / CRITICAL CARE MEDICINE   Name: [name] MRN: [id number] DOB: Apr 23, 1956    ADMISSION DATE:  03/16/2016 CONSULTATION DATE:  03/23/16  CHIEF COMPLAINT:  Altered mental status  HISTORY OF PRESENT ILLNESS:   60yo male smoker (40 pack years) with hx COPD, CKD 3, HTN, dCHF, anxiety, GERD recently treated for ?PNA, presented 6/8 with SIRS and progressive AMS.  Found to have mediastinal adenopathy and significant refractory hypercalcemia.  Initially admitted by [facility] to SDU but had progression of AMS requiring intubation 6/9 and tx ICU.     SUBJECTIVE:  Febrile (101.4), Ca slightly improved.  UOP improved.   VITAL SIGNS: BP 118/65 mmHg  Pulse 76  Temp(Src) 99.1 F (37.3 C) (Oral)  Resp 16  Ht 5\' 8"  (1.727 m)  Wt 82.1 kg (181 lb)  BMI 27.53 kg/m2  SpO2 95%  HEMODYNAMICS:    VENTILATOR SETTINGS: Vent Mode:  [-] PRVC FiO2 (%):  [40 %-60 %] 50 % Set Rate:  [16 bmp] 16 bmp Vt Set:  [550 mL] 550 mL PEEP:  [5 cmH20] 5 cmH20 Plateau Pressure:  [12 cmH20-14 cmH20] 12 cmH20  INTAKE / OUTPUT: I/O last 3 completed shifts: In: 8682.4 [I.V.:8082.4; IV [id number] Out: [id number]; Emesis/NG output:50]  PHYSICAL EXAMINATION: General:  Middle aged man in NAD Neuro:  Obtunded, non-verbal. Purposeful movements. HEENT:  Dry mucus membranes Cardiovascular:  Heart sounds dual and normal. Lungs:  CTAB Abdomen:  Soft Musculoskeletal:  No joint swelling. Skin:  No rashes  LABS:  BMET  Recent Labs Lab 03/23/16 1940 03/24/16 0149 03/24/16 1058  NA 142 144 144  K 3.4* 3.5 3.8  CL 103 107 111  CO2 27 28 25   BUN 33* 35* 42*  CREATININE 1.43* 1.40* 1.51*  GLUCOSE 134* 147* 129*    Electrolytes  Recent Labs Lab 03/23/16 1940 03/24/16 0149 03/24/16 0207 03/24/16 1058  CALCIUM >15.0* 14.1*  --  13.8*  PHOS  --   --  3.7  --     CBC  Recent Labs Lab 04/13/2016 0809 03/23/16 0705 03/24/16 0207  WBC 24.9* 23.9* 18.4*  HGB 15.3 13.5 12.1*  HCT 45.9  41.9 38.9*  PLT 232 222 172    Coag's  Recent Labs Lab 03/24/2016 1204  APTT 38*  INR 1.17    Sepsis Markers  Recent Labs Lab 03/20/2016 1204  03/17/2016 2142 03/23/16 0039 03/23/16 0705  LATICACIDVEN  --   < > 2.9* 2.9* 2.9*  PROCALCITON 0.37  --   --   --   --   < > = values in this interval not displayed.  ABG  Recent Labs Lab 03/24/16 0357  PHART 7.397  PCO2ART 46.6*  PO2ART 104.0*    Liver Enzymes  Recent Labs Lab 03/19/16 0947 04/05/2016 0809 03/23/16 1136  AST 16 27 23   ALT 11 19 20   ALKPHOS 131* 113 89  BILITOT 0.3 0.8 0.7  ALBUMIN 3.8 3.2* 2.7*    Cardiac Enzymes  Recent Labs Lab 03/23/16 1453 03/23/16 1940 03/24/16 0207  TROPONINI 0.06* 0.06* 0.06*    Glucose  Recent Labs Lab 03/24/16 0058  GLUCAP 136*    Imaging US Abdomen Complete  03/24/2016  CLINICAL DATA:  60 year old male with a history of cholelithiasis. EXAM: ABDOMEN ULTRASOUND COMPLETE COMPARISON:  CT 03/28/2016 FINDINGS: Gallbladder: Hyperechoic material layer dependently within the gallbladder. No gallbladder wall thickening or pericholecystic fluid. Negative sonographic Murphy's sign. Gallbladder wall thickness measures 2 mm -3 mm. Common bile duct: Diameter: 6 mm Liver: Hypoechoic  lesion without significant through transmission involving the left lobe of the liver measures 1.0 cm x 0.8 cm x 1.1 cm. IVC: No abnormality visualized. Pancreas: Visualized portion unremarkable. Spleen: Size and appearance within normal limits. Right Kidney: Length: 11.0. Echogenicity within normal limits. No mass or hydronephrosis visualized. Left Kidney: Length: 10.8. Echogenicity within normal limits. No mass or hydronephrosis visualized. Abdominal aorta: No aneurysm visualized. Other findings: None. IMPRESSION: Cholelithiasis without sonographic evidence of acute cholecystitis. Left liver lobe lesion measures 1.1 cm in greatest diameter. Lesion may represent a benign cyst, although there is not  significant through transmission. Further evaluation with either contrast-enhanced abdominal CT or contrast-enhanced MR recommended, particularly if the patient has a history of malignancy. Signed, Dulcy Fanny. Earleen Newport, DO [facility] Radiology Electronically Signed   By: Corrie Mckusick D.O.   On: 03/24/2016 09:53   Dg Chest Port 1 View  03/24/2016  CLINICAL DATA:  Endotracheal tube placement.  Initial encounter. EXAM: PORTABLE CHEST 1 VIEW COMPARISON:  Chest radiograph performed 04/03/2016 FINDINGS: The patient's endotracheal tube is seen ending 12 cm above the carina. This was subsequently advanced. The enteric tube is noted extending below the diaphragm, ending about the body of the stomach. The right IJ line is noted ending about the distal SVC. Right midlung airspace opacity resolves on the subsequent study and likely reflects atelectasis. No pleural effusion or pneumothorax is seen. The cardiomediastinal silhouette is normal in size. No acute osseous abnormalities are identified. IMPRESSION: 1. Endotracheal tube seen ending 12 cm above the chronic. This was subsequently advanced. 2. Right midlung airspace opacity resolves on the subsequent study and likely reflects atelectasis. Electronically Signed   By: Garald Balding M.D.   On: 03/24/2016 03:21   Dg Chest Port 1 View  03/24/2016  CLINICAL DATA:  Endotracheal tube placement.  Initial encounter. EXAM: PORTABLE CHEST 1 VIEW COMPARISON:  Chest radiograph performed earlier today at 2:56 a.m. FINDINGS: The patient's endotracheal tube is seen ending 6 cm above the carina. An enteric tube is noted extending below the diaphragm. A right IJ line is seen ending about the mid SVC. The lungs are well-aerated. Mild vascular congestion is noted. There is no evidence of focal opacification, pleural effusion or pneumothorax. The cardiomediastinal silhouette is within normal limits. No acute osseous abnormalities are seen.  IMPRESSION: 1. Endotracheal tube seen ending 6 cm above the carina. 2. Mild vascular congestion noted.  Lungs remain grossly clear. Electronically Signed   By: Garald Balding M.D.   On: 03/24/2016 03:18   ABX:  Vanc 6/9>>> Zosyn 6/9>>>  MICRO:  BC x 2 6/9>>> Urine 6/9>>>  LINES/TUBES: ETT 6/10>>> R IJ HD cath 6/10>>>    DISCUSSION: Mr. Pitre is a 60 y/o man with mediastinal adenopathy, hypercalcemia and AMS  ASSESSMENT / PLAN:  PULMONARY A: Acute respiratory failure -- r/t AMS  Suspect malignancy  P:   Maintain on PRVC For bronch with bx next week (CVTS)  F/u CXR  Plan for extubation once MS improves and Ca under control.  Empiric abx as above   CARDIOVASCULAR A:  HTN CHF P:  Hold home amlodipine for now  Monitor   RENAL A:   Severe Hypercalcemia Acute renal failure CKD 3 P:   Favor malignancy as likely source. Renal following - hopeful he will not need HD  Continue calcitonin, bisphosphonate, hydration q6 bmet    GASTROINTESTINAL A:   No active issues P:   TF per nutrition   HEMATOLOGIC A:  Mediastinal adenopathy P:  Plan for EBUS early next week, once adenopathy improves - CVTS planning this   INFECTIOUS A:   SIRS  Fever  P:   May also be due to lymphoma or other inflammatory process Cont abx for ?PNA F/u cultures   ENDOCRINE A:   S/p calcitonin for control of hypercalcemia P:   Monitor glucose on chem   NEUROLOGIC A:   Metabolic encephalopathy due to hypercalecmia  Need for sedation P:   RASS goal: 0 Propofol - wean as able  Management of hyperCa   FAMILY  - Updates: sister updated at bedside 34/10  - Inter-disciplinary family meet or Palliative Care meeting due by: 6/17  Nickolas Madrid, NP 03/24/2016  12:02 PM Pager: [phone] or [phone]

## 2016-03-24 NOTE — Progress Notes (Signed)
Patient transferred to MICU for higher level of care and dialysis (urgent).  Discussed with sister in the room who has contacted the wife.

## 2016-03-24 NOTE — Progress Notes (Signed)
[name]   DOB:Jul 08, 1956   [id number]   [id number]  Hem/Onc follow up   Subjective: I am covering my partner Dr. Learta Codding this weekend. Pt was intubated and moved to the ICU last night due to altered mental status. Renal was consulted. His calcium came down to 14.1 this morning. He spiked a fever 101.4 last night, antibiotics started. I saw patient and spoke with his sister and wife at bedside.   Objective:  Filed Vitals:   03/24/16 0500 03/24/16 0600  BP: 100/65 107/69  Pulse: 78 78  Temp:    Resp:      Body mass index is 27.53 kg/(m^2).  Intake/Output Summary (Last 24 hours) at 03/24/16 [id number] Last data filed at 03/24/16 0700  Gross per 24 hour  Intake 6332.44 ml  Output   2675 ml  Net 3657.44 ml    Patient is intubated, on the vent, sedated.  Sclerae unicteric  No peripheral adenopathy  Lungs clear -- no rales or rhonchi  Heart regular rate and rhythm  Abdomen benign  Neuro sedated    CBG (last 3)   Recent Labs  03/24/16 0058  GLUCAP 136*     Labs:  Lab Results  Component Value Date   WBC 18.4* 03/24/2016   HGB 12.1* 03/24/2016   HCT 38.9* 03/24/2016   MCV 94.4 03/24/2016   PLT 172 03/24/2016   NEUTROABS 18.4* 03/15/2016    @LASTCHEMISTRY @  Urine Studies No results for input(s): UHGB, CRYS in the last 72 hours.  Invalid input(s): UACOL, UAPR, USPG, UPH, UTP, UGL, [place], UBIL, UNIT, UROB, [place], UEPI, UWBC, [place], [place]  Basic Metabolic Panel:  Recent Labs Lab 04/05/2016 1840 03/23/16 0705 03/23/16 1136 03/23/16 1940 03/24/16 0149 03/24/16 0207  NA 136 140 141 142 144  --   K 4.6 3.8 3.6 3.4* 3.5  --   CL 101 101 103 103 107  --   CO2 25 27 27 27 28   --   GLUCOSE 131* 116* 128* 134* 147*  --   BUN 37* 36* 33* 33* 35*  --   CREATININE 1.52* 1.54* 1.41* 1.43* 1.40*  --   CALCIUM >15.0* >15.0* >15.0* >15.0* 14.1*  --   PHOS  --   --   --   --   --  3.7   GFR Estimated Creatinine Clearance: 59.4 mL/min (by C-G formula  based on Cr of 1.4). Liver Function Tests:  Recent Labs Lab 03/19/16 0947 04/10/2016 0809 03/23/16 1136  AST 16 27 23   ALT 11 19 20   ALKPHOS 131* 113 89  BILITOT 0.3 0.8 0.7  PROT 7.9 7.6 7.0  ALBUMIN 3.8 3.2* 2.7*   No results for input(s): LIPASE, AMYLASE in the last 168 hours.  Recent Labs Lab 04/05/2016 0845  AMMONIA 26   Coagulation profile  Recent Labs Lab 04/13/2016 1204  INR 1.17    CBC:  Recent Labs Lab 03/19/16 0947 04/10/2016 0809 03/23/16 0705 03/24/16 0207  WBC 23.0 Repeated and verified X2.* 24.9* 23.9* 18.4*  NEUTROABS 15.6* 18.4*  --   --   HGB 14.0 15.3 13.5 12.1*  HCT 41.7 45.9 41.9 38.9*  MCV 90.6 91.4 92.1 94.4  PLT 296.0 232 222 172   Cardiac Enzymes:  Recent Labs Lab 03/23/16 1453 03/23/16 1940 03/24/16 0207  TROPONINI 0.06* 0.06* 0.06*   BNP: Invalid input(s): POCBNP CBG:  Recent Labs Lab 03/24/16 0058  GLUCAP 136*   D-Dimer No results for input(s): DDIMER in the last 72 hours.  Hgb A1c No results for input(s): HGBA1C in the last 72 hours. Lipid Profile No results for input(s): CHOL, HDL, LDLCALC, TRIG, CHOLHDL, LDLDIRECT in the last 72 hours. Thyroid function studies No results for input(s): TSH, T4TOTAL, T3FREE, THYROIDAB in the last 72 hours.  Invalid input(s): FREET3 Anemia work up No results for input(s): VITAMINB12, FOLATE, FERRITIN, TIBC, IRON, RETICCTPCT in the last 72 hours. Microbiology Recent Results (from the past 240 hour(s))  Blood Culture (routine x 2)     Status: None (Preliminary result)   Collection Time: 03/18/2016  8:05 AM  Result Value Ref Range Status   Specimen Description BLOOD LEFT HAND  Final   Special Requests BOTTLES DRAWN AEROBIC AND ANAEROBIC 5CC  Final   Culture NO GROWTH 1 DAY  Final   Report Status PENDING  Incomplete  Blood Culture (routine x 2)     Status: None (Preliminary result)   Collection Time: 04/03/2016  8:06 AM  Result Value Ref Range Status   Specimen Description BLOOD RIGHT  ANTECUBITAL  Final   Special Requests BOTTLES DRAWN AEROBIC AND ANAEROBIC 10CC  Final   Culture NO GROWTH 1 DAY  Final   Report Status PENDING  Incomplete  Urine culture     Status: None   Collection Time: 03/25/2016  9:22 AM  Result Value Ref Range Status   Specimen Description URINE, CATHETERIZED  Final   Special Requests NONE  Final   Culture NO GROWTH  Final   Report Status 03/23/2016 FINAL  Final  MRSA PCR Screening     Status: None   Collection Time: 04/02/2016  7:33 PM  Result Value Ref Range Status   MRSA by PCR NEGATIVE NEGATIVE Final    Comment:        The GeneXpert MRSA Assay (FDA approved for NASAL specimens only), is one component of a comprehensive MRSA colonization surveillance program. It is not intended to diagnose MRSA infection nor to guide or monitor treatment for MRSA infections.       Studies:  Ct Abdomen Pelvis Wo Contrast  03/27/2016  CLINICAL DATA:  Altered mental status.  Possible sepsis. EXAM: CT ABDOMEN AND PELVIS WITHOUT CONTRAST TECHNIQUE: Multidetector CT imaging of the abdomen and pelvis was performed following the standard protocol without IV contrast. COMPARISON:  None. FINDINGS: Mild dependent atelectasis. Lung bases are otherwise within normal limits No free air or free fluid. No stones are seen in the right kidney. There is a 5 mm stone in the left kidney on coronal image 63. No other renal stones. No hydronephrosis. Mild bilateral perinephric stranding is identified. This may be chronic as no acute cause is seen. No ureterectasis or ureteral stones. The liver is normal. The gallbladder is distended with high attenuation layering posteriorly. The gallbladder wall and pericholecystic region are not well assessed due to motion. The spleen, adrenal glands, and pancreas are normal. The abdominal aorta is atherosclerotic and tortuous but non aneurysmal. No adenopathy is identified. There is a small hiatal hernia. The stomach and small bowel are normal. The  colon there is fecal loading in the proximal colon. The colon and appendix are otherwise normal. No adenopathy identified. The pelvis demonstrates no abnormalities in the prostate, seminal vesicles, or bladder. No suspicious masses or adenopathy. No acute bony abnormalities. Degenerative changes are seen in the spine. IMPRESSION: 1. Distended gallbladder with layering high attenuation dependently suggests a gallbladder with sludge or stones. An ultrasound could better evaluate. 2. 5 mm nonobstructive stone in the left kidney. Electronically Signed  By: Dorise Bullion III M.D   On: 04/05/2016 13:15   Ct Head Wo Contrast  04/05/2016  CLINICAL DATA:  Altered mental status EXAM: CT HEAD WITHOUT CONTRAST TECHNIQUE: Contiguous axial images were obtained from the base of the skull through the vertex without intravenous contrast. COMPARISON:  Brain MRI October 19, 2013 FINDINGS: The ventricles are normal in size and configuration. There is no intracranial mass, hemorrhage, extra-axial fluid collection, or midline shift. There is mild small vessel disease in the centra semiovale bilaterally. There is evidence of a small age uncertain lacunar type infarct in the anterior left lentiform nucleus. Gray-white compartments elsewhere are normal. Bony calvarium appears intact. The mastoid air cells are clear. No intraorbital lesions are identified. There is opacification of multiple ethmoid air cells bilaterally. There is opacification in the visualized right maxillary antrum as well as mucosal thickening noted in the left maxillary antrum. IMPRESSION: Small age uncertain lacunar type infarct in the anterior left lentiform nucleus, best seen on axial slice 37 series 6. Elsewhere there is slight periventricular small vessel disease. No hemorrhage or mass effect. There is multifocal paranasal sinus disease. Electronically Signed   By: Lowella Grip III M.D.   On: 04/08/2016 11:54   Dg Chest Port 1 View  03/24/2016  CLINICAL  DATA:  Endotracheal tube placement.  Initial encounter. EXAM: PORTABLE CHEST 1 VIEW COMPARISON:  Chest radiograph performed 03/23/2016 FINDINGS: The patient's endotracheal tube is seen ending 12 cm above the carina. This was subsequently advanced. The enteric tube is noted extending below the diaphragm, ending about the body of the stomach. The right IJ line is noted ending about the distal SVC. Right midlung airspace opacity resolves on the subsequent study and likely reflects atelectasis. No pleural effusion or pneumothorax is seen. The cardiomediastinal silhouette is normal in size. No acute osseous abnormalities are identified. IMPRESSION: 1. Endotracheal tube seen ending 12 cm above the chronic. This was subsequently advanced. 2. Right midlung airspace opacity resolves on the subsequent study and likely reflects atelectasis. Electronically Signed   By: Garald Balding M.D.   On: 03/24/2016 03:21   Dg Chest Port 1 View  03/24/2016  CLINICAL DATA:  Endotracheal tube placement.  Initial encounter. EXAM: PORTABLE CHEST 1 VIEW COMPARISON:  Chest radiograph performed earlier today at 2:56 a.m. FINDINGS: The patient's endotracheal tube is seen ending 6 cm above the carina. An enteric tube is noted extending below the diaphragm. A right IJ line is seen ending about the mid SVC. The lungs are well-aerated. Mild vascular congestion is noted. There is no evidence of focal opacification, pleural effusion or pneumothorax. The cardiomediastinal silhouette is within normal limits. No acute osseous abnormalities are seen. IMPRESSION: 1. Endotracheal tube seen ending 6 cm above the carina. 2. Mild vascular congestion noted.  Lungs remain grossly clear. Electronically Signed   By: Garald Balding M.D.   On: 03/24/2016 03:18   Dg Chest Port 1 View  04/13/2016  CLINICAL DATA:  Altered mental status EXAM: PORTABLE CHEST 1 VIEW COMPARISON:  03/19/2016; 03/16/2016; chest CT - 03/16/2016 FINDINGS: Grossly unchanged borderline  enlarged cardiac silhouette and mediastinal contours with slight differences likely attributable to decreased lung volumes and AP projection. The pulmonary vasculature is indistinct with cephalization of flow. Minimal perihilar and medial basilar opacities favored to represent atelectasis. No pleural effusion or pneumothorax. No acute osseous abnormalities. IMPRESSION: Go suspected mild pulmonary edema on this hypoventilated AP portable examination. Electronically Signed   By: Sandi Mariscal M.D.   On: 03/19/2016  08:25    Assessment: 60 y.o.   1. Hypercalcemia 2. Mental status secondary to #1 3. Chest pain-likely secondary to a rib fracture and potentially bone metastases 4. COPD 5. Mediastinal lymphadenopathy 6. Leukocytosis 7. Fever  Plan: -His calcium has come down, I agree with IV fluids and continue calcitonin, I'll hold on Xgeva for now. Zometa usually will kick in in 2-4 days, and I anticipate his calcium will continue trending down. -If he has signs of volume overload, consider Lasix, which will help his hypercalcemia also. -I suspect he has lymphoma or sarcoidosis. The key is to get lymph node biopsy. He does not have any peripheral lymph nodes, would need bronchoscopy and a EBUS or mediastinoscopy lymph node biopsy. I discussed with patient's sister, and critical care attending Dr. Curt Jews. -Steroids will probably help to control his hypercalcemia and disease if his lymphoma or sarcoidosis, however it may slightly interfere the pathology diagnosis, patient received 1 dose of dexamethasone 10 mg last night, I'll hold on for now, and may repeat a dose tomorrow morning if his hypercalcemia gets worse. -Hopefully the medistinal lymph node biopsy will be done on Monday, so he can put him back to steroids before we have the definitive diagnosis and treatment plan.  I'll follow-up tomorrow.  Truitt Merle, MD 03/24/2016  8:22 AM

## 2016-03-24 NOTE — Progress Notes (Signed)
CT surgery  Events of patient's intubation and transferred to ICU noted Serum calcium levels are responding to IV calcitonin We'll potentially plan bronchoscopy and transbronchial  ultrasound-guided mediastinal lymph node biopsies Tuesday, June 13

## 2016-03-24 NOTE — Progress Notes (Signed)
03/24/16 0200   Sister at bedside. Updated. Code word set up: Geoffery Spruce.   Wyn Quaker RN

## 2016-03-24 NOTE — Progress Notes (Signed)
NUTRITION NOTE  Consult for TF initiation and management received. Will order TF as outlined in recommendations from full assessment note today at 1105:   Vital 1.5 @ 50 mL/hr with 30 mL Prostat BID. Begin TF at @ 30 mL/hr and advance by 10 mL every 4 hours to reach goal rate of 50 mL/hr. At goal rate, this regimen provide 2000 kcal, 111 grams of protein, and 917 mL free water. (Will provide 1200 mg Ca). Free water per NP/MD given high volume IVF and nephrology involvement. Will place PEPUP protocol.    RD will continue to follow per protocol.    Jarome Matin, RD, LDN Inpatient Clinical Dietitian Pager # [phone] After hours/weekend pager # [phone]

## 2016-03-24 NOTE — Procedures (Signed)
Central Venous Catheter Insertion Procedure Note [name] [id number] 1956-06-27  Procedure: Insertion of Central Venous Catheter Indications: Dialysis, need for access  Procedure Details Consent: Risks of procedure as well as the alternatives and risks of each were explained to the (patient/caregiver).  Consent for procedure obtained. Time Out: Verified patient identification, verified procedure, site/side was marked, verified correct patient position, special equipment/implants available, medications/allergies/relevent history reviewed, required imaging and test results available.  Performed  Maximum sterile technique was used including antiseptics, cap, gloves, gown, hand hygiene, mask and sheet. Skin prep: Chlorhexidine; local anesthetic administered A antimicrobial bonded/coated triple lumen catheter was placed in the right internal jugular vein using the Seldinger technique.  Evaluation Blood flow good Complications: No apparent complications Patient did tolerate procedure well. Chest X-ray ordered to verify placement.  CXR: normal.  [name] 03/24/2016, 6:06 AM

## 2016-03-24 NOTE — Progress Notes (Signed)
03/24/16 0300  Patient has poor access. Multiple IV attempts. CCM to place HD cath for better access and for potential HD if deemed necessary in the AM.   Wyn Quaker RN

## 2016-03-24 NOTE — Progress Notes (Signed)
Discussed with on-call MD and oncology concerns and critical values of Calcium and high blood pressures 200's/120's HR 120's.  (See MAR)  On-call coverage came to floor to assess.

## 2016-03-25 LAB — GLUCOSE, CAPILLARY
GLUCOSE-CAPILLARY: 110 mg/dL — AB (ref 65–99)
GLUCOSE-CAPILLARY: 113 mg/dL — AB (ref 65–99)
GLUCOSE-CAPILLARY: 102 mg/dL — AB (ref 65–99)
GLUCOSE-CAPILLARY: 115 mg/dL — AB (ref 65–99)
Glucose-Capillary: 112 mg/dL — ABNORMAL HIGH (ref 65–99)
Glucose-Capillary: 124 mg/dL — ABNORMAL HIGH (ref 65–99)

## 2016-03-25 LAB — PHOSPHORUS
PHOSPHORUS: 2.1 mg/dL — AB (ref 2.5–4.6)
Phosphorus: 1.6 mg/dL — ABNORMAL LOW (ref 2.5–4.6)

## 2016-03-25 LAB — CBC
HEMATOCRIT: 36.5 % — AB (ref 39.0–52.0)
Hemoglobin: 11.3 g/dL — ABNORMAL LOW (ref 13.0–17.0)
MCH: 29.7 pg (ref 26.0–34.0)
MCHC: 31 g/dL (ref 30.0–36.0)
MCV: 95.8 fL (ref 78.0–100.0)
PLATELETS: 145 10*3/uL — AB (ref 150–400)
RBC: 3.81 MIL/uL — ABNORMAL LOW (ref 4.22–5.81)
RDW: 14.3 % (ref 11.5–15.5)
WBC: 21.3 10*3/uL — AB (ref 4.0–10.5)

## 2016-03-25 LAB — BASIC METABOLIC PANEL
Anion gap: 7 (ref 5–15)
Anion gap: 9 (ref 5–15)
Anion gap: 12 (ref 5–15)
Anion gap: 7 (ref 5–15)
BUN: 41 mg/dL — AB (ref 6–20)
BUN: 39 mg/dL — AB (ref 6–20)
BUN: 34 mg/dL — AB (ref 6–20)
BUN: 33 mg/dL — AB (ref 6–20)
CALCIUM: 12.5 mg/dL — AB (ref 8.9–10.3)
CALCIUM: 12.1 mg/dL — AB (ref 8.9–10.3)
CO2: 25 mmol/L (ref 22–32)
CO2: 24 mmol/L (ref 22–32)
CO2: 21 mmol/L — ABNORMAL LOW (ref 22–32)
CO2: 21 mmol/L — ABNORMAL LOW (ref 22–32)
CREATININE: 1.44 mg/dL — AB (ref 0.61–1.24)
CREATININE: 1.38 mg/dL — AB (ref 0.61–1.24)
CREATININE: 1.28 mg/dL — AB (ref 0.61–1.24)
Calcium: 10.8 mg/dL — ABNORMAL HIGH (ref 8.9–10.3)
Calcium: 10.9 mg/dL — ABNORMAL HIGH (ref 8.9–10.3)
Chloride: 116 mmol/L — ABNORMAL HIGH (ref 101–111)
Chloride: 116 mmol/L — ABNORMAL HIGH (ref 101–111)
Chloride: 115 mmol/L — ABNORMAL HIGH (ref 101–111)
Chloride: 119 mmol/L — ABNORMAL HIGH (ref 101–111)
Creatinine, Ser: 1.59 mg/dL — ABNORMAL HIGH (ref 0.61–1.24)
GFR calc Af Amer: 53 mL/min — ABNORMAL LOW (ref >60)
GFR calc Af Amer: 60 mL/min — ABNORMAL LOW (ref >60)
GFR calc Af Amer: >60 mL/min (ref >60)
GFR calc Af Amer: >60 mL/min (ref >60)
GFR, EST NON AFRICAN AMERICAN: 46 mL/min — AB (ref >60)
GFR, EST NON AFRICAN AMERICAN: 52 mL/min — AB (ref >60)
GFR, EST NON AFRICAN AMERICAN: 54 mL/min — AB (ref >60)
GFR, EST NON AFRICAN AMERICAN: 60 mL/min — AB (ref >60)
GLUCOSE: 120 mg/dL — AB (ref 65–99)
GLUCOSE: 114 mg/dL — AB (ref 65–99)
GLUCOSE: 115 mg/dL — AB (ref 65–99)
GLUCOSE: 111 mg/dL — AB (ref 65–99)
POTASSIUM: 3.4 mmol/L — AB (ref 3.5–5.1)
POTASSIUM: 2.9 mmol/L — AB (ref 3.5–5.1)
POTASSIUM: 3 mmol/L — AB (ref 3.5–5.1)
Potassium: 3.8 mmol/L (ref 3.5–5.1)
SODIUM: 148 mmol/L — AB (ref 135–145)
SODIUM: 149 mmol/L — AB (ref 135–145)
SODIUM: 148 mmol/L — AB (ref 135–145)
SODIUM: 147 mmol/L — AB (ref 135–145)

## 2016-03-25 LAB — MAGNESIUM
MAGNESIUM: 1.3 mg/dL — AB (ref 1.7–2.4)
MAGNESIUM: 2.5 mg/dL — AB (ref 1.7–2.4)
MAGNESIUM: 2.2 mg/dL (ref 1.7–2.4)

## 2016-03-25 LAB — CALCIUM, IONIZED: CALCIUM, IONIZED, SERUM: 9.5 mg/dL — AB (ref 4.5–5.6)

## 2016-03-25 MED ORDER — SODIUM CHLORIDE 0.9 % IV SOLN
6.0000 g | Freq: Once | INTRAVENOUS | Status: AC
  Administered 2016-03-25: 6 g via INTRAVENOUS
  Filled 2016-03-25: qty 12

## 2016-03-25 MED ORDER — POTASSIUM CHLORIDE 10 MEQ/50ML IV SOLN: 10.0000 meq | INTRAVENOUS | Status: DC

## 2016-03-25 MED ORDER — POTASSIUM CHLORIDE 20 MEQ/15ML (10%) PO SOLN: 40.0000 meq | Freq: Once | ORAL | Status: DC

## 2016-03-25 MED ORDER — POTASSIUM PHOSPHATES 15 MMOLE/5ML IV SOLN
30.0000 mmol | Freq: Once | INTRAVENOUS | Status: AC
  Administered 2016-03-25: 30 mmol via INTRAVENOUS
  Filled 2016-03-25: qty 10

## 2016-03-25 MED ORDER — POTASSIUM CHLORIDE 10 MEQ/50ML IV SOLN
10.0000 meq | INTRAVENOUS | Status: AC
  Administered 2016-03-25 (×2): 10 meq via INTRAVENOUS
  Filled 2016-03-25 (×2): qty 50

## 2016-03-25 MED ORDER — POTASSIUM CHLORIDE 10 MEQ/50ML IV SOLN
10.0000 meq | INTRAVENOUS | Status: AC
  Administered 2016-03-25 (×4): 10 meq via INTRAVENOUS
  Filled 2016-03-25 (×3): qty 50

## 2016-03-25 MED ORDER — AMLODIPINE BESYLATE 10 MG PO TABS
10.0000 mg | ORAL_TABLET | Freq: Every day | ORAL | Status: DC
  Administered 2016-03-26 – 2016-03-28 (×3): 10 mg via ORAL
  Filled 2016-03-25 (×6): qty 1

## 2016-03-25 NOTE — Progress Notes (Addendum)
[name]   DOB:03-07-1956   [id number]   [id number]  Hem/Onc follow up   Subjective: pt is still on the vent, but more awake, calcium down to 12.1 this morning, intermittent fever.   Objective:  Filed Vitals:   03/25/16 1223 03/25/16 1300  BP:  183/91  Pulse:  106  Temp: 99.7 F (37.6 C)   Resp:  29    Body mass index is 28.83 kg/(m^2).  Intake/Output Summary (Last 24 hours) at 03/25/16 1345 Last data filed at 03/25/16 1300  Gross per 24 hour  Intake 8025.97 ml  Output   4055 ml  Net 3970.97 ml    Patient is intubated, on the vent, sedated.  Sclerae unicteric  No peripheral adenopathy  Lungs clear -- no rales or rhonchi  Heart regular rate and rhythm  Abdomen benign  Neuro sedated    CBG (last 3)   Recent Labs  03/25/16 0349 03/25/16 0826 03/25/16 1221  GLUCAP 110* 113* 102*     Labs:  Lab Results  Component Value Date   WBC 21.3* 03/25/2016   HGB 11.3* 03/25/2016   HCT 36.5* 03/25/2016   MCV 95.8 03/25/2016   PLT 145* 03/25/2016   NEUTROABS 18.4* 04/09/2016    @LASTCHEMISTRY @  Urine Studies No results for input(s): UHGB, CRYS in the last 72 hours.  Invalid input(s): UACOL, UAPR, USPG, UPH, UTP, UGL, UKET, UBIL, UNIT, UROB, ULEU, UEPI, UWBC, [place], [place], [place], [place], [place]  Basic Metabolic Panel:  Recent Labs Lab 03/24/16 0149 03/24/16 0207 03/24/16 1058 03/24/16 1219 03/24/16 1815 03/24/16 1910 03/25/16 0110 03/25/16 0440 03/25/16 0700  NA 144  --  144  --   --  145 148*  --  149*  K 3.5  --  3.8  --   --  3.5 3.4*  --  3.8  CL 107  --  111  --   --  113* 116*  --  116*  CO2 28  --  25  --   --  25 25  --  24  GLUCOSE 147*  --  129*  --   --  119* 120*  --  114*  BUN 35*  --  42*  --   --  43* 41*  --  39*  CREATININE 1.40*  --  1.51*  --   --  1.54* 1.59*  --  1.44*  CALCIUM 14.1*  --  13.8*  --   --  13.3* 12.5*  --  12.1*  MG  --   --   --  1.4* 1.3*  --   --  1.3*  --   PHOS  --  3.7  --  3.6 2.4*  --   --  2.1*  --     GFR Estimated Creatinine Clearance: 58.9 mL/min (by C-G formula based on Cr of 1.44). Liver Function Tests:  Recent Labs Lab 03/19/16 0947 04/03/2016 0809 03/23/16 1136  AST 16 27 23   ALT 11 19 20   ALKPHOS 131* 113 89  BILITOT 0.3 0.8 0.7  PROT 7.9 7.6 7.0  ALBUMIN 3.8 3.2* 2.7*   No results for input(s): LIPASE, AMYLASE in the last 168 hours.  Recent Labs Lab 04/13/2016 0845  AMMONIA 26   Coagulation profile  Recent Labs Lab 03/26/2016 1204  INR 1.17    CBC:  Recent Labs Lab 03/19/16 0947 03/21/2016 0809 03/23/16 0705 03/24/16 0207 03/25/16 0440  WBC 23.0 Repeated and verified X2.* 24.9* 23.9* 18.4* 21.3*  NEUTROABS 15.6* 18.4*  --   --   --  HGB 14.0 15.3 13.5 12.1* 11.3*  HCT 41.7 45.9 41.9 38.9* 36.5*  MCV 90.6 91.4 92.1 94.4 95.8  PLT 296.0 232 222 172 145*   Cardiac Enzymes:  Recent Labs Lab 03/23/16 1453 03/23/16 1940 03/24/16 0207  TROPONINI 0.06* 0.06* 0.06*   BNP: Invalid input(s): POCBNP CBG:  Recent Labs Lab 03/24/16 2024 03/25/16 0002 03/25/16 0349 03/25/16 0826 03/25/16 1221  GLUCAP 112* 124* 110* 113* 102*   D-Dimer No results for input(s): DDIMER in the last 72 hours. Hgb A1c No results for input(s): HGBA1C in the last 72 hours. Lipid Profile  Recent Labs  03/24/16 0930  TRIG 339*   Thyroid function studies No results for input(s): TSH, T4TOTAL, T3FREE, THYROIDAB in the last 72 hours.  Invalid input(s): FREET3 Anemia work up No results for input(s): VITAMINB12, FOLATE, FERRITIN, TIBC, IRON, RETICCTPCT in the last 72 hours. Microbiology Recent Results (from the past 240 hour(s))  Blood Culture (routine x 2)     Status: None (Preliminary result)   Collection Time: 03/29/2016  8:05 AM  Result Value Ref Range Status   Specimen Description BLOOD LEFT HAND  Final   Special Requests BOTTLES DRAWN AEROBIC AND ANAEROBIC 5CC  Final   Culture NO GROWTH 3 DAYS  Final   Report Status PENDING  Incomplete  Blood Culture  (routine x 2)     Status: None (Preliminary result)   Collection Time: 03/24/2016  8:06 AM  Result Value Ref Range Status   Specimen Description BLOOD RIGHT ANTECUBITAL  Final   Special Requests BOTTLES DRAWN AEROBIC AND ANAEROBIC 10CC  Final   Culture NO GROWTH 3 DAYS  Final   Report Status PENDING  Incomplete  Urine culture     Status: None   Collection Time: 03/30/2016  9:22 AM  Result Value Ref Range Status   Specimen Description URINE, CATHETERIZED  Final   Special Requests NONE  Final   Culture NO GROWTH  Final   Report Status 03/23/2016 FINAL  Final  MRSA PCR Screening     Status: None   Collection Time: 03/30/2016  7:33 PM  Result Value Ref Range Status   MRSA by PCR NEGATIVE NEGATIVE Final    Comment:        The GeneXpert MRSA Assay (FDA approved for NASAL specimens only), is one component of a comprehensive MRSA colonization surveillance program. It is not intended to diagnose MRSA infection nor to guide or monitor treatment for MRSA infections.       Studies:  US Abdomen Complete  03/24/2016  CLINICAL DATA:  60 year old male with a history of cholelithiasis. EXAM: ABDOMEN ULTRASOUND COMPLETE COMPARISON:  CT 03/21/2016 FINDINGS: Gallbladder: Hyperechoic material layer dependently within the gallbladder. No gallbladder wall thickening or pericholecystic fluid. Negative sonographic Murphy's sign. Gallbladder wall thickness measures 2 mm -3 mm. Common bile duct: Diameter: 6 mm Liver: Hypoechoic lesion without significant through transmission involving the left lobe of the liver measures 1.0 cm x 0.8 cm x 1.1 cm. IVC: No abnormality visualized. Pancreas: Visualized portion unremarkable. Spleen: Size and appearance within normal limits. Right Kidney: Length: 11.0. Echogenicity within normal limits. No mass or hydronephrosis visualized. Left Kidney: Length: 10.8. Echogenicity within normal limits. No mass or hydronephrosis visualized. Abdominal aorta: No aneurysm visualized. Other  findings: None. IMPRESSION: Cholelithiasis without sonographic evidence of acute cholecystitis. Left liver lobe lesion measures 1.1 cm in greatest diameter. Lesion may represent a benign cyst, although there is not significant through transmission. Further evaluation with either contrast-enhanced abdominal CT or  contrast-enhanced MR recommended, particularly if the patient has a history of malignancy. Signed, Dulcy Fanny. Earleen Newport, DO [facility] Electronically Signed   By: Corrie Mckusick D.O.   On: 03/24/2016 09:53   Dg Chest Port 1 View  03/24/2016  CLINICAL DATA:  Endotracheal tube placement.  Initial encounter. EXAM: PORTABLE CHEST 1 VIEW COMPARISON:  Chest radiograph performed 04/12/2016 FINDINGS: The patient's endotracheal tube is seen ending 12 cm above the carina. This was subsequently advanced. The enteric tube is noted extending below the diaphragm, ending about the body of the stomach. The right IJ line is noted ending about the distal SVC. Right midlung airspace opacity resolves on the subsequent study and likely reflects atelectasis. No pleural effusion or pneumothorax is seen. The cardiomediastinal silhouette is normal in size. No acute osseous abnormalities are identified. IMPRESSION: 1. Endotracheal tube seen ending 12 cm above the chronic. This was subsequently advanced. 2. Right midlung airspace opacity resolves on the subsequent study and likely reflects atelectasis. Electronically Signed   By: Garald Balding M.D.   On: 03/24/2016 03:21   Dg Chest Port 1 View  03/24/2016  CLINICAL DATA:  Endotracheal tube placement.  Initial encounter. EXAM: PORTABLE CHEST 1 VIEW COMPARISON:  Chest radiograph performed earlier today at 2:56 a.m. FINDINGS: The patient's endotracheal tube is seen ending 6 cm above the carina. An enteric tube is noted extending below the diaphragm. A right IJ line is seen ending about the mid SVC. The lungs are well-aerated.  Mild vascular congestion is noted. There is no evidence of focal opacification, pleural effusion or pneumothorax. The cardiomediastinal silhouette is within normal limits. No acute osseous abnormalities are seen. IMPRESSION: 1. Endotracheal tube seen ending 6 cm above the carina. 2. Mild vascular congestion noted.  Lungs remain grossly clear. Electronically Signed   By: Garald Balding M.D.   On: 03/24/2016 03:18    Assessment: 60 y.o.   1. Severe hypercalcemia, improved  2. Mental status secondary to #1, intubated  3. Chest pain-likely secondary to a rib fracture and potentially bone metastases 4. COPD 5. Mediastinal lymphadenopathy 6. Leukocytosis, flow cytometry was negative  7. Intermittent Fever, sepsis vs underline disease related   Plan: -His calcium has come down significantly, continue IVF, calcitonin will be stopped.  -I suspect he has lymphoma or sarcoidosis. The key is to get lymph node biopsy. He does not have any peripheral lymph nodes, would need bronchoscopy and a EBUS or mediastinoscopy lymph node biopsy.  -CTS Dr. Charlaine Dalton plans for bronchoscopy and ebus guided mediastinal lymph node biopsy on Tuesday, June 13. We needs core needle biopsy, and hopefully the biopsy will be done ASAP so treatment can be initiated after diagnosis is made  -will continue holding steroids for now  I spoke with pt's sister and wife at bed side.   Dr. Eddie North will resume follow up from tomorrow.   Truitt Merle, MD 03/25/2016  1:45 PM

## 2016-03-25 NOTE — Progress Notes (Signed)
Admit: 04/03/2016 LOS: 3  39M presenting with severe hypercalcemia, hx/o tobacco use, AMS, CKD3, HTN, SIRS  Subjective:  Calcium improving, baseline GFR Low grade fevers  NS@ 33m/hr SBT this AM   06/10 0701 - 06/11 0700 In: 8095 [I.V.:7486; IV [id number]:585]Out: 3155 [Urine:3155]  Filed Weights   04/13/2016 1909 03/23/16 0400 03/25/16 0330  Weight: 81.7 kg (180 lb 1.9 oz) 82.1 kg (181 lb) 86 kg (189 lb 9.5 oz)    Scheduled Meds: . antiseptic oral rinse  7 mL Mouth Rinse QID  . chlorhexidine gluconate (SAGE KIT)  15 mL Mouth Rinse BID  . enoxaparin (LOVENOX) injection  40 mg Subcutaneous Q24H  . feeding supplement (PRO-STAT SUGAR FREE 64)  30 mL Per Tube BID  . magnesium sulfate LVP 250-500 ml  6 g Intravenous Once  . metoprolol  2.5 mg Intravenous Q6H  . pantoprazole sodium  40 mg Per Tube Daily  . piperacillin-tazobactam (ZOSYN)  IV  3.375 g Intravenous Q8H  . sodium chloride flush  3 mL Intravenous Q12H   Continuous Infusions: . sodium chloride 300 mL/hr at 03/25/16 0607  . feeding supplement (VITAL 1.5 CAL)    . propofol (DIPRIVAN) infusion 30 mcg/kg/min (03/25/16 0700)   PRN Meds:.acetaminophen **OR** acetaminophen, bisacodyl, hydrALAZINE, ondansetron **OR** ondansetron (ZOFRAN) IV, polyethylene glycol  Current Labs: reviewed   Relevant Labs PTH 9 1,25 Vit D pending PTH-RP pending SPEP and sFLC pending  Physical Exam:  Blood pressure 171/82, pulse 81, temperature 98.7 F (37.1 C), temperature source Oral, resp. rate 18, height 5' 8"  (1.727 m), weight 86 kg (189 lb 9.5 oz), SpO2 97 %. GEN: intubated/sedated ENT: NCAT EYES: won't open CV: RRR PULM: CTAB  ABD: s/nt/nd SKIN: no rashes/lesions EXT:no edema  Vit D 1,25 pending PTHrP pending  Assessment  1. Hypercalcemia, Severe; likely malignant. Improving; Suppressed PTH 1. Agree with calcitonin, bisphosphonate, hydration 2. No need for HD 2. Likely malignancy, LAN in chest, for LN Bx by bronch next  week with thoracic surgery 3. HTN 4. CKD3  Plan 1. Aggressive hydration, Cont NS to 3014mhr 2. PTHrp and 1,25 Vit D pending, nl Phos leans towards 1,25 Vit D process 3. Will let calcitonin stop now, 72h therapy completed  RyPearson GrippeD 03/25/2016, 8:16 AM   Recent Labs Lab 03/24/16 1219 03/24/16 1815 03/24/16 1910 03/25/16 0110 03/25/16 0440 03/25/16 0700  NA  --   --  145 148*  --  149*  K  --   --  3.5 3.4*  --  3.8  CL  --   --  113* 116*  --  116*  CO2  --   --  25 25  --  24  GLUCOSE  --   --  119* 120*  --  114*  BUN  --   --  43* 41*  --  39*  CREATININE  --   --  1.54* 1.59*  --  1.44*  CALCIUM  --   --  13.3* 12.5*  --  12.1*  PHOS 3.6 2.4*  --   --  2.1*  --     Recent Labs Lab 03/19/16 0947 03/23/2016 0809 03/23/16 0705 03/24/16 0207 03/25/16 0440  WBC 23.0 Repeated and verified X2.* 24.9* 23.9* 18.4* 21.3*  NEUTROABS 15.6* 18.4*  --   --   --   HGB 14.0 15.3 13.5 12.1* 11.3*  HCT 41.7 45.9 41.9 38.9* 36.5*  MCV 90.6 91.4 92.1 94.4 95.8  PLT 296.0 232 222 172 145*

## 2016-03-25 NOTE — Progress Notes (Signed)
[facility] Progress Note Patient Name: [name] DOB: [birth year] MRN: [id number]   Date of Service  03/25/2016  HPI/Events of Note  Hypokalemia  eICU Interventions  Potassium replaced     Intervention Category Minor Interventions: Agitation / anxiety - evaluation and management;Electrolytes abnormality - evaluation and management  Arden Tinoco 03/25/2016, 2:06 AM

## 2016-03-25 NOTE — Progress Notes (Signed)
PULMONARY / CRITICAL CARE MEDICINE   Name: [name] MRN: [id number] DOB: 11-14-1955    ADMISSION DATE:  03/24/2016 CONSULTATION DATE:  03/23/16  CHIEF COMPLAINT:  Altered mental status  HISTORY OF PRESENT ILLNESS:   60yo male smoker (40 pack years) with hx COPD, CKD 3, HTN, dCHF, anxiety, GERD recently treated for ?PNA, presented 6/8 with SIRS and progressive AMS.  Found to have mediastinal adenopathy and significant refractory hypercalcemia.  Initially admitted by [facility] to SDU but had progression of AMS requiring intubation 6/9 and tx ICU.     SUBJECTIVE:  Ca improving Still not following commands Hypertensive Fever again overnight  VITAL SIGNS: BP 181/89 mmHg  Pulse 81  Temp(Src) 98.6 F (37 C) (Oral)  Resp 18  Ht 5\' 8"  (1.727 m)  Wt 86 kg (189 lb 9.5 oz)  BMI 28.83 kg/m2  SpO2 96%  HEMODYNAMICS:    VENTILATOR SETTINGS: Vent Mode:  [-] PRVC FiO2 (%):  [40 %] 40 % Set Rate:  [16 bmp] 16 bmp Vt Set:  [550 mL] 550 mL PEEP:  [5 cmH20] 5 cmH20 Pressure Support:  [10 cmH20] 10 cmH20 Plateau Pressure:  [13 cmH20-14 cmH20] 13 cmH20  INTAKE / OUTPUT: I/O last 3 completed shifts: In: 14427.4 [I.V.:13368.4; IV Piggyback:1059] Out: [id number] [Urine:4980; Emesis/NG output:50]  PHYSICAL EXAMINATION: General:  On vent Neuro:  purposeful movements but not following commands, opens eyes to voice HEENT:  Dry mucus membranes, ETT in place Cardiovascular:  RRR, no mgr Lungs:  CTA B vent supported breaths Abdomen:  Soft, nontender Musculoskeletal: no bony abnormalities Skin:  No rashes  LABS:  BMET  Recent Labs Lab 03/24/16 1910 03/25/16 0110 03/25/16 0700  NA 145 148* 149*  K 3.5 3.4* 3.8  CL 113* 116* 116*  CO2 25 25 24   BUN 43* 41* 39*  CREATININE 1.54* 1.59* 1.44*  GLUCOSE 119* 120* 114*    Electrolytes  Recent Labs Lab 03/24/16 1219 03/24/16 1815 03/24/16 1910 03/25/16 0110 03/25/16 0440 03/25/16 0700  CALCIUM  --   --  13.3* 12.5*  --  12.1*  MG  1.4* 1.3*  --   --  1.3*  --   PHOS 3.6 2.4*  --   --  2.1*  --     CBC  Recent Labs Lab 03/23/16 0705 03/24/16 0207 03/25/16 0440  WBC 23.9* 18.4* 21.3*  HGB 13.5 12.1* 11.3*  HCT 41.9 38.9* 36.5*  PLT 222 172 145*    Coag's  Recent Labs Lab 04/12/2016 1204  APTT 38*  INR 1.17    Sepsis Markers  Recent Labs Lab 03/23/2016 1204  03/31/2016 2142 03/23/16 0039 03/23/16 0705  LATICACIDVEN  --   < > 2.9* 2.9* 2.9*  PROCALCITON 0.37  --   --   --   --   < > = values in this interval not displayed.  ABG  Recent Labs Lab 03/24/16 0357  PHART 7.397  PCO2ART 46.6*  PO2ART 104.0*    Liver Enzymes  Recent Labs Lab 03/19/16 0947 04/03/2016 0809 03/23/16 1136  AST 16 27 23   ALT 11 19 20   ALKPHOS 131* 113 89  BILITOT 0.3 0.8 0.7  ALBUMIN 3.8 3.2* 2.7*    Cardiac Enzymes  Recent Labs Lab 03/23/16 1453 03/23/16 1940 03/24/16 0207  TROPONINI 0.06* 0.06* 0.06*    Glucose  Recent Labs Lab 03/24/16 1212 03/24/16 1645 03/24/16 2024 03/25/16 0002 03/25/16 0349 03/25/16 0826  GLUCAP 120* 130* 112* 124* 110* 113*    Imaging No results  found.  ABX:  Vanc 6/9>>> Zosyn 6/9>>>  MICRO:  BC x 2 6/9>>> Urine 6/9>>>  LINES/TUBES: ETT 6/10>>> R IJ HD cath 6/10>>> remove 6/11   DISCUSSION: Mr. [name] is a 60 y/o man with mediastinal adenopathy, hypercalcemia and AMS.  Fever and leukocytosis without source of infection.   ASSESSMENT / PLAN:  PULMONARY A: Acute respiratory failure -- r/t AMS  P:   Maintain on PRVC For bronch with bx next week (CVTS)  F/u CXR  Plan for extubation once MS improves and Ca under control  CARDIOVASCULAR A:  HTN CHF P:  Restart home amlodipine for now  Monitor   RENAL A:   Severe Hypercalcemia > improving, likely due to malignancy or sarcoidosis Acute renal failure CKD 3 P:   Favor malignancy as likely source Calcitonin per renal Appreciate renal assistance Monitor BMET and UOP Replace electrolytes as  needed   GASTROINTESTINAL A:   No active issues P:   TF per nutrition   HEMATOLOGIC A:   Mediastinal adenopathy P:  Plan for EBUS early next week, once adenopathy improves - CVTS planning this   INFECTIOUS A:   SIRS  Fever > unclear source, line? May also be due to lymphoma or other inflammatory process Doubt HCAP P:   Remove central line F/u cultures   ENDOCRINE A:   Hypercalcemia P:   Monitor glucose on chem   NEUROLOGIC A:   Metabolic encephalopathy due to hypercalecmia  Need for sedation P:   RASS goal: 0 Propofol - wean as able  Management of hyperCa   FAMILY  - Updates: sister updated at bedside 6/11  - Inter-disciplinary family meet or Palliative Care meeting due by: 6/17  My cc time 35 minutes  Roselie Awkward, MD [facility] Pager: [phone] Cell: [phone] After 3pm or if no response, call [phone]

## 2016-03-25 NOTE — Progress Notes (Signed)
[facility] Progress Note Patient Name: [name] DOB: Oct 17, 1955 MRN: [id number]   Date of Service  03/25/2016  HPI/Events of Note    eICU Interventions  Hypokalemia/ hypophos -repleted      Intervention Category Intermediate Interventions: Electrolyte abnormality - evaluation and management  ALVA,RAKESH V. 03/25/2016, 7:25 PM

## 2016-03-25 NOTE — Progress Notes (Signed)
Procedure(s) (LRB): VIDEO BRONCHOSCOPY WITH ENDOBRONCHIAL ULTRASOUND (N/A) Subjective: 60 year old male smoker with mediastinal adenopathy and hypercalcemia With calcitonin therapy his serum calcium is 12.2, improved Patient still has leukocytosis 21 k and fever 101.4 last p.m. He remains on ventilator, depressed altered status Transbronchial ultrasound-guided mediastinal node biopsies planned in the OR for suspicion of malignancy when patient's signs of sepsis-SIRS is improvied  Objective: Vital signs in last 24 hours: Temp:  [98.6 F (37 C)-101.6 F (38.7 C)] 98.6 F (37 C) (06/11 0848) Pulse Rate:  [81-121] 81 (06/11 0700) Cardiac Rhythm:  [-] Normal sinus rhythm (06/11 0400) Resp:  [16-26] 18 (06/11 0700) BP: (145-181)/(73-133) 181/89 mmHg (06/11 0848) SpO2:  [95 %-98 %] 96 % (06/11 0906) FiO2 (%):  [40 %] 40 % (06/11 0906) Weight:  [189 lb 9.5 oz (86 kg)] 189 lb 9.5 oz (86 kg) (06/11 0330)  Hemodynamic parameters for last 24 hours:  sinus rhythm  Intake/Output from previous day: 06/10 0701 - 06/11 0700 In: 8095 [I.V.:7486; IV Piggyback:609] Out: 3155 [Urine:3155] Intake/Output this shift: Total I/O In: 350.7 [I.V.:350.7] Out: 400 [Urine:400]    Lab Results:  Recent Labs  03/24/16 0207 03/25/16 0440  WBC 18.4* 21.3*  HGB 12.1* 11.3*  HCT 38.9* 36.5*  PLT 172 145*   BMET:  Recent Labs  03/25/16 0110 03/25/16 0700  NA 148* 149*  K 3.4* 3.8  CL 116* 116*  CO2 25 24  GLUCOSE 120* 114*  BUN 41* 39*  CREATININE 1.59* 1.44*  CALCIUM 12.5* 12.1*    PT/INR:  Recent Labs  04/13/2016 1204  LABPROT 15.0  INR 1.17   ABG    Component Value Date/Time   PHART 7.397 03/24/2016 0357   HCO3 28.6* 03/24/2016 0357   TCO2 30 03/24/2016 0357   O2SAT 98.0 03/24/2016 0357   CBG (last 3)   Recent Labs  03/25/16 0002 03/25/16 0349 03/25/16 0826  GLUCAP 124* 110* 113*    Assessment/Plan: S/P Procedure(s) (LRB): VIDEO BRONCHOSCOPY WITH ENDOBRONCHIAL  ULTRASOUND (N/A) EBUS biopsy of mediastinal nodes later this hospitalization when patient stabilizes. We'll follow   LOS: 3 days    Tharon Aquas Trigt III 03/25/2016

## 2016-03-25 NOTE — Progress Notes (Signed)
[facility] ADULT ICU REPLACEMENT PROTOCOL FOR AM LAB REPLACEMENT ONLY  The patient does apply for the [facility] Adult ICU Electrolyte Replacment Protocol based on the criteria listed below:   1. Is GFR >/= 40 ml/min? Yes.    Patient's GFR today is 46 2. Is urine output >/= 0.5 ml/kg/hr for the last 6 hours? Yes.   Patient's UOP is 1.2 ml/kg/hr 3. Is BUN < 60 mg/dL? Yes.    Patient's BUN today is 41 4. Abnormal electrolyte(s):Mg 1.3 5. Ordered repletion with: per protocol 6. If a panic level lab has been reported, has the CCM MD in charge been notified? No..   Physician:    Ronda Fairly A 03/25/2016 6:21 AM

## 2016-03-26 ENCOUNTER — Inpatient Hospital Stay (HOSPITAL_COMMUNITY): Payer: BLUE CROSS/BLUE SHIELD

## 2016-03-26 DIAGNOSIS — J9690 Respiratory failure, unspecified, unspecified whether with hypoxia or hypercapnia: Secondary | ICD-10-CM

## 2016-03-26 DIAGNOSIS — J9601 Acute respiratory failure with hypoxia: Secondary | ICD-10-CM | POA: Insufficient documentation

## 2016-03-26 DIAGNOSIS — Z9911 Dependence on respirator [ventilator] status: Secondary | ICD-10-CM

## 2016-03-26 DIAGNOSIS — C7951 Secondary malignant neoplasm of bone: Secondary | ICD-10-CM

## 2016-03-26 LAB — GLUCOSE, CAPILLARY
GLUCOSE-CAPILLARY: 139 mg/dL — AB (ref 65–99)
GLUCOSE-CAPILLARY: 108 mg/dL — AB (ref 65–99)
GLUCOSE-CAPILLARY: 107 mg/dL — AB (ref 65–99)
GLUCOSE-CAPILLARY: 160 mg/dL — AB (ref 65–99)
GLUCOSE-CAPILLARY: 168 mg/dL — AB (ref 65–99)
Glucose-Capillary: 137 mg/dL — ABNORMAL HIGH (ref 65–99)

## 2016-03-26 LAB — BASIC METABOLIC PANEL
ANION GAP: 9 (ref 5–15)
BUN: 29 mg/dL — ABNORMAL HIGH (ref 6–20)
CHLORIDE: 120 mmol/L — AB (ref 101–111)
CO2: 22 mmol/L (ref 22–32)
Calcium: 10.2 mg/dL (ref 8.9–10.3)
Creatinine, Ser: 1.23 mg/dL (ref 0.61–1.24)
GFR calc non Af Amer: >60 mL/min (ref >60)
Glucose, Bld: 113 mg/dL — ABNORMAL HIGH (ref 65–99)
POTASSIUM: 3.3 mmol/L — AB (ref 3.5–5.1)
SODIUM: 151 mmol/L — AB (ref 135–145)

## 2016-03-26 LAB — CBC
HEMATOCRIT: 36.6 % — AB (ref 39.0–52.0)
HEMOGLOBIN: 11.4 g/dL — AB (ref 13.0–17.0)
MCH: 29.3 pg (ref 26.0–34.0)
MCHC: 31.1 g/dL (ref 30.0–36.0)
MCV: 94.1 fL (ref 78.0–100.0)
Platelets: 107 10*3/uL — ABNORMAL LOW (ref 150–400)
RBC: 3.89 MIL/uL — AB (ref 4.22–5.81)
RDW: 14.5 % (ref 11.5–15.5)
WBC: 22.4 10*3/uL — AB (ref 4.0–10.5)

## 2016-03-26 LAB — MAGNESIUM: Magnesium: 1.7 mg/dL (ref 1.7–2.4)

## 2016-03-26 LAB — KAPPA/LAMBDA LIGHT CHAINS
Kappa free light chain: 37 mg/L — ABNORMAL HIGH (ref 3.3–19.4)
Kappa, lambda light chain ratio: 1.19 (ref 0.26–1.65)
Lambda free light chains: 31.1 mg/L — ABNORMAL HIGH (ref 5.7–26.3)

## 2016-03-26 MED ORDER — MAGNESIUM SULFATE 2 GM/50ML IV SOLN
2.0000 g | Freq: Once | INTRAVENOUS | Status: AC
  Administered 2016-03-26: 2 g via INTRAVENOUS
  Filled 2016-03-26: qty 50

## 2016-03-26 MED ORDER — SODIUM CHLORIDE 0.9 % IV SOLN
INTRAVENOUS | Status: DC
  Administered 2016-03-26: 16:00:00 via INTRAVENOUS

## 2016-03-26 MED ORDER — POTASSIUM CHLORIDE 20 MEQ/15ML (10%) PO SOLN
40.0000 meq | Freq: Once | ORAL | Status: AC
  Administered 2016-03-26: 40 meq
  Filled 2016-03-26: qty 30

## 2016-03-26 MED ORDER — POTASSIUM CHLORIDE 10 MEQ/50ML IV SOLN
10.0000 meq | INTRAVENOUS | Status: AC
Start: 2016-03-26 — End: 2016-03-26
  Administered 2016-03-26 (×2): 10 meq via INTRAVENOUS
  Filled 2016-03-26 (×2): qty 50

## 2016-03-26 MED ORDER — METOPROLOL TARTRATE 25 MG/10 ML ORAL SUSPENSION
12.5000 mg | Freq: Two times a day (BID) | ORAL | Status: DC
  Administered 2016-03-26 – 2016-03-31 (×8): 12.5 mg
  Filled 2016-03-26 (×11): qty 5

## 2016-03-26 MED ORDER — FREE WATER
200.0000 mL | Freq: Four times a day (QID) | Status: DC
  Administered 2016-03-26 – 2016-03-27 (×5): 200 mL

## 2016-03-26 MED ORDER — DEXTROSE 5 % IV SOLN
INTRAVENOUS | Status: DC
  Administered 2016-03-26: 19:00:00 via INTRAVENOUS
  Administered 2016-03-27: 125 mL via INTRAVENOUS

## 2016-03-26 NOTE — Consult Note (Signed)
Pharmacy Antibiotic Note  [name] is a 60 y.o. male who was seen in the ED on 03/16/16 and diagnosed with possible pneumonia. He was sent home on Levaquin. He returned to the ED on 04/12/2016 with AMS - code sepsis called.  Pharmacy managing Zosyn.  Patient's renal function has returned to baseline.   Plan: - Continue Zosyn 3.375g IV Q8H, 4 hr infusion - Monitor renal fxn, clinical course, abx LOT - Watch TG closely  Height: 5\' 8"  (172.7 cm) Weight: 196 lb 6.9 oz (89.1 kg) IBW/kg (Calculated) : 68.4 Temp (24hrs), Avg:99.6 F (37.6 C), Min:99.2 F (37.3 C), Max:100.1 F (37.8 C)   Recent Labs Lab 03/15/2016 0809  04/06/2016 1222 03/16/2016 1840 04/04/2016 2142 03/23/16 0039 03/23/16 0705  03/24/16 0207  03/25/16 0110 03/25/16 0440 03/25/16 0700 03/25/16 1826 03/25/16 2035 03/26/16 0448  WBC 24.9*  --   --   --   --   --  23.9*  --  18.4*  --   --  21.3*  --   --   --  22.4*  CREATININE 1.97*  --   --  1.52*  --   --  1.54*  < >  --   < > 1.59*  --  1.44* 1.38* 1.28* 1.23  LATICACIDVEN  --   < > 2.81* 2.6* 2.9* 2.9* 2.9*  --   --   --   --   --   --   --   --   --   < > = values in this interval not displayed. Estimated creatinine clearance: 42 ml/min  Allergies  Allergen Reactions  . Lisinopril Cough  . Tramadol Other (See Comments)    Insomnia    Antimicrobials this admission: 6/8 Vancomycin >> 6/8 Zosyn >>  Dose adjustments this admission: N/a  Microbiology results: 6/8 BCx2>>NGTD 6/8 UCx>> NEG 6/8 MRSA - NEG   [name] D. Mina Marble, PharmD, BCPS Pager:  [phone] 03/26/2016, 2:10 PM

## 2016-03-26 NOTE — Progress Notes (Signed)
PULMONARY / CRITICAL CARE MEDICINE   Name: [name] MRN: [id number] DOB: Nov 09, 1955    ADMISSION DATE:  04/10/2016 CONSULTATION DATE:  03/23/16  CHIEF COMPLAINT:  Altered mental status  HISTORY OF PRESENT ILLNESS:   60yo male smoker (40 pack years) with hx COPD, CKD 3, HTN, dCHF, anxiety, GERD recently treated for ?PNA, presented 6/8 with SIRS and progressive AMS.  Found to have mediastinal adenopathy and significant refractory hypercalcemia.  Initially admitted by [facility] to SDU but had progression of AMS requiring intubation 6/9 and tx ICU.     SUBJECTIVE:  Ca improving.  Still not following commands. On PS 15/5.   VITAL SIGNS: BP 139/85 mmHg  Pulse 87  Temp(Src) 99.4 F (37.4 C) (Oral)  Resp 26  Ht 5\' 8"  (1.727 m)  Wt 89.1 kg (196 lb 6.9 oz)  BMI 29.87 kg/m2  SpO2 98%  HEMODYNAMICS:    VENTILATOR SETTINGS: Vent Mode:  [-] CPAP;PSV FiO2 (%):  [40 %] 40 % Set Rate:  [16 bmp] 16 bmp Vt Set:  [550 mL] 550 mL PEEP:  [5 cmH20] 5 cmH20 Pressure Support:  [5 cmH20-10 cmH20] 5 cmH20 Plateau Pressure:  [15 cmH20] 15 cmH20  INTAKE / OUTPUT: I/O last 3 completed shifts: In: 15141.8 [I.V.:13920.3; IV Piggyback:1221.5] Out: [id number] [Urine:6230]  PHYSICAL EXAMINATION: General:  On vent Neuro:  purposeful movements but not following commands, opens eyes to voice HEENT:  Dry mucus membranes, ETT in place Cardiovascular:  RRR, no mgr Lungs:  CTA B, non labored on PS 15/5 Abdomen:  Soft, nontender Musculoskeletal: no bony abnormalities Skin:  No rashes  LABS:  BMET  Recent Labs Lab 03/25/16 1826 03/25/16 2035 03/26/16 0448  NA 148* 147* 151*  K 2.9* 3.0* 3.3*  CL 115* 119* 120*  CO2 21* 21* 22  BUN 34* 33* 29*  CREATININE 1.38* 1.28* 1.23  GLUCOSE 115* 111* 113*    Electrolytes  Recent Labs Lab 03/24/16 1815  03/25/16 0440  03/25/16 1431 03/25/16 1826 03/25/16 2017 03/25/16 2035 03/26/16 0448  CALCIUM  --   < >  --   < >  --  10.8*  --  10.9* 10.2  MG  1.3*  --  1.3*  --  2.5*  --  2.2  --  1.7  PHOS 2.4*  --  2.1*  --  1.6*  --   --   --   --   < > = values in this interval not displayed.  CBC  Recent Labs Lab 03/24/16 0207 03/25/16 0440 03/26/16 0448  WBC 18.4* 21.3* 22.4*  HGB 12.1* 11.3* 11.4*  HCT 38.9* 36.5* 36.6*  PLT 172 145* 107*    Coag's  Recent Labs Lab 03/31/2016 1204  APTT 38*  INR 1.17    Sepsis Markers  Recent Labs Lab 04/11/2016 1204  04/05/2016 2142 03/23/16 0039 03/23/16 0705  LATICACIDVEN  --   < > 2.9* 2.9* 2.9*  PROCALCITON 0.37  --   --   --   --   < > = values in this interval not displayed.  ABG  Recent Labs Lab 03/24/16 0357  PHART 7.397  PCO2ART 46.6*  PO2ART 104.0*    Liver Enzymes  Recent Labs Lab 03/20/2016 0809 03/23/16 1136  AST 27 23  ALT 19 20  ALKPHOS 113 89  BILITOT 0.8 0.7  ALBUMIN 3.2* 2.7*    Cardiac Enzymes  Recent Labs Lab 03/23/16 1453 03/23/16 1940 03/24/16 0207  TROPONINI 0.06* 0.06* 0.06*    Glucose  Recent Labs Lab 03/25/16 1221 03/25/16 1637 03/25/16 1939 03/25/16 2359 03/26/16 0343 03/26/16 0837  GLUCAP 102* 115* 112* 137* 139* 108*    Imaging Dg Chest Port 1 View  03/26/2016  CLINICAL DATA:  Respiratory failure. EXAM: PORTABLE CHEST 1 VIEW COMPARISON:  03/24/2016. FINDINGS: Endotracheal tube 3.5 cm above the carina. Right IJ line in stable position. Interim removal of NG tube. Heart size stable. New onset bilateral lower lobe infiltrates and or edema noted. Small left pleural effusion. Heart size stable. No pneumothorax. IMPRESSION: 1. Endotracheal tube 3.5 cm above the carina. Right IJ line in stable position. Interim removal of NG tube. 2. New onset bilateral lower lobe infiltrates and/or edema, left side greater than right. Small left pleural effusion. Electronically Signed   By: Marcello Moores  Register   On: 03/26/2016 06:58    ABX:  Deniece Ree 6/9>>> Zosyn 6/9>>>  MICRO:  BC x 2 6/9>>> Urine 6/9>>>  LINES/TUBES: ETT 6/10>>> R IJ HD  cath 6/10>>> remove 6/11   DISCUSSION: Mr. Angstadt is a 60 y/o man with mediastinal adenopathy, hypercalcemia and AMS.  Fever and leukocytosis without source of infection.   ASSESSMENT / PLAN:  PULMONARY A: Acute respiratory failure -- r/t AMS  P:   Daily SBT  PS wean - mental status does not yet support extubation  For bronch with bx on 6/13 F/u CXR  Plan for extubation once MS improves and Ca under control  CARDIOVASCULAR A:  HTN CHF Tachycardia  P:  continue home amlodipine for now  metoprolol to 12.5mg  BID per tube  Monitor   RENAL A:   Severe Hypercalcemia > improving, likely due to malignancy or sarcoidosis Acute renal failure CKD 3 Hypernatremia  P:   Favor malignancy as likely source Calcitonin per renal Appreciate renal assistance Monitor BMET and UOP Replace electrolytes as needed Add free water 6/12  GASTROINTESTINAL A:   No active issues P:   TF per nutrition   HEMATOLOGIC A:   Mediastinal adenopathy P:  Plan for EBUS early next week, once adenopathy improves - CVTS planning this   INFECTIOUS A:   SIRS  Fever > unclear source, line? May also be due to lymphoma or other inflammatory process Doubt HCAP P:   Remove central line F/u cultures   ENDOCRINE A:   Hypercalcemia P:   Monitor glucose on chem   NEUROLOGIC A:   Metabolic encephalopathy due to hypercalecmia  Need for sedation P:   RASS goal: 0 Propofol - wean as able  Management of hyperCa   FAMILY  - Updates: wife updated at bedside 6/12  - Inter-disciplinary family meet or Palliative Care meeting due by: 6/17  Nickolas Madrid, NP 03/26/2016  9:53 AM Pager: [phone] or [phone]   Attending Note:  I have examined patient, reviewed labs, studies and notes. I have discussed the case with Shon Millet, and I agree with the data and plans as amended above. 60 yo man, encephalopathy and acute resp failure in setting hypercalcemia. Etiology likely malignancy.  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 On evaluation today he remains encephalopathic, does not follow commands. He tolerates some pressure support. Clear lung exam. Will plan to continue to treat his hypercalcemia, assess daily for extubation - will defer until after his EBUS and biopsies, planned 6/13 w Dr Darcey Nora. Independent critical care time is 35 minutes.   Baltazar Apo, MD, PhD 03/26/2016, 1:56 PM [facility] [phone] or if no  answer [phone]

## 2016-03-26 NOTE — Progress Notes (Signed)
IP PROGRESS NOTE  Subjective:   He remains sedated on the ventilator.  Objective: Vital signs in last 24 hours: Blood pressure 181/95, pulse 119, temperature 99.6 F (37.6 C), temperature source Oral, resp. rate 27, height 5\' 8"  (1.727 m), weight 196 lb 6.9 oz (89.1 kg), SpO2 98 %.  Intake/Output from previous day: 06/11 0701 - 06/12 0700 In: 11414.7 [I.V.:10443.2; IV Piggyback:971.5] Out: [id number] [Urine:4675]  Physical Exam:  Not following commands    Lab Results:  Recent Labs  03/25/16 0440 03/26/16 0448  WBC 21.3* 22.4*  HGB 11.3* 11.4*  HCT 36.5* 36.6*  PLT 145* 107*    BMET  Recent Labs  03/25/16 2035 03/26/16 0448  NA 147* 151*  K 3.0* 3.3*  CL 119* 120*  CO2 21* 22  GLUCOSE 111* 113*  BUN 33* 29*  CREATININE 1.28* 1.23  CALCIUM 10.9* 10.2    Studies/Results: Dg Chest Port 1 View  03/26/2016  CLINICAL DATA:  Respiratory failure. EXAM: PORTABLE CHEST 1 VIEW COMPARISON:  03/24/2016. FINDINGS: Endotracheal tube 3.5 cm above the carina. Right IJ line in stable position. Interim removal of NG tube. Heart size stable. New onset bilateral lower lobe infiltrates and or edema noted. Small left pleural effusion. Heart size stable. No pneumothorax. IMPRESSION: 1. Endotracheal tube 3.5 cm above the carina. Right IJ line in stable position. Interim removal of NG tube. 2. New onset bilateral lower lobe infiltrates and/or edema, left side greater than right. Small left pleural effusion. Electronically Signed   By: Marcello Moores  Register   On: 03/26/2016 06:58    Medications: I have reviewed the patient's current medications.  Assessment/Plan:  1. Hypercalcemia-Improved 2. Altered Mental status secondary to #1 3. Chest pain-likely secondary to a rib fracture and potentially bone metastases 4. COPD 5. Mediastinal lymphadenopathy 6. Leukocytosis 7. Fever 8. Respiratory failure secondary to altered mental status, status post intubation 03/23/2016  Mr. Boeke remains on  the ventilator with altered metal status. The hypercalcemia has improved. I suspect he has an underlying malignancy. I reviewed the chest CT again today and he does have lytic bone lesions and a left rib fracture. A myeloma panel is pending, and SPEP from 03/19/2016 revealed a restricted band in the beta globulin region, but no apparent monoclonal protein.  Recommendations: 1. Continue management of respiratory failure per critical care medicine 2. Bone scan when he is off of the ventilator 3. Proceed with a diagnostic mediastinal lymph node biopsy when appropriate per critical care and CVTS  Oncology will see him daily.   LOS: 4 days   Betsy Coder, MD   03/26/2016, 1:41 PM

## 2016-03-26 NOTE — Progress Notes (Signed)
[facility] ADULT ICU REPLACEMENT PROTOCOL FOR AM LAB REPLACEMENT ONLY  The patient does apply for the [facility] Adult ICU Electrolyte Replacment Protocol based on the criteria listed below:   1. Is GFR >/= 40 ml/min? Yes.    Patient's GFR today is >60 2. Is urine output >/= 0.5 ml/kg/hr for the last 6 hours? Yes.   Patient's UOP is 1.9 ml/kg/hr 3. Is BUN < 60 mg/dL? Yes.    Patient's BUN today is 29 4. Abnormal electrolyte(s):K 3.3, Mag 1.7 5. Ordered repletion with: per protocol 6. If a panic level lab has been reported, has the CCM MD in charge been notified? No..   Physician:    Ronda Fairly A 03/26/2016 6:15 AM

## 2016-03-26 NOTE — Progress Notes (Signed)
[facility] ROUNDING NOTE   Subjective:   Interval History:  No complaints today  Wife at bedside  Objective:  Vital signs in last 24 hours:  Temp:  [99.2 F (37.3 C)-102.8 F (39.3 C)] 102.8 F (39.3 C) (06/12 1609) Pulse Rate:  [83-132] 132 (06/12 1535) Resp:  [16-32] 30 (06/12 1535) BP: (139-192)/(82-106) 173/93 mmHg (06/12 1535) SpO2:  [93 %-99 %] 97 % (06/12 1500) FiO2 (%):  [40 %] 40 % (06/12 1536) Weight:  [89.1 kg (196 lb 6.9 oz)] 89.1 kg (196 lb 6.9 oz) (06/12 0400)  Weight change: 3.1 kg (6 lb 13.4 oz) Filed Weights   03/23/16 0400 03/25/16 0330 03/26/16 0400  Weight: 82.1 kg (181 lb) 86 kg (189 lb 9.5 oz) 89.1 kg (196 lb 6.9 oz)    Intake/Output: I/O last 3 completed shifts: In: 15441.8 [I.V.:14220.3; IV Piggyback:1221.5] Out: 3846 [KZLDJ:5701]   Intake/Output this shift:  Total I/O In: 1369 [I.V.:1207; NG/GT:62; IV Piggyback:100] Out: 1600 [Urine:1600]  CVS- RRR RS- CTA ABD- BS present soft non-distended EXT- no edema   Basic Metabolic Panel:  Recent Labs Lab 03/24/16 0207  03/24/16 1219 03/24/16 1815  03/25/16 0110 03/25/16 0440 03/25/16 0700 03/25/16 1431 03/25/16 1826 03/25/16 2017 03/25/16 2035 03/26/16 0448  NA  --   < >  --   --   < > 148*  --  149*  --  148*  --  147* 151*  K  --   < >  --   --   < > 3.4*  --  3.8  --  2.9*  --  3.0* 3.3*  CL  --   < >  --   --   < > 116*  --  116*  --  115*  --  119* 120*  CO2  --   < >  --   --   < > 25  --  24  --  21*  --  21* 22  GLUCOSE  --   < >  --   --   < > 120*  --  114*  --  115*  --  111* 113*  BUN  --   < >  --   --   < > 41*  --  39*  --  34*  --  33* 29*  CREATININE  --   < >  --   --   < > 1.59*  --  1.44*  --  1.38*  --  1.28* 1.23  CALCIUM  --   < >  --   --   < > 12.5*  --  12.1*  --  10.8*  --  10.9* 10.2  MG  --   < > 1.4* 1.3*  --   --  1.3*  --  2.5*  --  2.2  --  1.7  PHOS 3.7  --  3.6 2.4*  --   --  2.1*  --  1.6*  --   --   --   --   < > = values in this  interval not displayed.  Liver Function Tests:  Recent Labs Lab 04/06/2016 0809 03/23/16 1136  AST 27 23  ALT 19 20  ALKPHOS 113 89  BILITOT 0.8 0.7  PROT 7.6 7.0  ALBUMIN 3.2* 2.7*   No results for input(s): LIPASE, AMYLASE in the last 168 hours.  Recent Labs Lab 03/19/2016 0845  AMMONIA 26    CBC:  Recent Labs Lab  03/24/2016 0809 03/23/16 0705 03/24/16 0207 03/25/16 0440 03/26/16 0448  WBC 24.9* 23.9* 18.4* 21.3* 22.4*  NEUTROABS 18.4*  --   --   --   --   HGB 15.3 13.5 12.1* 11.3* 11.4*  HCT 45.9 41.9 38.9* 36.5* 36.6*  MCV 91.4 92.1 94.4 95.8 94.1  PLT 232 222 172 145* 107*    Cardiac Enzymes:  Recent Labs Lab 03/23/16 1453 03/23/16 1940 03/24/16 0207  TROPONINI 0.06* 0.06* 0.06*    BNP: Invalid input(s): POCBNP  CBG:  Recent Labs Lab 03/25/16 2359 03/26/16 0343 03/26/16 0837 03/26/16 1123 03/26/16 1608  GLUCAP 137* 139* 108* 107* 160*    Microbiology: Results for orders placed or performed during the hospital encounter of 04/08/2016  Blood Culture (routine x 2)     Status: None (Preliminary result)   Collection Time: 04/03/2016  8:05 AM  Result Value Ref Range Status   Specimen Description BLOOD LEFT HAND  Final   Special Requests BOTTLES DRAWN AEROBIC AND ANAEROBIC 5CC  Final   Culture NO GROWTH 4 DAYS  Final   Report Status PENDING  Incomplete  Blood Culture (routine x 2)     Status: None (Preliminary result)   Collection Time: 03/16/2016  8:06 AM  Result Value Ref Range Status   Specimen Description BLOOD RIGHT ANTECUBITAL  Final   Special Requests BOTTLES DRAWN AEROBIC AND ANAEROBIC 10CC  Final   Culture NO GROWTH 4 DAYS  Final   Report Status PENDING  Incomplete  Urine culture     Status: None   Collection Time: 03/18/2016  9:22 AM  Result Value Ref Range Status   Specimen Description URINE, CATHETERIZED  Final   Special Requests NONE  Final   Culture NO GROWTH  Final   Report Status 03/23/2016 FINAL  Final  MRSA PCR Screening      Status: None   Collection Time: 03/18/2016  7:33 PM  Result Value Ref Range Status   MRSA by PCR NEGATIVE NEGATIVE Final    Comment:        The GeneXpert MRSA Assay (FDA approved for NASAL specimens only), is one component of a comprehensive MRSA colonization surveillance program. It is not intended to diagnose MRSA infection nor to guide or monitor treatment for MRSA infections.     Coagulation Studies: No results for input(s): LABPROT, INR in the last 72 hours.  Urinalysis: No results for input(s): COLORURINE, LABSPEC, PHURINE, GLUCOSEU, HGBUR, BILIRUBINUR, KETONESUR, PROTEINUR, UROBILINOGEN, NITRITE, LEUKOCYTESUR in the last 72 hours.  Invalid input(s): APPERANCEUR    Imaging: Dg Chest Port 1 View  03/26/2016  CLINICAL DATA:  Respiratory failure. EXAM: PORTABLE CHEST 1 VIEW COMPARISON:  03/24/2016. FINDINGS: Endotracheal tube 3.5 cm above the carina. Right IJ line in stable position. Interim removal of NG tube. Heart size stable. New onset bilateral lower lobe infiltrates and or edema noted. Small left pleural effusion. Heart size stable. No pneumothorax. IMPRESSION: 1. Endotracheal tube 3.5 cm above the carina. Right IJ line in stable position. Interim removal of NG tube. 2. New onset bilateral lower lobe infiltrates and/or edema, left side greater than right. Small left pleural effusion. Electronically Signed   By: Marcello Moores  Register   On: 03/26/2016 06:58   Dg Abd Portable 1v  03/26/2016  CLINICAL DATA:  Feeding tube placement EXAM: PORTABLE ABDOMEN - 1 VIEW COMPARISON:  11/03/2013 FINDINGS: Feeding tube tip is positioned in the distal stomach, likely at the pylorus in the tip is directed distally towards the duodenum. Bowel gas pattern  is nonspecific. IMPRESSION: Feeding tube tip is in the distal stomach near the pylorus. Electronically Signed   By: Misty Stanley M.D.   On: 03/26/2016 13:49     Medications:   . sodium chloride 100 mL/hr at 03/26/16 1617  . feeding supplement  (VITAL 1.5 CAL) 1,000 mL (03/26/16 1444)  . propofol (DIPRIVAN) infusion 30.045 mcg/kg/min (03/26/16 1542)   . amLODipine  10 mg Oral Daily  . antiseptic oral rinse  7 mL Mouth Rinse QID  . chlorhexidine gluconate (SAGE KIT)  15 mL Mouth Rinse BID  . enoxaparin (LOVENOX) injection  40 mg Subcutaneous Q24H  . feeding supplement (PRO-STAT SUGAR FREE 64)  30 mL Per Tube BID  . free water  200 mL Per Tube Q6H  . metoprolol tartrate  12.5 mg Per Tube BID  . pantoprazole sodium  40 mg Per Tube Daily  . piperacillin-tazobactam (ZOSYN)  IV  3.375 g Intravenous Q8H  . sodium chloride flush  3 mL Intravenous Q12H   acetaminophen **OR** acetaminophen, bisacodyl, hydrALAZINE, ondansetron **OR** ondansetron (ZOFRAN) IV, polyethylene glycol  Assessment/ Plan:   Assessment  1. Hypercalcemia, Severe; likely malignant. Improving; Suppressed PTH 1. Agree with calcitonin, bisphosphonate, hydration 2. No need for HD 2. Likely malignancy, LAN in chest, for LN Bx by bronch next week with thoracic surgery 3. HTN 4. CKD3 5. Hypokalemia  Replete 6. Hypernatremia will need free water   Plan 1. Aggressive hydration, Cont NS to 339m/hr 2. PTHrp and 1,25 Vit D pending, nl Phos leans towards 1,25 Vit D process 3. Will let calcitonin stop now, 72h therapy completed    LOS: 4 [name] @TODAY @5 :48 PM

## 2016-03-26 NOTE — Progress Notes (Signed)
Procedure(s) (LRB): VIDEO BRONCHOSCOPY WITH ENDOBRONCHIAL ULTRASOUND (N/A) Subjective: Patient is without purposeful response on ventilator FiO2 0.40 Blood pressure and pulse are stable off pressors Chest x-ray shows left lower lobe opacity-infiltrate with elevated white count and low-grade fevers Patient not ready for general anesthesia-bronchoscopic ultrasound directed mediastinal node biopsy tomorrow Discussed with Dr. Lamonte Sakai  Objective: Vital signs in last 24 hours: Temp:  [99.2 F (37.3 C)-102.8 F (39.3 C)] 102.8 F (39.3 C) (06/12 1609) Pulse Rate:  [83-132] 132 (06/12 1535) Cardiac Rhythm:  [-] Normal sinus rhythm (06/12 0800) Resp:  [16-32] 30 (06/12 1535) BP: (139-192)/(82-106) 173/93 mmHg (06/12 1535) SpO2:  [93 %-99 %] 97 % (06/12 1500) FiO2 (%):  [40 %] 40 % (06/12 1536) Weight:  [196 lb 6.9 oz (89.1 kg)] 196 lb 6.9 oz (89.1 kg) (06/12 0400)  Hemodynamic parameters for last 24 hours:    Intake/Output from previous day: 06/11 0701 - 06/12 0700 In: 11414.7 [I.V.:10443.2; IV Piggyback:971.5] Out: [id number] [Urine:4675] Intake/Output this shift: Total I/O In: 1369 [I.V.:1207; NG/GT:62; IV Piggyback:100] Out: 1600 [Urine:1600]  Coarse breath sounds - bilateral wheezing  Lab Results:  Recent Labs  03/25/16 0440 03/26/16 0448  WBC 21.3* 22.4*  HGB 11.3* 11.4*  HCT 36.5* 36.6*  PLT 145* 107*   BMET:  Recent Labs  03/25/16 2035 03/26/16 0448  NA 147* 151*  K 3.0* 3.3*  CL 119* 120*  CO2 21* 22  GLUCOSE 111* 113*  BUN 33* 29*  CREATININE 1.28* 1.23  CALCIUM 10.9* 10.2    PT/INR: No results for input(s): LABPROT, INR in the last 72 hours. ABG    Component Value Date/Time   PHART 7.397 03/24/2016 0357   HCO3 28.6* 03/24/2016 0357   TCO2 30 03/24/2016 0357   O2SAT 98.0 03/24/2016 0357   CBG (last 3)   Recent Labs  03/26/16 0837 03/26/16 1123 03/26/16 1608  GLUCAP 108* 107* 160*    Assessment/Plan: S/P Procedure(s) (LRB): VIDEO  BRONCHOSCOPY WITH ENDOBRONCHIAL ULTRASOUND (N/A) Plan E BUS mediastinal biopsy later this week when patient's overall medical condition is  improved   LOS: 4 days    Tharon Aquas Trigt III 03/26/2016

## 2016-03-27 ENCOUNTER — Telehealth: Payer: Self-pay | Admitting: Internal Medicine

## 2016-03-27 ENCOUNTER — Encounter (HOSPITAL_COMMUNITY): Admission: EM | Disposition: E | Payer: Self-pay | Source: Home / Self Care | Attending: Pulmonary Disease

## 2016-03-27 LAB — CBC
HEMATOCRIT: 34.6 % — AB (ref 39.0–52.0)
HEMOGLOBIN: 10.6 g/dL — AB (ref 13.0–17.0)
MCH: 28.9 pg (ref 26.0–34.0)
MCHC: 30.6 g/dL (ref 30.0–36.0)
MCV: 94.3 fL (ref 78.0–100.0)
Platelets: 66 10*3/uL — ABNORMAL LOW (ref 150–400)
RBC: 3.67 MIL/uL — AB (ref 4.22–5.81)
RDW: 15 % (ref 11.5–15.5)
WBC: 19.3 10*3/uL — AB (ref 4.0–10.5)

## 2016-03-27 LAB — MULTIPLE MYELOMA PANEL, SERUM
ALBUMIN/GLOB SERPL: 0.8 (ref 0.7–1.7)
ALPHA 1: 0.4 g/dL (ref 0.0–0.4)
ALPHA2 GLOB SERPL ELPH-MCNC: 1.3 g/dL — AB (ref 0.4–1.0)
Albumin SerPl Elph-Mcnc: 2.8 g/dL — ABNORMAL LOW (ref 2.9–4.4)
B-GLOBULIN SERPL ELPH-MCNC: 1.3 g/dL (ref 0.7–1.3)
Gamma Glob SerPl Elph-Mcnc: 1 g/dL (ref 0.4–1.8)
Globulin, Total: 4 g/dL — ABNORMAL HIGH (ref 2.2–3.9)
IGG (IMMUNOGLOBIN G), SERUM: 1097 mg/dL (ref 700–1600)
IGM, SERUM: 87 mg/dL (ref 20–172)
IgA: 456 mg/dL — ABNORMAL HIGH (ref 90–386)
TOTAL PROTEIN ELP: 6.8 g/dL (ref 6.0–8.5)

## 2016-03-27 LAB — GLUCOSE, CAPILLARY
GLUCOSE-CAPILLARY: 126 mg/dL — AB (ref 65–99)
GLUCOSE-CAPILLARY: 194 mg/dL — AB (ref 65–99)
GLUCOSE-CAPILLARY: 214 mg/dL — AB (ref 65–99)
Glucose-Capillary: 168 mg/dL — ABNORMAL HIGH (ref 65–99)
Glucose-Capillary: 158 mg/dL — ABNORMAL HIGH (ref 65–99)
Glucose-Capillary: 148 mg/dL — ABNORMAL HIGH (ref 65–99)

## 2016-03-27 LAB — BASIC METABOLIC PANEL
ANION GAP: 6 (ref 5–15)
BUN: 35 mg/dL — ABNORMAL HIGH (ref 6–20)
CO2: 21 mmol/L — AB (ref 22–32)
Calcium: 8.9 mg/dL (ref 8.9–10.3)
Chloride: 121 mmol/L — ABNORMAL HIGH (ref 101–111)
Creatinine, Ser: 1.24 mg/dL (ref 0.61–1.24)
GFR calc Af Amer: >60 mL/min (ref >60)
GFR calc non Af Amer: >60 mL/min (ref >60)
GLUCOSE: 196 mg/dL — AB (ref 65–99)
POTASSIUM: 3.3 mmol/L — AB (ref 3.5–5.1)
Sodium: 148 mmol/L — ABNORMAL HIGH (ref 135–145)

## 2016-03-27 LAB — CULTURE, BLOOD (ROUTINE X 2)
CULTURE: NO GROWTH
Culture: NO GROWTH

## 2016-03-27 LAB — PTH-RELATED PEPTIDE

## 2016-03-27 LAB — MAGNESIUM: Magnesium: 1.9 mg/dL (ref 1.7–2.4)

## 2016-03-27 LAB — TRIGLYCERIDES: Triglycerides: 219 mg/dL — ABNORMAL HIGH (ref <150)

## 2016-03-27 LAB — CALCITRIOL (1,25 DI-OH VIT D): VIT D 1 25 DIHYDROXY: 9.8 pg/mL — AB (ref 19.9–79.3)

## 2016-03-27 LAB — PHOSPHORUS: Phosphorus: 1.7 mg/dL — ABNORMAL LOW (ref 2.5–4.6)

## 2016-03-27 SURGERY — BRONCHOSCOPY, WITH EBUS
Anesthesia: General

## 2016-03-27 MED ORDER — INSULIN ASPART 100 UNIT/ML ~~LOC~~ SOLN
0.0000 [IU] | SUBCUTANEOUS | Status: DC
  Administered 2016-03-27: 1 [IU] via SUBCUTANEOUS
  Administered 2016-03-27: 2 [IU] via SUBCUTANEOUS
  Administered 2016-03-27: 1 [IU] via SUBCUTANEOUS
  Administered 2016-03-27: 2 [IU] via SUBCUTANEOUS
  Administered 2016-03-28 – 2016-03-30 (×9): 1 [IU] via SUBCUTANEOUS

## 2016-03-27 MED ORDER — POTASSIUM CHLORIDE 20 MEQ/15ML (10%) PO SOLN
20.0000 meq | ORAL | Status: AC
  Administered 2016-03-27 (×2): 20 meq
  Filled 2016-03-27 (×2): qty 15

## 2016-03-27 MED ORDER — VANCOMYCIN HCL 10 G IV SOLR
1250.0000 mg | Freq: Two times a day (BID) | INTRAVENOUS | Status: DC
  Administered 2016-03-28: 1250 mg via INTRAVENOUS
  Filled 2016-03-27 (×2): qty 1250

## 2016-03-27 MED ORDER — VANCOMYCIN HCL 10 G IV SOLR
2000.0000 mg | Freq: Once | INTRAVENOUS | Status: AC
  Administered 2016-03-27: 2000 mg via INTRAVENOUS
  Filled 2016-03-27: qty 2000

## 2016-03-27 MED ORDER — MIDAZOLAM HCL 2 MG/2ML IJ SOLN
2.0000 mg | INTRAMUSCULAR | Status: DC | PRN
  Administered 2016-03-27 – 2016-03-29 (×6): 2 mg via INTRAVENOUS
  Filled 2016-03-27 (×7): qty 2

## 2016-03-27 MED ORDER — FREE WATER
300.0000 mL | Status: DC
  Administered 2016-03-27 – 2016-03-30 (×16): 300 mL

## 2016-03-27 NOTE — Progress Notes (Signed)
Pharmacy Antibiotic Note  Naseem Stephan is a 60 y.o. male admitted on 03/26/2016 with pneumonia.  Pharmacy has been consulted for vancomycin/Zosyn dosing.  WBC down to 19.3, Tmax 101F. Also on Day 6 of Zosyn. Previously on vancomycin stopped on 6/10, now restarting d/t low grade fever and new infiltrates on CXR.  Plan: Vancomycin 2000 mg x 1 loading dose Vancomycin 1250 mg q12h VT at steady state Monitor renal function F/u cx, s/sx clinical improvement  Height: 5\' 8"  (172.7 cm) Weight: 201 lb 8 oz (91.4 kg) IBW/kg (Calculated) : 68.4  Temp (24hrs), Avg:101.2 F (38.4 C), Min:98.5 F (36.9 C), Max:103 F (39.4 C)   Recent Labs Lab 03/16/2016 1222 03/16/2016 1840 03/25/2016 2142 03/23/16 0039 03/23/16 0705  03/24/16 0207  03/25/16 0440 03/25/16 0700 03/25/16 1826 03/25/16 2035 03/26/16 0448 03/18/2016 0430  WBC  --   --   --   --  23.9*  --  18.4*  --  21.3*  --   --   --  22.4* 19.3*  CREATININE  --  1.52*  --   --  1.54*  < >  --   < >  --  1.44* 1.38* 1.28* 1.23 1.24  LATICACIDVEN 2.81* 2.6* 2.9* 2.9* 2.9*  --   --   --   --   --   --   --   --   --   < > = values in this interval not displayed.  Estimated Creatinine Clearance: 70.4 mL/min (by C-G formula based on Cr of 1.24).    Allergies  Allergen Reactions  . Lisinopril Cough  . Tramadol Other (See Comments)    Insomnia    Antimicrobials this admission: 6/8 Vanc >>6/10, 6/13 >> 6/8 Zosyn >>  Dose adjustments this admission: n/a  Microbiology results: 6/12 blood >> 6/8 BCx2>>NGTD 6/8 UCx>> NEG 6/8 MRSA - NEG  Thank you for allowing pharmacy to be a part of this patient's care.  Wynelle Fanny 03/20/2016 1:23 PM

## 2016-03-27 NOTE — Progress Notes (Signed)
Pt still wearing wedding band.  Removed by Probation officer, sent home with pts wife.

## 2016-03-27 NOTE — Progress Notes (Signed)
[facility] ROUNDING NOTE   Subjective:   Interval History:  No changes in condition  Probable bronchoscopy today   Objective:  Vital signs in last 24 hours:  Temp:  [98.5 F (36.9 C)-103 F (39.4 C)] 101 F (38.3 C) (06/13 1200) Pulse Rate:  [86-132] 101 (06/13 1200) Resp:  [18-47] 21 (06/13 1200) BP: (108-176)/(66-96) 120/67 mmHg (06/13 1200) SpO2:  [95 %-99 %] 96 % (06/13 1200) FiO2 (%):  [40 %] 40 % (06/13 1143) Weight:  [91.4 kg (201 lb 8 oz)] 91.4 kg (201 lb 8 oz) (06/13 0500)  Weight change: 2.3 kg (5 lb 1.1 oz) Filed Weights   03/25/16 0330 03/26/16 0400 04/13/2016 0500  Weight: 86 kg (189 lb 9.5 oz) 89.1 kg (196 lb 6.9 oz) 91.4 kg (201 lb 8 oz)    Intake/Output: I/O last 3 completed shifts: In: 8996.9 [I.V.:6929.9; NG/GT:1092; IV Piggyback:975] Out: 4360 [Urine:4360]   Intake/Output this shift:  Total I/O In: 947.8 [I.V.:692.8; NG/GT:205; IV Piggyback:50] Out: 450 [Urine:450]  CVS- RRR RS- CTA ABD- BS present soft non-distended EXT- no edema   Basic Metabolic Panel:  Recent Labs Lab 03/24/16 1219 03/24/16 1815  03/25/16 0440 03/25/16 0700 03/25/16 1431 03/25/16 1826 03/25/16 2017 03/25/16 2035 03/26/16 0448 03/28/2016 0430  NA  --   --   < >  --  149*  --  148*  --  147* 151* 148*  K  --   --   < >  --  3.8  --  2.9*  --  3.0* 3.3* 3.3*  CL  --   --   < >  --  116*  --  115*  --  119* 120* 121*  CO2  --   --   < >  --  24  --  21*  --  21* 22 21*  GLUCOSE  --   --   < >  --  114*  --  115*  --  111* 113* 196*  BUN  --   --   < >  --  39*  --  34*  --  33* 29* 35*  CREATININE  --   --   < >  --  1.44*  --  1.38*  --  1.28* 1.23 1.24  CALCIUM  --   --   < >  --  12.1*  --  10.8*  --  10.9* 10.2 8.9  MG 1.4* 1.3*  --  1.3*  --  2.5*  --  2.2  --  1.7 1.9  PHOS 3.6 2.4*  --  2.1*  --  1.6*  --   --   --   --  1.7*  < > = values in this interval not displayed.  Liver Function Tests:  Recent Labs Lab 04/03/2016 0809 03/23/16 1136   AST 27 23  ALT 19 20  ALKPHOS 113 89  BILITOT 0.8 0.7  PROT 7.6 7.0  ALBUMIN 3.2* 2.7*   No results for input(s): LIPASE, AMYLASE in the last 168 hours.  Recent Labs Lab 03/20/2016 0845  AMMONIA 26    CBC:  Recent Labs Lab 04/10/2016 0809 03/23/16 0705 03/24/16 0207 03/25/16 0440 03/26/16 0448 04/10/2016 0430  WBC 24.9* 23.9* 18.4* 21.3* 22.4* 19.3*  NEUTROABS 18.4*  --   --   --   --   --   HGB 15.3 13.5 12.1* 11.3* 11.4* 10.6*  HCT 45.9 41.9 38.9* 36.5* 36.6* 34.6*  MCV 91.4 92.1 94.4 95.8  94.1 94.3  PLT 232 222 172 145* 107* 66*    Cardiac Enzymes:  Recent Labs Lab 03/23/16 1453 03/23/16 1940 03/24/16 0207  TROPONINI 0.06* 0.06* 0.06*    BNP: Invalid input(s): POCBNP  CBG:  Recent Labs Lab 03/26/16 2030 03/28/2016 0024 03/31/2016 0443 04/09/2016 0839 04/07/2016 1139  GLUCAP 168* 214* 194* 168* 158*    Microbiology: Results for orders placed or performed during the hospital encounter of 03/21/2016  Blood Culture (routine x 2)     Status: None (Preliminary result)   Collection Time: 04/13/2016  8:05 AM  Result Value Ref Range Status   Specimen Description BLOOD LEFT HAND  Final   Special Requests BOTTLES DRAWN AEROBIC AND ANAEROBIC 5CC  Final   Culture NO GROWTH 4 DAYS  Final   Report Status PENDING  Incomplete  Blood Culture (routine x 2)     Status: None (Preliminary result)   Collection Time: 03/21/2016  8:06 AM  Result Value Ref Range Status   Specimen Description BLOOD RIGHT ANTECUBITAL  Final   Special Requests BOTTLES DRAWN AEROBIC AND ANAEROBIC 10CC  Final   Culture NO GROWTH 4 DAYS  Final   Report Status PENDING  Incomplete  Urine culture     Status: None   Collection Time: 03/21/2016  9:22 AM  Result Value Ref Range Status   Specimen Description URINE, CATHETERIZED  Final   Special Requests NONE  Final   Culture NO GROWTH  Final   Report Status 03/23/2016 FINAL  Final  MRSA PCR Screening     Status: None   Collection Time: 04/12/2016  7:33 PM   Result Value Ref Range Status   MRSA by PCR NEGATIVE NEGATIVE Final    Comment:        The GeneXpert MRSA Assay (FDA approved for NASAL specimens only), is one component of a comprehensive MRSA colonization surveillance program. It is not intended to diagnose MRSA infection nor to guide or monitor treatment for MRSA infections.   Culture, blood (Routine X 2) w Reflex to ID Panel     Status: None (Preliminary result)   Collection Time: 03/26/16  7:37 PM  Result Value Ref Range Status   Specimen Description BLOOD LEFT ANTECUBITAL  Final   Special Requests BOTTLES DRAWN AEROBIC AND ANAEROBIC 10CC  Final   Culture PENDING  Incomplete   Report Status PENDING  Incomplete  Culture, blood (Routine X 2) w Reflex to ID Panel     Status: None (Preliminary result)   Collection Time: 03/26/16  7:38 PM  Result Value Ref Range Status   Specimen Description BLOOD RIGHT ANTECUBITAL  Final   Special Requests BOTTLES DRAWN AEROBIC AND ANAEROBIC 5CC  Final   Culture PENDING  Incomplete   Report Status PENDING  Incomplete    Coagulation Studies: No results for input(s): LABPROT, INR in the last 72 hours.  Urinalysis: No results for input(s): COLORURINE, LABSPEC, PHURINE, GLUCOSEU, HGBUR, BILIRUBINUR, KETONESUR, PROTEINUR, UROBILINOGEN, NITRITE, LEUKOCYTESUR in the last 72 hours.  Invalid input(s): APPERANCEUR    Imaging: Dg Chest Port 1 View  03/26/2016  CLINICAL DATA:  Respiratory failure. EXAM: PORTABLE CHEST 1 VIEW COMPARISON:  03/24/2016. FINDINGS: Endotracheal tube 3.5 cm above the carina. Right IJ line in stable position. Interim removal of NG tube. Heart size stable. New onset bilateral lower lobe infiltrates and or edema noted. Small left pleural effusion. Heart size stable. No pneumothorax. IMPRESSION: 1. Endotracheal tube 3.5 cm above the carina. Right IJ line in stable position. Interim removal  of NG tube. 2. New onset bilateral lower lobe infiltrates and/or edema, left side greater  than right. Small left pleural effusion. Electronically Signed   By: Marcello Moores  Register   On: 03/26/2016 06:58   Dg Abd Portable 1v  03/26/2016  CLINICAL DATA:  Feeding tube placement EXAM: PORTABLE ABDOMEN - 1 VIEW COMPARISON:  11/03/2013 FINDINGS: Feeding tube tip is positioned in the distal stomach, likely at the pylorus in the tip is directed distally towards the duodenum. Bowel gas pattern is nonspecific. IMPRESSION: Feeding tube tip is in the distal stomach near the pylorus. Electronically Signed   By: Misty Stanley M.D.   On: 03/26/2016 13:49     Medications:   . feeding supplement (VITAL 1.5 CAL) 1,000 mL (03/17/2016 0539)  . propofol (DIPRIVAN) infusion 30 mcg/kg/min (03/26/2016 0747)   . amLODipine  10 mg Oral Daily  . antiseptic oral rinse  7 mL Mouth Rinse QID  . chlorhexidine gluconate (SAGE KIT)  15 mL Mouth Rinse BID  . feeding supplement (PRO-STAT SUGAR FREE 64)  30 mL Per Tube BID  . free water  300 mL Per Tube Q4H  . insulin aspart  0-9 Units Subcutaneous Q4H  . metoprolol tartrate  12.5 mg Per Tube BID  . pantoprazole sodium  40 mg Per Tube Daily  . piperacillin-tazobactam (ZOSYN)  IV  3.375 g Intravenous Q8H  . sodium chloride flush  3 mL Intravenous Q12H   acetaminophen **OR** acetaminophen, bisacodyl, hydrALAZINE, ondansetron **OR** ondansetron (ZOFRAN) IV, polyethylene glycol  Assessment/ Plan:  1. Hypercalcemia, Severe; likely malignant. Improving; Suppressed PTH 1. Agree with calcitonin, bisphosphonate, hydration 2. No need for HD 2. Likely malignancy, LAN in chest, for LN Bx by bronch next week with thoracic surgery 3. HTN 4. CKD3 5. Hypokalemia Replete 6. Hypernatremia will need free water  Plan 1. Aggressive hydration, recommend replacing free water  2. PTHrp and 1,25 Vit D pending, nl Phos leans towards 1,25 Vit D process 3. Will let calcitonin stop now, 72h therapy completed  Calcium better will sign off and will have critical care manage K and Na  If  OK   No emerging renal issues    LOS: 5 Kaileb Monsanto W _0 _1 :12 PM

## 2016-03-27 NOTE — Progress Notes (Signed)
Inpatient Diabetes Program Recommendations  AACE/ADA: New Consensus Statement on Inpatient Glycemic Control (2015)  Target Ranges:  Prepandial:   less than 140 mg/dL      Peak postprandial:   less than 180 mg/dL (1-2 hours)      Critically ill patients:  140 - 180 mg/dL   Review of Glycemic Control  Diabetes history: None Current orders for Inpatient glycemic control: Novolog Sensitive Q4hrs  Inpatient Diabetes Program Recommendations: Correction (SSI): Consider discontinuing current glycemic control orders and initiate ICU Glycemic Control Order Set Phase 1 SQ insulin (protocol allows RN to transition patient from SQ to IV insulin if needed for glucose control in the critical care setting).   Thanks,  Tama Headings RN, MSN, [facility] Inpatient Diabetes Coordinator Team Pager [phone] (8a-5p)

## 2016-03-27 NOTE — Progress Notes (Signed)
eLink Physician-Brief Progress Note Patient Name: [name] DOB: 1955/12/07 MRN: [id number]   Date of Service  04/07/2016  HPI/Events of Note  Blood glucose = 196. Enteral feeds, however, no SSI ordered.   eICU Interventions  Will order Q 4 hour sensitive Novolog SSI.     Intervention Category Major Interventions: Hyperglycemia - active titration of insulin therapy  Lysle Dingwall 04/11/2016, 5:55 AM

## 2016-03-27 NOTE — Progress Notes (Signed)
[facility] ADULT ICU REPLACEMENT PROTOCOL FOR AM LAB REPLACEMENT ONLY  The patient does apply for the [facility] Adult ICU Electrolyte Replacment Protocol based on the criteria listed below:   1. Is GFR >/= 40 ml/min? Yes.    Patient's GFR today is >60 2. Is urine output >/= 0.5 ml/kg/hr for the last 6 hours? Yes.   Patient's UOP is 1.2 ml/kg/hr 3. Is BUN < 60 mg/dL? Yes.    Patient's BUN today is 35 4. Abnormal electrolyte(s):Potassium 5. Ordered repletion with: Potassium per protocol 6. If a panic level lab has been reported, has the CCM MD in charge been notified? No..   Physician:    Adam Phenix 04/05/2016 5:17 AM

## 2016-03-27 NOTE — Progress Notes (Signed)
PULMONARY / CRITICAL CARE MEDICINE   Name: [name] MRN: [id number] DOB: [birth year]    ADMISSION DATE:  04/11/2016 CONSULTATION DATE:  03/23/16  CHIEF COMPLAINT:  Altered mental status  HISTORY OF PRESENT ILLNESS:   60yo male smoker (40 pack years) with hx COPD, CKD 3, HTN, dCHF, anxiety, GERD recently treated for ?PNA, presented 6/8 with SIRS and progressive AMS.  Found to have mediastinal adenopathy and significant refractory hypercalcemia.  Initially admitted by [facility] to SDU but had progression of AMS requiring intubation 6/9 and tx ICU.     SUBJECTIVE:  Tachypnea, tachcardia    VITAL SIGNS: BP 116/78 mmHg  Pulse 101  Temp(Src) 101.8 F (38.8 C) (Oral)  Resp 47  Ht 5\' 8"  (1.727 m)  Wt 201 lb 8 oz (91.4 kg)  BMI 30.65 kg/m2  SpO2 95%  HEMODYNAMICS:    VENTILATOR SETTINGS: Vent Mode:  [-] PRVC FiO2 (%):  [40 %] 40 % Set Rate:  [16 bmp] 16 bmp Vt Set:  [550 mL] 550 mL PEEP:  [5 cmH20] 5 cmH20 Plateau Pressure:  [16 cmH20-30 cmH20] 16 cmH20  INTAKE / OUTPUT: I/O last 3 completed shifts: In: 8996.9 [I.V.:6929.9; NG/GT:1092; IV Piggyback:975] Out: 4360 [Urine:4360]  PHYSICAL EXAMINATION: General:  On vent Neuro:  Sedated on vent HEENT:  Dry mucus membranes, ETT in place Cardiovascular:  RRR, no mgr Lungs:  CTA B, non labored on full vent support Abdomen:  Soft, nontender Musculoskeletal: no bony abnormalities Skin:  No rashes  LABS:  BMET  Recent Labs Lab 03/25/16 2035 03/26/16 0448 03/15/2016 0430  NA 147* 151* 148*  K 3.0* 3.3* 3.3*  CL 119* 120* 121*  CO2 21* 22 21*  BUN 33* 29* 35*  CREATININE 1.28* 1.23 1.24  GLUCOSE 111* 113* 196*    Electrolytes  Recent Labs Lab 03/25/16 0440  03/25/16 1431  03/25/16 2017 03/25/16 2035 03/26/16 0448 04/06/2016 0430  CALCIUM  --   < >  --   < >  --  10.9* 10.2 8.9  MG 1.3*  --  2.5*  --  2.2  --  1.7 1.9  PHOS 2.1*  --  1.6*  --   --   --   --  1.7*  < > = values in this interval not  displayed.  CBC  Recent Labs Lab 03/25/16 0440 03/26/16 0448 04/09/2016 0430  WBC 21.3* 22.4* 19.3*  HGB 11.3* 11.4* 10.6*  HCT 36.5* 36.6* 34.6*  PLT 145* 107* 66*    Coag's  Recent Labs Lab 03/19/2016 1204  APTT 38*  INR 1.17    Sepsis Markers  Recent Labs Lab 03/29/2016 1204  04/06/2016 2142 03/23/16 0039 03/23/16 0705  LATICACIDVEN  --   < > 2.9* 2.9* 2.9*  PROCALCITON 0.37  --   --   --   --   < > = values in this interval not displayed.  ABG  Recent Labs Lab 03/24/16 0357  PHART 7.397  PCO2ART 46.6*  PO2ART 104.0*    Liver Enzymes  Recent Labs Lab 03/31/2016 0809 03/23/16 1136  AST 27 23  ALT 19 20  ALKPHOS 113 89  BILITOT 0.8 0.7  ALBUMIN 3.2* 2.7*    Cardiac Enzymes  Recent Labs Lab 03/23/16 1453 03/23/16 1940 03/24/16 0207  TROPONINI 0.06* 0.06* 0.06*    Glucose  Recent Labs Lab 03/26/16 1123 03/26/16 1608 03/26/16 2030 03/28/2016 0024 04/07/2016 0443 04/07/2016 0839  GLUCAP 107* 160* 168* 214* 194* 168*    Imaging Dg Abd Portable  1v  03/26/2016  CLINICAL DATA:  Feeding tube placement EXAM: PORTABLE ABDOMEN - 1 VIEW COMPARISON:  11/03/2013 FINDINGS: Feeding tube tip is positioned in the distal stomach, likely at the pylorus in the tip is directed distally towards the duodenum. Bowel gas pattern is nonspecific. IMPRESSION: Feeding tube tip is in the distal stomach near the pylorus. Electronically Signed   By: Misty Stanley M.D.   On: 03/26/2016 13:49    ABX:  Vanc 6/9>>> 6/10, 6/13 >>  Zosyn 6/9>>>  MICRO:  BC x 2 6/9>>> Urine 6/9>>>neg  LINES/TUBES: ETT 6/10>>> R IJ HD cath 6/10>>> remove 6/11 PICC    DISCUSSION: [name] is a 60 y/o man with mediastinal adenopathy, hypercalcemia and AMS.  Fever and leukocytosis without source of infection.   ASSESSMENT / PLAN:  PULMONARY A: Acute respiratory failure -- r/t AMS  P:   Daily SBT  PS wean - mental status does not yet support extubation, attempt to lighten  propofol For bronch with bx on 6/15 F/u CXR  Plan for tube change to allow fob/ebus in future  CARDIOVASCULAR A:  HTN CHF Tachycardia  P:  continue home amlodipine for now  metoprolol to 12.5mg  BID per tube  Monitor   RENAL  Recent Labs Lab 03/25/16 2035 03/26/16 0448 03/22/2016 0430  NA 147* 151* 148*   Lab Results  Component Value Date   CREATININE 1.24 04/02/2016   CREATININE 1.23 03/26/2016   CREATININE 1.28* 03/25/2016    A:   Severe Hypercalcemia > improving, likely due to malignancy or sarcoidosis Acute renal failure CKD 3 Hypernatremia  P:   Favor malignancy as likely source Calcitonin given Appreciate renal assistance Monitor BMET and UOP Replace electrolytes as needed Increase free water 6/13 D/c D5w on 6/13 and follow Ca++  GASTROINTESTINAL A:   No active issues P:   TF per nutrition   HEMATOLOGIC A:   Mediastinal adenopathy, likely malignancy Thrombocytopenia  P:  Plan for EBUS on 6/15 at 08:00 Stop enoxaparin and check HITT panel, consider DIC panel  INFECTIOUS A:   SIRS  Fever > unclear source, line? May also be due to lymphoma or other inflammatory process Doubt HCAP P:   F/u cultures > negative thus far Add empiric vanco on 6/13 to existing pip/tazo  ENDOCRINE A:   Hypercalcemia 10.9->10.2-> 8.9 P:   SSI D/c D5W 6/13  NEUROLOGIC A:   Metabolic encephalopathy due to hypercalecmia  Need for sedation P:   RASS goal: 0 Propofol - wean as able, likely large contributor to current MS Management of hyperCa   FAMILY  - Updates: wife updated at bedside 6/12  - Inter-disciplinary family meet or Palliative Care meeting due by: 6/17  [name] ACNP [facility] PCCM Pager [phone] till 3 pm If no answer page [phone] 03/30/2016, 11:02 AM   Attending Note:  I have examined patient, reviewed labs, studies and notes. I have discussed the case with S Marshall, and I agree with the data and plans as amended above. 60 yo man,  admitted with altered MS and hypercalcemia requiring airway protection. He has been febrile on empiric abx, all cx negative to date. Note mediastinal LAD concerning for malignancy. On my eval he is sedated, intubated. He had a fever this am. Breath sounds coarse. 2+ edema to thighs. He has evolving thrombocytopenia, ? Due to meds or Ab mediated. will d/c enoxaparin, check HITT panel. Add empiric vanco to existing zosyn today given continued fever. We will plan for EBUS in  OR on Thursday. He will need an ETT exchange to larger tube in order to proceed. Independent critical care time is 38 minutes.   Baltazar Apo, MD, PhD 04/11/2016, 12:41 PM [facility] [phone] or if no answer [phone]

## 2016-03-28 ENCOUNTER — Inpatient Hospital Stay (HOSPITAL_COMMUNITY): Payer: BLUE CROSS/BLUE SHIELD

## 2016-03-28 DIAGNOSIS — D649 Anemia, unspecified: Secondary | ICD-10-CM

## 2016-03-28 DIAGNOSIS — D696 Thrombocytopenia, unspecified: Secondary | ICD-10-CM

## 2016-03-28 LAB — CBC WITH DIFFERENTIAL/PLATELET
BASOS PCT: 1 %
Basophils Absolute: 0.1 10*3/uL (ref 0.0–0.1)
EOS PCT: 9 %
Eosinophils Absolute: 1.3 10*3/uL — ABNORMAL HIGH (ref 0.0–0.7)
HEMATOCRIT: 31.8 % — AB (ref 39.0–52.0)
HEMOGLOBIN: 9.8 g/dL — AB (ref 13.0–17.0)
LYMPHS PCT: 13 %
Lymphs Abs: 1.8 10*3/uL (ref 0.7–4.0)
MCH: 29.7 pg (ref 26.0–34.0)
MCHC: 30.8 g/dL (ref 30.0–36.0)
MCV: 96.4 fL (ref 78.0–100.0)
MONOS PCT: 11 %
Monocytes Absolute: 1.6 10*3/uL — ABNORMAL HIGH (ref 0.1–1.0)
NEUTROS PCT: 66 %
Neutro Abs: 9.3 10*3/uL — ABNORMAL HIGH (ref 1.7–7.7)
Platelets: 49 10*3/uL — ABNORMAL LOW (ref 150–400)
RBC: 3.3 MIL/uL — ABNORMAL LOW (ref 4.22–5.81)
RDW: 15.5 % (ref 11.5–15.5)
WBC: 14.1 10*3/uL — ABNORMAL HIGH (ref 4.0–10.5)

## 2016-03-28 LAB — GLUCOSE, CAPILLARY
Glucose-Capillary: 135 mg/dL — ABNORMAL HIGH (ref 65–99)
Glucose-Capillary: 132 mg/dL — ABNORMAL HIGH (ref 65–99)
Glucose-Capillary: 133 mg/dL — ABNORMAL HIGH (ref 65–99)
Glucose-Capillary: 131 mg/dL — ABNORMAL HIGH (ref 65–99)
Glucose-Capillary: 146 mg/dL — ABNORMAL HIGH (ref 65–99)

## 2016-03-28 LAB — CBC
HCT: 32.7 % — ABNORMAL LOW (ref 39.0–52.0)
Hemoglobin: 9.9 g/dL — ABNORMAL LOW (ref 13.0–17.0)
MCH: 28.7 pg (ref 26.0–34.0)
MCHC: 30.3 g/dL (ref 30.0–36.0)
MCV: 94.8 fL (ref 78.0–100.0)
PLATELETS: 49 10*3/uL — AB (ref 150–400)
RBC: 3.45 MIL/uL — ABNORMAL LOW (ref 4.22–5.81)
RDW: 15.4 % (ref 11.5–15.5)
WBC: 15.2 10*3/uL — AB (ref 4.0–10.5)

## 2016-03-28 LAB — PROTIME-INR
INR: 1.48 (ref 0.00–1.49)
Prothrombin Time: 18 seconds — ABNORMAL HIGH (ref 11.6–15.2)

## 2016-03-28 LAB — POCT I-STAT 3, ART BLOOD GAS (G3+)
Acid-base deficit: 5 mmol/L — ABNORMAL HIGH (ref 0.0–2.0)
Acid-base deficit: 3 mmol/L — ABNORMAL HIGH (ref 0.0–2.0)
BICARBONATE: 20.1 meq/L (ref 20.0–24.0)
BICARBONATE: 22.6 meq/L (ref 20.0–24.0)
O2 SAT: 94 %
O2 Saturation: 100 %
PCO2 ART: 41.4 mmHg (ref 35.0–45.0)
PH ART: 7.345 — AB (ref 7.350–7.450)
TCO2: 21 mmol/L (ref 0–100)
TCO2: 24 mmol/L (ref 0–100)
pCO2 arterial: 36.1 mmHg (ref 35.0–45.0)
pH, Arterial: 7.355 (ref 7.350–7.450)
pO2, Arterial: 72 mmHg — ABNORMAL LOW (ref 80.0–100.0)
pO2, Arterial: 312 mmHg — ABNORMAL HIGH (ref 80.0–100.0)

## 2016-03-28 LAB — APTT: aPTT: 48 seconds — ABNORMAL HIGH (ref 24–37)

## 2016-03-28 LAB — BASIC METABOLIC PANEL
Anion gap: 6 (ref 5–15)
BUN: 55 mg/dL — AB (ref 6–20)
CALCIUM: 8.7 mg/dL — AB (ref 8.9–10.3)
CO2: 22 mmol/L (ref 22–32)
Chloride: 118 mmol/L — ABNORMAL HIGH (ref 101–111)
Creatinine, Ser: 1.72 mg/dL — ABNORMAL HIGH (ref 0.61–1.24)
GFR calc Af Amer: 48 mL/min — ABNORMAL LOW (ref >60)
GFR, EST NON AFRICAN AMERICAN: 42 mL/min — AB (ref >60)
GLUCOSE: 158 mg/dL — AB (ref 65–99)
Potassium: 4.4 mmol/L (ref 3.5–5.1)
Sodium: 146 mmol/L — ABNORMAL HIGH (ref 135–145)

## 2016-03-28 LAB — MAGNESIUM: MAGNESIUM: 1.9 mg/dL (ref 1.7–2.4)

## 2016-03-28 LAB — PHOSPHORUS: PHOSPHORUS: 3 mg/dL (ref 2.5–4.6)

## 2016-03-28 LAB — PROCALCITONIN: PROCALCITONIN: 3.68 ng/mL

## 2016-03-28 LAB — HEPARIN INDUCED PLATELET AB (HIT ANTIBODY): Heparin Induced Plt Ab: 0.148 OD (ref 0.000–0.400)

## 2016-03-28 LAB — SAVE SMEAR

## 2016-03-28 LAB — LACTIC ACID, PLASMA: LACTIC ACID, VENOUS: 1.5 mmol/L (ref 0.5–2.0)

## 2016-03-28 NOTE — Progress Notes (Signed)
[facility] Progress Note Patient Name: Luisalberto Heimberger DOB: Jun 29, 1956 MRN: [id number]   Date of Service  03/28/2016  HPI/Events of Note  Hypoxia - Sats decreased into the 80's. CXR >> Appliances appear in satisfactory position. Increasing infiltrates or atelectasis in the lung bases. ABG = 7.35/36.1/72.0 on 100% and PEEP = 5.  eICU Interventions  Will increase PEEP to 12.      Intervention Category Intermediate Interventions: Respiratory distress - evaluation and management  Sommer,Steven Eugene 03/28/2016, 3:56 AM

## 2016-03-28 NOTE — Progress Notes (Signed)
PULMONARY / CRITICAL CARE MEDICINE   Name: [name] MRN: [id number] DOB: Sep 16, 1956    ADMISSION DATE:  04/13/2016 CONSULTATION DATE:  03/23/16  CHIEF COMPLAINT:  Altered mental status  HISTORY OF PRESENT ILLNESS:   60yo male smoker (40 pack years) with hx COPD, CKD 3, HTN, dCHF, anxiety, GERD recently treated for ?PNA, presented 6/8 with SIRS and progressive AMS.  Found to have mediastinal adenopathy and significant refractory hypercalcemia.  Initially admitted by [facility] to SDU but had progression of AMS requiring intubation 6/9 and tx ICU.     SUBJECTIVE:  Tachypnea, tachcardia, cool left foot  VITAL SIGNS: BP 96/71 mmHg  Pulse 96  Temp(Src) 100.3 F (37.9 C) (Oral)  Resp 28  Ht 5\' 8"  (1.727 m)  Wt 203 lb 14.8 oz (92.5 kg)  BMI 31.01 kg/m2  SpO2 98%  HEMODYNAMICS:    VENTILATOR SETTINGS: Vent Mode:  [-] PRVC FiO2 (%):  [40 %-100 %] 70 % Set Rate:  [16 bmp] 16 bmp Vt Set:  [550 mL] 550 mL PEEP:  [5 cmH20-12 cmH20] 10 cmH20 Plateau Pressure:  [14 cmH20-24 cmH20] 24 cmH20  INTAKE / OUTPUT: I/O last 3 completed shifts: In: 6743.5 [I.V.:2863.5; NG/GT:2930; IV Piggyback:950] Out: 2690 [Urine:2690]  PHYSICAL EXAMINATION: General:  On vent, heavily sedated Neuro:  Sedated on vent HEENT:  Dry mucus membranes, ETT in place Cardiovascular:  RRR, no mgr Lungs:  CTA B, non labored on full vent support. Peep 12 fio2 100%. Plt pressures 27 Abdomen:  Soft, distended Musculoskeletal: no bony abnormalities Skin:  No rashes. Left foot cooler, +DP pulse with doppler  LABS:  BMET  Recent Labs Lab 03/26/16 0448 03/17/2016 0430 03/28/16 0304  NA 151* 148* 146*  K 3.3* 3.3* 4.4  CL 120* 121* 118*  CO2 22 21* 22  BUN 29* 35* 55*  CREATININE 1.23 1.24 1.72*  GLUCOSE 113* 196* 158*    Electrolytes  Recent Labs Lab 03/25/16 1431  03/26/16 0448 03/20/2016 0430 03/28/16 0304  CALCIUM  --   < > 10.2 8.9 8.7*  MG 2.5*  < > 1.7 1.9 1.9  PHOS 1.6*  --   --  1.7* 3.0  < >  = values in this interval not displayed.  CBC  Recent Labs Lab 03/26/16 0448 04/13/2016 0430 03/28/16 0304  WBC 22.4* 19.3* 15.2*  HGB 11.4* 10.6* 9.9*  HCT 36.6* 34.6* 32.7*  PLT 107* 66* 49*    Coag's  Recent Labs Lab 03/20/2016 1204  APTT 38*  INR 1.17    Sepsis Markers  Recent Labs Lab 03/15/2016 1204  04/12/2016 2142 03/23/16 0039 03/23/16 0705  LATICACIDVEN  --   < > 2.9* 2.9* 2.9*  PROCALCITON 0.37  --   --   --   --   < > = values in this interval not displayed.  ABG  Recent Labs Lab 03/24/16 0357 03/28/16 0252 03/28/16 0825  PHART 7.397 7.355 7.345*  PCO2ART 46.6* 36.1 41.4  PO2ART 104.0* 72.0* 312.0*    Liver Enzymes  Recent Labs Lab 04/08/2016 0809 03/23/16 1136  AST 27 23  ALT 19 20  ALKPHOS 113 89  BILITOT 0.8 0.7  ALBUMIN 3.2* 2.7*    Cardiac Enzymes  Recent Labs Lab 03/23/16 1453 03/23/16 1940 03/24/16 0207  TROPONINI 0.06* 0.06* 0.06*    Glucose  Recent Labs Lab 03/19/2016 0839 03/16/2016 1139 04/10/2016 1620 03/22/2016 1945 03/28/16 0418 03/28/16 0832  GLUCAP 168* 158* 126* 148* 135* 132*    Imaging Dg Chest Port 1  View  03/28/2016  CLINICAL DATA:  Respiratory failure. EXAM: PORTABLE CHEST 1 VIEW COMPARISON:  03/26/2016 FINDINGS: Endotracheal tube measures 5.7 cm above the carina. Right central venous catheter with tip over the low SVC region. No pneumothorax. Enteric tube tip is off the field of view but below the left hemidiaphragm. Shallow inspiration. Mild cardiac enlargement. Infiltrates or atelectasis in both lung bases, progressing on the right since the previous study. IMPRESSION: Appliances appear in satisfactory position. Increasing infiltrates or atelectasis in the lung bases. Electronically Signed   By: Lucienne Capers M.D.   On: 03/28/2016 02:59    ABX:  Vanc 6/9>>> 6/10, 6/13 >> 6/14 Zosyn 6/9>>>  MICRO:  BC x 2 6/9>>> Urine 6/9>>>neg  LINES/TUBES: ETT 6/10>>> R IJ HD cath 6/10>>> remove 6/11 PICC     DISCUSSION: Mr. Wolfinger is a 60 y/o man with mediastinal adenopathy, hypercalcemia and AMS.  Fever and leukocytosis without source of infection.   ASSESSMENT / PLAN:  PULMONARY A: Acute respiratory failure -- r/t AMS  Increasing B basilar pulm infiltrates > cardiac edema vs ARDS Bilateral mediastinal LAD P:   Increased FIO2 needs 6/14. Vent changes made, wean peep and FIO2 as tolerated Daily SBT when meets criteria Planning for EBUS with mediastinal LAD bx on 6/15 F/u CXR  Plan for tube change to allow fob/ebus to at least 8, larger would be preferred Place Aline for frequent abg's  CARDIOVASCULAR A:  HTN CHF Tachycardia  Left foot cooler than right 6/14 Volume overload P:  continue home amlodipine for now  metoprolol to 12.5mg  BID per tube  Arterial Doppler study ordered 6/14 May need vascular eval in future  RENAL  Recent Labs Lab 03/26/16 0448 04/10/2016 0430 03/28/16 0304  NA 151* 148* 146*   Lab Results  Component Value Date   CREATININE 1.72* 03/28/2016   CREATININE 1.24 04/03/2016   CREATININE 1.23 03/26/2016   A:   Severe Hypercalcemia > improving, likely due to malignancy or sarcoidosis Acute renal failure CKD 3 Hypernatremia   P:   Favor malignancy as likely source Calcitonin given Appreciate renal assistance Monitor BMET and UOP Replace electrolytes as needed Increased free water 6/13  GASTROINTESTINAL A:   No active issues P:   TF per nutrition   HEMATOLOGIC A:   Mediastinal adenopathy, likely malignancy Thrombocytopenia  P:  Plan for EBUS on 6/15 at 08:00, may be on hold due to increased fio2 needs Stopped enoxaparin, HITT panel pending, consider DIC panel. Discussed w Dr Benay Spice on 6/14 - will d/c vanco as a potential contributor to thrombocytopenia  INFECTIOUS A:   SIRS  Fever > unclear source, line? May also be due to lymphoma or other inflammatory process Doubt HCAP P:   F/u cultures > negative thus far Added  empiric vanco on 6/13 to existing pip/tazo,  vanc dc'd 6/14 per hemonc due to thrombocytopenia  ENDOCRINE A:   Hypercalcemia 10.9->10.2-> 8.9->8.7 P:   SSI D/c D5W 6/13  NEUROLOGIC A:   Metabolic encephalopathy due to hypercalecmia  Need for sedation P:   RASS goal: -1 for tube tolerance and vent synchrony  Propofol - wean as able, likely large contributor to current MS Management of hyperCa   FAMILY  - Updates: wife updated at bedside 6/13  - Inter-disciplinary family meet or Palliative Care meeting due by: 6/17  Richardson Landry Minor ACNP [facility] PCCM Pager [phone] till 3 pm If no answer page [phone] 03/28/2016, 9:43 AM   Attending Note:  I have examined patient, reviewed  labs, studies and notes. I have discussed the case with S Minor, and I agree with the data and plans as amended above.  60 yo man, admitted with altered MS and hypercalcemia requiring airway protection. He has been febrile on empiric abx, all cx negative to date. He has mediastinal LAD concerning for malignancy. On my eval he is sedated, intubated. Has had intermittent fevers. Breath sounds coarse. 2+ edema to thighs. He has evolving thrombocytopenia, ? Due to meds, malignancy or Ab mediated. Will stop vanco (added back on 6/13) given potential to exacerbate thrombocytopenia. Would like to take him to OR for EBUS on 6/15 depending on his PEEP and FiO2 needs.   Independent critical care time is 36 minutes.   Baltazar Apo, MD, PhD 03/28/2016, 2:55 PM [facility] [phone] or if no answer [phone]

## 2016-03-28 NOTE — Progress Notes (Addendum)
IP PROGRESS NOTE  Subjective:   He is sedated on the ventilator.  Objective: Vital signs in last 24 hours: Blood pressure 92/65, pulse 95, temperature 100.5 F (38.1 C), temperature source Oral, resp. rate 15, height 5\' 8"  (1.727 m), weight 203 lb 14.8 oz (92.5 kg), SpO2 95 %.  Intake/Output from previous day: 06/13 0701 - 06/14 0700 In: 4091.3 [I.V.:1141.3; NG/GT:2050; IV Piggyback:900] Out: 1855 [Urine:1855]  Physical Exam:  Cardiac: Distant heart sounds Lungs: Moves air bilaterally Abdomen: Soft, no hepatosplenomegaly Lymph nodes: No cervical, supraclavicular, axillary, or inguinal nodes    Lab Results:  Recent Labs  2016/09/14 0430 03/28/16 0304  WBC 19.3* 15.2*  HGB 10.6* 9.9*  HCT 34.6* 32.7*  PLT 66* 49*    BMET  Recent Labs  2016/09/14 0430 03/28/16 0304  NA 148* 146*  K 3.3* 4.4  CL 121* 118*  CO2 21* 22  GLUCOSE 196* 158*  BUN 35* 55*  CREATININE 1.24 1.72*  CALCIUM 8.9 8.7*    Studies/Results: Dg Chest Port 1 View  03/28/2016  CLINICAL DATA:  Respiratory failure. EXAM: PORTABLE CHEST 1 VIEW COMPARISON:  03/26/2016 FINDINGS: Endotracheal tube measures 5.7 cm above the carina. Right central venous catheter with tip over the low SVC region. No pneumothorax. Enteric tube tip is off the field of view but below the left hemidiaphragm. Shallow inspiration. Mild cardiac enlargement. Infiltrates or atelectasis in both lung bases, progressing on the right since the previous study. IMPRESSION: Appliances appear in satisfactory position. Increasing infiltrates or atelectasis in the lung bases. Electronically Signed   By: Burman NievesWilliam  Stevens M.D.   On: 03/28/2016 02:59   Dg Abd Portable 1v  03/26/2016  CLINICAL DATA:  Feeding tube placement EXAM: PORTABLE ABDOMEN - 1 VIEW COMPARISON:  11/03/2013 FINDINGS: Feeding tube tip is positioned in the distal stomach, likely at the pylorus in the tip is directed distally towards the duodenum. Bowel gas pattern is nonspecific.  IMPRESSION: Feeding tube tip is in the distal stomach near the pylorus. Electronically Signed   By: Kennith CenterEric  Mansell M.D.   On: 03/26/2016 13:49    Medications: I have reviewed the patient's current medications.  Assessment/Plan:  1. Hypercalcemia-Improved 2. Altered Mental status secondary to #1 3. Chest pain-likely secondary to a rib fracture and potentially bone metastases 4. COPD 5. Mediastinal lymphadenopathy 6. Leukocytosis-neutrophilia 7. Fever--most likely tumor fever, no source for infection has been identified 8. Respiratory failure secondary to altered mental status, status post intubation 03/23/2016  Persistent ventilatory dependent respiratory failure secondary to COPD, pneumonia? 9. Thrombocytopenia-developing in the hospital 10. Anemia-developing in the hospital, secondary to phlebotomy and critical illness  [name] continues to have ventilator-dependent respiratory failure. The hypercalcemia has resolved. He has a fever and neutrophilia. No source for infection has been identified and blood cultures remain negative. I suspect he has an underlying malignancy. A serum protein electrophoresis and immunofixation did not reveal a monoclonal protein. The differential diagnosis includes lymphoma and lung cancer.  He has developed thrombocytopenia over the past several days. This is most likely related to polypharmacy. I will repeat the PT and PTT and review the peripheral blood smear.  His family is not present this morning. I am available to discuss the case with them. Recommendations:  1. Continue management of respiratory failure per critical care medicine 2. Stop vancomycin 3. Proceed with a diagnostic mediastinal lymph node biopsy when appropriate per critical care and CVTS    LOS: 6 days   [name], Joseph Heasley, MD   03/28/2016, 8:03  AM  Review of the peripheral blood smear from 03/28/2016: The platelets are decreased in number. Several platelet clumps at the periphery of  one side of the blood smear. A few helmets and schistocytes. I saw 2 nucleated red cells. The majority of the white cells are mature neutrophils. There are scattered band forms and a few myelocytes. Increased eosinophils.  I doubt the thrombocytopenia is related to "pseudothrombocytopenia "from platelet clumping, but we will check a platelet count in a citrate tube.

## 2016-03-28 NOTE — Progress Notes (Signed)
Aline attempted by 2 RT's X4, unsuccessful.  MD made aware.

## 2016-03-28 NOTE — Progress Notes (Signed)
[facility] Progress Note Patient Name: [name] DOB: [birth year] MRN: [id number]   Date of Service  03/28/2016  HPI/Events of Note  Hypoxia and Dyssynchrony with ventilator. Already on Propofol IV infusion and Versed Q 1 hour PRN.   eICU Interventions  Will order: 1. Portable CXR and ABG now.     Intervention Category Major Interventions: Respiratory failure - evaluation and management  Sommer,Steven Eugene 03/28/2016, 2:43 AM

## 2016-03-28 NOTE — Progress Notes (Signed)
Paged MD in [place] regarding pts respiratory status.  Pt with persistent hypoxia despite various respiratory interventions. New vent settings ordered

## 2016-03-28 NOTE — Progress Notes (Signed)
[name] Minor, NP on floor and made aware of worsening cyanosis to pts left toes. Able to still doppler pulse. Also made aware during mouth care pts mouth had small amount of blood. Will continue to monitor.

## 2016-03-29 ENCOUNTER — Encounter (HOSPITAL_COMMUNITY): Admission: EM | Disposition: E | Payer: Self-pay | Source: Home / Self Care | Attending: Pulmonary Disease

## 2016-03-29 ENCOUNTER — Inpatient Hospital Stay (HOSPITAL_COMMUNITY): Payer: BLUE CROSS/BLUE SHIELD

## 2016-03-29 ENCOUNTER — Inpatient Hospital Stay (HOSPITAL_COMMUNITY): Payer: BLUE CROSS/BLUE SHIELD | Admitting: Anesthesiology

## 2016-03-29 ENCOUNTER — Encounter (HOSPITAL_COMMUNITY): Payer: Self-pay | Admitting: Emergency Medicine

## 2016-03-29 DIAGNOSIS — N19 Unspecified kidney failure: Secondary | ICD-10-CM

## 2016-03-29 DIAGNOSIS — J9690 Respiratory failure, unspecified, unspecified whether with hypoxia or hypercapnia: Secondary | ICD-10-CM | POA: Insufficient documentation

## 2016-03-29 DIAGNOSIS — D689 Coagulation defect, unspecified: Secondary | ICD-10-CM

## 2016-03-29 DIAGNOSIS — R599 Enlarged lymph nodes, unspecified: Secondary | ICD-10-CM

## 2016-03-29 HISTORY — PX: VIDEO BRONCHOSCOPY WITH ENDOBRONCHIAL ULTRASOUND: SHX6177

## 2016-03-29 LAB — CBC
HEMATOCRIT: 30.4 % — AB (ref 39.0–52.0)
HEMOGLOBIN: 9.4 g/dL — AB (ref 13.0–17.0)
MCH: 28.9 pg (ref 26.0–34.0)
MCHC: 30.9 g/dL (ref 30.0–36.0)
MCV: 93.5 fL (ref 78.0–100.0)
PLATELETS: 45 10*3/uL — AB (ref 150–400)
RBC: 3.25 MIL/uL — AB (ref 4.22–5.81)
RDW: 15.6 % — AB (ref 11.5–15.5)
WBC: 10.2 10*3/uL (ref 4.0–10.5)

## 2016-03-29 LAB — BASIC METABOLIC PANEL
ANION GAP: 12 (ref 5–15)
Anion gap: 14 (ref 5–15)
BUN: 83 mg/dL — ABNORMAL HIGH (ref 6–20)
BUN: 91 mg/dL — AB (ref 6–20)
CALCIUM: 8.5 mg/dL — AB (ref 8.9–10.3)
CHLORIDE: 109 mmol/L (ref 101–111)
CO2: 22 mmol/L (ref 22–32)
CO2: 18 mmol/L — ABNORMAL LOW (ref 22–32)
CREATININE: 2.41 mg/dL — AB (ref 0.61–1.24)
Calcium: 8.1 mg/dL — ABNORMAL LOW (ref 8.9–10.3)
Chloride: 110 mmol/L (ref 101–111)
Creatinine, Ser: 2.99 mg/dL — ABNORMAL HIGH (ref 0.61–1.24)
GFR calc Af Amer: 25 mL/min — ABNORMAL LOW (ref >60)
GFR calc non Af Amer: 21 mL/min — ABNORMAL LOW (ref >60)
GFR, EST AFRICAN AMERICAN: 32 mL/min — AB (ref >60)
GFR, EST NON AFRICAN AMERICAN: 28 mL/min — AB (ref >60)
GLUCOSE: 128 mg/dL — AB (ref 65–99)
Glucose, Bld: 134 mg/dL — ABNORMAL HIGH (ref 65–99)
POTASSIUM: 6.4 mmol/L — AB (ref 3.5–5.1)
Potassium: 5 mmol/L (ref 3.5–5.1)
SODIUM: 144 mmol/L (ref 135–145)
Sodium: 141 mmol/L (ref 135–145)

## 2016-03-29 LAB — POCT I-STAT 3, ART BLOOD GAS (G3+)
Acid-base deficit: 11 mmol/L — ABNORMAL HIGH (ref 0.0–2.0)
Acid-base deficit: 6 mmol/L — ABNORMAL HIGH (ref 0.0–2.0)
BICARBONATE: 20.6 meq/L (ref 20.0–24.0)
Bicarbonate: 20.8 mEq/L (ref 20.0–24.0)
O2 SAT: 73 %
O2 Saturation: 99 %
PCO2 ART: 80.4 mmHg — AB (ref 35.0–45.0)
PCO2 ART: 46.6 mmHg — AB (ref 35.0–45.0)
PH ART: 7.022 — AB (ref 7.350–7.450)
PO2 ART: 58 mmHg — AB (ref 80.0–100.0)
PO2 ART: 165 mmHg — AB (ref 80.0–100.0)
TCO2: 23 mmol/L (ref 0–100)
TCO2: 22 mmol/L (ref 0–100)
pH, Arterial: 7.253 — ABNORMAL LOW (ref 7.350–7.450)

## 2016-03-29 LAB — BLOOD GAS, ARTERIAL
Acid-base deficit: 4.9 mmol/L — ABNORMAL HIGH (ref 0.0–2.0)
BICARBONATE: 19.9 meq/L — AB (ref 20.0–24.0)
Drawn by: 40415
FIO2: 0.5
O2 Saturation: 96.9 %
PATIENT TEMPERATURE: 99.5
PCO2 ART: 38.8 mmHg (ref 35.0–45.0)
PEEP: 8 cmH2O
PO2 ART: 102 mmHg — AB (ref 80.0–100.0)
RATE: 16 resp/min
TCO2: 21.1 mmol/L (ref 0–100)
VT: 550 mL
pH, Arterial: 7.333 — ABNORMAL LOW (ref 7.350–7.450)

## 2016-03-29 LAB — LACTATE DEHYDROGENASE: LDH: 4103 U/L — AB (ref 98–192)

## 2016-03-29 LAB — HEPATIC FUNCTION PANEL
ALBUMIN: 1.5 g/dL — AB (ref 3.5–5.0)
ALT: 38 U/L (ref 17–63)
AST: 84 U/L — ABNORMAL HIGH (ref 15–41)
Alkaline Phosphatase: 251 U/L — ABNORMAL HIGH (ref 38–126)
BILIRUBIN DIRECT: 0.4 mg/dL (ref 0.1–0.5)
BILIRUBIN INDIRECT: 0.4 mg/dL (ref 0.3–0.9)
BILIRUBIN TOTAL: 0.8 mg/dL (ref 0.3–1.2)
Total Protein: 5.8 g/dL — ABNORMAL LOW (ref 6.5–8.1)

## 2016-03-29 LAB — MAGNESIUM: MAGNESIUM: 1.9 mg/dL (ref 1.7–2.4)

## 2016-03-29 LAB — PROCALCITONIN: PROCALCITONIN: 10.5 ng/mL

## 2016-03-29 LAB — GLUCOSE, CAPILLARY
GLUCOSE-CAPILLARY: 118 mg/dL — AB (ref 65–99)
GLUCOSE-CAPILLARY: 119 mg/dL — AB (ref 65–99)
GLUCOSE-CAPILLARY: 120 mg/dL — AB (ref 65–99)
GLUCOSE-CAPILLARY: 127 mg/dL — AB (ref 65–99)
Glucose-Capillary: 139 mg/dL — ABNORMAL HIGH (ref 65–99)
Glucose-Capillary: 116 mg/dL — ABNORMAL HIGH (ref 65–99)
Glucose-Capillary: 104 mg/dL — ABNORMAL HIGH (ref 65–99)

## 2016-03-29 LAB — RETICULOCYTES
RBC.: 3.69 MIL/uL — AB (ref 4.22–5.81)
RETIC COUNT ABSOLUTE: 29.5 10*3/uL (ref 19.0–186.0)
Retic Ct Pct: 0.8 % (ref 0.4–3.1)

## 2016-03-29 LAB — PHOSPHORUS: Phosphorus: 4.3 mg/dL (ref 2.5–4.6)

## 2016-03-29 SURGERY — BRONCHOSCOPY, WITH EBUS
Anesthesia: General

## 2016-03-29 MED ORDER — CISATRACURIUM BESYLATE 20 MG/10ML IV SOLN
INTRAVENOUS | Status: AC
  Filled 2016-03-29: qty 10

## 2016-03-29 MED ORDER — SODIUM CHLORIDE 0.9 % IV SOLN
100.0000 ug/h | INTRAVENOUS | Status: DC
  Administered 2016-03-29: 100 ug/h via INTRAVENOUS
  Administered 2016-03-30: 125 ug/h via INTRAVENOUS
  Administered 2016-03-31: 150 ug/h via INTRAVENOUS
  Filled 2016-03-29 (×3): qty 50

## 2016-03-29 MED ORDER — CISATRACURIUM BESYLATE (PF) 10 MG/5ML IV SOLN
INTRAVENOUS | Status: DC | PRN
  Administered 2016-03-29 (×2): 4 mg via INTRAVENOUS
  Administered 2016-03-29: 6 mg via INTRAVENOUS

## 2016-03-29 MED ORDER — MIDAZOLAM HCL 2 MG/2ML IJ SOLN
INTRAMUSCULAR | Status: AC
  Filled 2016-03-29: qty 2

## 2016-03-29 MED ORDER — 0.9 % SODIUM CHLORIDE (POUR BTL) OPTIME
TOPICAL | Status: DC | PRN
  Administered 2016-03-29: 1000 mL

## 2016-03-29 MED ORDER — FENTANYL CITRATE (PF) 100 MCG/2ML IJ SOLN: 100.0000 ug | Freq: Once | INTRAMUSCULAR | Status: DC | PRN

## 2016-03-29 MED ORDER — FENTANYL CITRATE (PF) 100 MCG/2ML IJ SOLN: 100.0000 ug | Freq: Once | INTRAMUSCULAR | Status: DC

## 2016-03-29 MED ORDER — PROPOFOL 1000 MG/100ML IV EMUL
INTRAVENOUS | Status: AC
  Filled 2016-03-29: qty 100

## 2016-03-29 MED ORDER — ROCURONIUM BROMIDE 50 MG/5ML IV SOLN
50.0000 mg | Freq: Once | INTRAVENOUS | Status: AC
  Administered 2016-03-29: 50 mg via INTRAVENOUS

## 2016-03-29 MED ORDER — SODIUM CHLORIDE 0.9 % IV BOLUS (SEPSIS)
500.0000 mL | Freq: Once | INTRAVENOUS | Status: AC
Start: 2016-03-29 — End: 2016-03-29
  Administered 2016-03-29: 500 mL via INTRAVENOUS

## 2016-03-29 MED ORDER — VECURONIUM BROMIDE 10 MG IV SOLR
INTRAVENOUS | Status: AC
Start: 2016-03-29 — End: 2016-03-29
  Administered 2016-03-29: 10 mg
  Filled 2016-03-29: qty 10

## 2016-03-29 MED ORDER — PROPOFOL 10 MG/ML IV BOLUS
INTRAVENOUS | Status: AC
  Filled 2016-03-29: qty 20

## 2016-03-29 MED ORDER — VECURONIUM BROMIDE 10 MG IV SOLR
INTRAVENOUS | Status: AC
Start: 2016-03-29 — End: 2016-03-29
  Filled 2016-03-29: qty 10

## 2016-03-29 MED ORDER — FENTANYL CITRATE (PF) 250 MCG/5ML IJ SOLN
INTRAMUSCULAR | Status: DC | PRN
  Administered 2016-03-29 (×5): 50 ug via INTRAVENOUS

## 2016-03-29 MED ORDER — SODIUM CHLORIDE 0.9 % IV SOLN
3.0000 ug/kg/min | INTRAVENOUS | Status: DC
  Administered 2016-03-29 – 2016-03-31 (×4): 3 ug/kg/min via INTRAVENOUS
  Filled 2016-03-29 (×5): qty 20

## 2016-03-29 MED ORDER — CISATRACURIUM BOLUS VIA INFUSION
0.0500 mg/kg | Freq: Once | INTRAVENOUS | Status: AC
  Administered 2016-03-29: 4.9 mg via INTRAVENOUS
  Filled 2016-03-29: qty 10

## 2016-03-29 MED ORDER — ARTIFICIAL TEARS OP OINT
1.0000 "application " | TOPICAL_OINTMENT | Freq: Three times a day (TID) | OPHTHALMIC | Status: DC
  Administered 2016-03-29 – 2016-03-31 (×6): 1 via OPHTHALMIC
  Filled 2016-03-29: qty 3.5

## 2016-03-29 MED ORDER — VECURONIUM BROMIDE 10 MG IV SOLR
10.0000 mg | Freq: Once | INTRAVENOUS | Status: AC
  Administered 2016-03-29: 10 mg via INTRAVENOUS

## 2016-03-29 MED ORDER — FENTANYL BOLUS VIA INFUSION
50.0000 ug | INTRAVENOUS | Status: DC | PRN
  Filled 2016-03-29: qty 50

## 2016-03-29 MED ORDER — FENTANYL CITRATE (PF) 250 MCG/5ML IJ SOLN
INTRAMUSCULAR | Status: AC
  Filled 2016-03-29: qty 5

## 2016-03-29 MED ORDER — PROPOFOL 1000 MG/100ML IV EMUL
25.0000 ug/kg/min | INTRAVENOUS | Status: DC
  Administered 2016-03-29 – 2016-03-30 (×6): 40 ug/kg/min via INTRAVENOUS
  Filled 2016-03-29 (×4): qty 100

## 2016-03-29 MED ORDER — VITAL HIGH PROTEIN PO LIQD: 1000.0000 mL | ORAL | Status: DC

## 2016-03-29 MED ORDER — SODIUM CHLORIDE 0.9 % IV SOLN
Freq: Once | INTRAVENOUS | Status: AC
  Administered 2016-03-29: 08:00:00 via INTRAVENOUS

## 2016-03-29 SURGICAL SUPPLY — 27 items
BRUSH CYTOL CELLEBRITY 1.5X140 (MISCELLANEOUS) IMPLANT
CANISTER SUCTION 2500CC (MISCELLANEOUS) ×3 IMPLANT
CONT SPEC 4OZ CLIKSEAL STRL BL (MISCELLANEOUS) ×3 IMPLANT
COVER DOME SNAP 22 D (MISCELLANEOUS) IMPLANT
COVER TABLE BACK 60X90 (DRAPES) ×3 IMPLANT
FORCEPS BIOP RJ4 1.8 (CUTTING FORCEPS) IMPLANT
GAUZE SPONGE 4X4 12PLY STRL (GAUZE/BANDAGES/DRESSINGS) IMPLANT
GLOVE BIO SURGEON STRL SZ7.5 (GLOVE) ×3 IMPLANT
GLOVE SURG SS PI 7.0 STRL IVOR (GLOVE) ×3 IMPLANT
GOWN SPEC L4 XLG W/TWL (GOWN DISPOSABLE) ×3 IMPLANT
GOWN STRL REUS W/ TWL LRG LVL3 (GOWN DISPOSABLE) ×1 IMPLANT
GOWN STRL REUS W/TWL LRG LVL3 (GOWN DISPOSABLE) ×2
KIT CLEAN ENDO COMPLIANCE (KITS) ×6 IMPLANT
KIT ROOM TURNOVER OR (KITS) ×3 IMPLANT
MARKER SKIN DUAL TIP RULER LAB (MISCELLANEOUS) ×3 IMPLANT
NEEDLE BIOPSY TRANSBRONCH 21G (NEEDLE) IMPLANT
NEEDLE EBUS SONO TIP PENTAX (NEEDLE) ×6 IMPLANT
NS IRRIG 1000ML POUR BTL (IV SOLUTION) ×3 IMPLANT
OIL SILICONE PENTAX (PARTS (SERVICE/REPAIRS)) ×3 IMPLANT
PAD ARMBOARD 7.5X6 YLW CONV (MISCELLANEOUS) ×6 IMPLANT
SYR 20CC LL (SYRINGE) ×3 IMPLANT
SYR 20ML ECCENTRIC (SYRINGE) ×6 IMPLANT
SYR 5ML LUER SLIP (SYRINGE) ×3 IMPLANT
TOWEL OR 17X24 6PK STRL BLUE (TOWEL DISPOSABLE) ×3 IMPLANT
TRAP SPECIMEN MUCOUS 40CC (MISCELLANEOUS) IMPLANT
TUBE CONNECTING 20'X1/4 (TUBING) ×2
TUBE CONNECTING 20X1/4 (TUBING) ×4 IMPLANT

## 2016-03-29 NOTE — Procedures (Signed)
Bronchoscopy Procedure Note [name] [id number] 15-Apr-1956  Procedure: Bronchoscopy Indications: Diagnostic evaluation of the airways, Remove secretions and Remove clots  Procedure Details Consent: Unable to obtain consent because of emergent medical necessity. Time Out: Verified patient identification, verified procedure, site/side was marked, verified correct patient position, special equipment/implants available, medications/allergies/relevent history reviewed, required imaging and test results available.  Performed  In preparation for procedure, patient was given 100% FiO2 and bronchoscope lubricated. Sedation: Propofol  Airway entered and the following bronchi were examined: LUL and LLL.  R mainstem. Oozing noted from R mainstem with associated R fibrinous clot formation. L was relatively clear.  Procedures performed: Attempted to remove fibrinous clot with forceps, basket snare with little success. At the end of procedure there was still clot occluding much of the R mainstem.   Evaluation Hemodynamic Status: BP stable throughout; O2 sats: Started in teh low 80's, increased to 88% by end of procedure on 1.00 + 12 PEEP Patient's Current Condition: stable Specimens:  None Complications: No apparent complications Patient did tolerate procedure well.   Baltazar Apo, MD, PhD 03/25/2016, 12:00 PM [facility] [phone] or if no answer [phone]

## 2016-03-29 NOTE — Progress Notes (Signed)
PULMONARY / CRITICAL CARE MEDICINE   Name: [name] MRN: [id number] DOB: 09/25/1956    ADMISSION DATE:  03/23/2016 CONSULTATION DATE:  03/23/16  CHIEF COMPLAINT:  Altered mental status  HISTORY OF PRESENT ILLNESS:   60yo male smoker (40 pack years) with hx COPD, CKD 3, HTN, dCHF, anxiety, GERD recently treated for ?PNA, presented 6/8 with SIRS and progressive AMS.  Found to have mediastinal adenopathy and significant refractory hypercalcemia.  Initially admitted by [facility] to SDU but had progression of AMS requiring intubation 6/9 and tx ICU.     SUBJECTIVE:  FiO2 and PEEP down to 0.50 + 8 Still has pulses in B feet No other clinical changes Fever 6/14 pm 101.5  VITAL SIGNS: BP 109/63 mmHg  Pulse 103  Temp(Src) 99.9 F (37.7 C) (Oral)  Resp 32  Ht 5\' 8"  (1.727 m)  Wt 97 kg (213 lb 13.5 oz)  BMI 32.52 kg/m2  SpO2 100%  HEMODYNAMICS:    VENTILATOR SETTINGS: Vent Mode:  [-] PRVC FiO2 (%):  [50 %-90 %] 50 % Set Rate:  [16 bmp] 16 bmp Vt Set:  [550 mL] 550 mL PEEP:  [8 cmH20-12 cmH20] 8 cmH20 Plateau Pressure:  [16 cmH20-28 cmH20] 22 cmH20  INTAKE / OUTPUT: I/O last 3 completed shifts: In: 5547.8 [I.V.:1247.8; Other:600; NG/GT:3250; IV Piggyback:450] Out: 2190 [Urine:2190]  PHYSICAL EXAMINATION: General:  On vent, heavily sedated Neuro:  Sedated on vent HEENT:  Dry mucus membranes, ETT in place Cardiovascular:  RRR, no mgr Lungs:  CTA B, non labored on full vent support. Peep 8 fio2 50%.  Abdomen:  Soft, distended Musculoskeletal: no bony abnormalities Skin:  No rashes. Left foot cooler, +DP pulse with doppler  LABS:  BMET  Recent Labs Lab 04/12/2016 0430 03/28/16 0304 04/12/2016 0415  NA 148* 146* 144  K 3.3* 4.4 5.0  CL 121* 118* 110  CO2 21* 22 22  BUN 35* 55* 83*  CREATININE 1.24 1.72* 2.41*  GLUCOSE 196* 158* 134*    Electrolytes  Recent Labs Lab 03/15/2016 0430 03/28/16 0304 03/31/2016 0415  CALCIUM 8.9 8.7* 8.5*  MG 1.9 1.9 1.9  PHOS 1.7*  3.0 4.3    CBC  Recent Labs Lab 03/28/16 0304 03/28/16 1312 03/27/2016 0415  WBC 15.2* 14.1* 10.2  HGB 9.9* 9.8* 9.4*  HCT 32.7* 31.8* 30.4*  PLT 49* 49* 45*    Coag's  Recent Labs Lab 04/06/2016 1204 03/28/16 0915  APTT 38* 48*  INR 1.17 1.48    Sepsis Markers  Recent Labs Lab 04/03/2016 1204  03/23/16 0039 03/23/16 0705 03/28/16 1015 03/26/2016 0415  LATICACIDVEN  --   < > 2.9* 2.9* 1.5  --   PROCALCITON 0.37  --   --   --  3.68 10.50  < > = values in this interval not displayed.  ABG  Recent Labs Lab 03/28/16 0252 03/28/16 0825 04/07/2016 0334  PHART 7.355 7.345* 7.333*  PCO2ART 36.1 41.4 38.8  PO2ART 72.0* 312.0* 102*    Liver Enzymes  Recent Labs Lab 04/12/2016 0809 03/23/16 1136  AST 27 23  ALT 19 20  ALKPHOS 113 89  BILITOT 0.8 0.7  ALBUMIN 3.2* 2.7*    Cardiac Enzymes  Recent Labs Lab 03/23/16 1453 03/23/16 1940 03/24/16 0207  TROPONINI 0.06* 0.06* 0.06*    Glucose  Recent Labs Lab 03/28/16 0832 03/28/16 1143 03/28/16 1617 03/28/16 1946 03/21/2016 0009 03/30/2016 0341  GLUCAP 132* 133* 131* 146* 127* 120*    Imaging Dg Chest Port 1 View  03/21/2016  CLINICAL DATA: Respiratory failure. EXAM: PORTABLE CHEST 1 VIEW COMPARISON:  03/28/2016. FINDINGS: Endotracheal tube, feeding tube, right IJ lines in stable position. Heart size stable. Persistent but improving bibasilar atelectasis and/or infiltrates. No pleural effusion or pneumothorax. IMPRESSION: 1. Lines and tubes in stable position. 2. Persistent but improving bibasilar atelectasis and/or infiltrates. Electronically Signed   By: Marcello Moores  Register   On: 03/21/2016 07:10   Dg Abd Portable 1v  03/28/2016  CLINICAL DATA:  Abdominal distention. EXAM: PORTABLE ABDOMEN - 1 VIEW COMPARISON:  03/26/2016 FINDINGS: There is a feeding tube with tip in the projection of the distal stomach. Gaseous distension of the large and small bowel loops are again noted. This appears progressive when  compared with the previous exam. IMPRESSION: 1. The feeding tube tip is in the projection of the expected location of distal stomach. 2. Progressive gaseous distension of the bowel loops. Electronically Signed   By: Kerby Moors M.D.   On: 03/28/2016 12:49    ABX:  Vanc 6/9>>> 6/10, 6/13 >> 6/14 Zosyn 6/9>>>  MICRO:  BC x 2 6/9>>> Urine 6/9>>>neg  LINES/TUBES: ETT 6/10>>> R IJ HD cath 6/10>>> remove 6/11 PICC    DISCUSSION: Mr. [name] is a 60 y/o man with mediastinal adenopathy, hypercalcemia and AMS.  Fever and leukocytosis without source of infection.  Progressive renal failure.   ASSESSMENT / PLAN:  PULMONARY A: Acute respiratory failure -- r/t AMS  Increasing B basilar pulm infiltrates > cardiac edema vs ARDS Bilateral mediastinal LAD P:   Increased FIO2 needs 6/14, improving some 6/15.  Planning for EBUS with mediastinal LAD bx on 6/15 F/u CXR  Tube change to 8.5ETT this am  CARDIOVASCULAR A:  HTN CHF Tachycardia  Left foot cooler than right 6/14 Volume overload P:  continue home amlodipine for now  metoprolol to 12.5mg  BID per tube  Arterial Doppler study ordered 6/14, not yet done May need vascular eval in future  RENAL  Recent Labs Lab 04/03/2016 0430 03/28/16 0304 04/11/2016 0415  NA 148* 146* 144   Lab Results  Component Value Date   CREATININE 2.41* 03/29/2016   CREATININE 1.72* 03/28/2016   CREATININE 1.24 04/06/2016   A:   Severe Hypercalcemia > improving, likely due to malignancy or sarcoidosis Acute renal failure CKD 3 Hypernatremia   P:   Favor malignancy as likely source Calcitonin given Appreciate renal assistance Monitor BMET and UOP Replace electrolytes as needed Increased free water 6/13  GASTROINTESTINAL A:   No active issues P:   TF per nutrition   HEMATOLOGIC A:   Mediastinal adenopathy, likely malignancy Thrombocytopenia, HITT Ab negative P:  Plan for EBUS on 6/15 at 08:00 Stopped enoxaparin, HITT panel  negative, consider DIC panel.  d/c'd vanco as a potential contributor to thrombocytopenia 6/14  INFECTIOUS A:   SIRS  Fever > unclear source, line? May also be due to lymphoma or other inflammatory process Doubt HCAP P:   F/u cultures > negative thus far Added empiric vanco on 6/13 to existing pip/tazo,  vanc dc'd 6/14 per hemonc due to thrombocytopenia  ENDOCRINE A:   Hypercalcemia 10.9->10.2-> 8.9->8.7 P:   SSI D/c D5W 6/13  NEUROLOGIC A:   Metabolic encephalopathy due to hypercalecmia  Need for sedation P:   RASS goal: -1 for tube tolerance and vent synchrony  Propofol - wean as able, likely large contributor to current MS Management of hyperCa   FAMILY  - Updates: wife updated at bedside 6/13  - Inter-disciplinary family meet or Palliative Care  meeting due by: 6/17   Independent critical care time is 35 minutes.   Baltazar Apo, MD, PhD 03/17/2016, 7:49 AM [facility] [phone] or if no answer [phone]

## 2016-03-29 NOTE — Procedures (Signed)
PCCM PROCEDURE NOTE  PROCEDURE: ETT tube exchange OPERATORS: R Juanette Urizar,  S MINOR  Meds: rocuronium 50, existing propofol gtt  7.5 ETT exchanged for a 8.5 ETT over a soft tube exchanger with direct visualization using glide scope. Secured at [employer]. Good ETCO2. CXR pending. No complications noted.    Baltazar Apo, MD, PhD 03/30/2016, 8:09 AM [facility] [phone] or if no answer [phone]

## 2016-03-29 NOTE — Progress Notes (Signed)
Pt transported on vent at this time, no complications.

## 2016-03-29 NOTE — Progress Notes (Signed)
IP PROGRESS NOTE  Subjective:   He is sedated on the ventilator.  Objective: Vital signs in last 24 hours: Blood pressure 119/70, pulse 122, temperature 99.8 F (37.7 C), temperature source Oral, resp. rate 16, height 5\' 8"  (1.727 m), weight 213 lb 13.5 oz (97 kg), SpO2 89 %.  Intake/Output from previous day: 06/14 0701 - 06/15 0700 In: 3430.1 [I.V.:880.1; NG/GT:1800; IV Piggyback:150] Out: 1415 [Urine:1415]  Physical Exam:  Vascular: Cool left foot, several toes with purple discoloration.    Lab Results:  Recent Labs  03/28/16 1312 04/12/2016 0415  WBC 14.1* 10.2  HGB 9.8* 9.4*  HCT 31.8* 30.4*  PLT 49* 45*    BMET  Recent Labs  03/28/16 0304 03/25/2016 0415  NA 146* 144  K 4.4 5.0  CL 118* 110  CO2 22 22  GLUCOSE 158* 134*  BUN 55* 83*  CREATININE 1.72* 2.41*  CALCIUM 8.7* 8.5*    Studies/Results: Dg Chest Port 1 View  03/28/2016  CLINICAL DATA:  Intubation EXAM: PORTABLE CHEST 1 VIEW COMPARISON:  Portable exam 0753 hours compared to 03/19/2016 FINDINGS: Tip of endotracheal tube projects 4.8 cm above carina. Feeding tube extends into stomach. Dual-lumen RIGHT jugular central venous catheter with tip projecting over SVC. Upper normal heart size. Stable mediastinal contours. Probable LEFT basilar atelectasis. Lungs otherwise grossly clear for light technique. No pleural effusion or pneumothorax. Bones demineralized. IMPRESSION: Probable subsegmental atelectasis LEFT base. Electronically Signed   By: Lavonia Dana M.D.   On: 04/11/2016 08:07   Dg Chest Port 1 View  03/31/2016  CLINICAL DATA: Respiratory failure. EXAM: PORTABLE CHEST 1 VIEW COMPARISON:  03/28/2016. FINDINGS: Endotracheal tube, feeding tube, right IJ lines in stable position. Heart size stable. Persistent but improving bibasilar atelectasis and/or infiltrates. No pleural effusion or pneumothorax. IMPRESSION: 1. Lines and tubes in stable position. 2. Persistent but improving bibasilar atelectasis and/or  infiltrates. Electronically Signed   By: Marcello Moores  Register   On: 04/03/2016 07:10   Dg Chest Port 1 View  03/28/2016  CLINICAL DATA:  Respiratory failure. EXAM: PORTABLE CHEST 1 VIEW COMPARISON:  03/26/2016 FINDINGS: Endotracheal tube measures 5.7 cm above the carina. Right central venous catheter with tip over the low SVC region. No pneumothorax. Enteric tube tip is off the field of view but below the left hemidiaphragm. Shallow inspiration. Mild cardiac enlargement. Infiltrates or atelectasis in both lung bases, progressing on the right since the previous study. IMPRESSION: Appliances appear in satisfactory position. Increasing infiltrates or atelectasis in the lung bases. Electronically Signed   By: Lucienne Capers M.D.   On: 03/28/2016 02:59   Dg Abd Portable 1v  03/28/2016  CLINICAL DATA:  Abdominal distention. EXAM: PORTABLE ABDOMEN - 1 VIEW COMPARISON:  03/26/2016 FINDINGS: There is a feeding tube with tip in the projection of the distal stomach. Gaseous distension of the large and small bowel loops are again noted. This appears progressive when compared with the previous exam. IMPRESSION: 1. The feeding tube tip is in the projection of the expected location of distal stomach. 2. Progressive gaseous distension of the bowel loops. Electronically Signed   By: Kerby Moors M.D.   On: 03/28/2016 12:49    Medications: I have reviewed the patient's current medications.  Assessment/Plan:  1. Hypercalcemia-Improved 2. Altered Mental status secondary sedation 3. Chest pain-likely secondary to a rib fracture and potentially bone metastases 4. COPD 5. Mediastinal lymphadenopathy 6. Leukocytosis-neutrophilia 7. Fever--most likely tumor fever, no source for infection has been identified 8. Respiratory failure secondary to altered  mental status, status post intubation 03/23/2016  Persistent ventilatory dependent respiratory failure secondary to COPD, pneumonia? 9. Thrombocytopenia-developing in the  hospital, HITT panel negative 10. Anemia-developing in the hospital, secondary to phlebotomy and critical illness 11. Renal failure-chronic/acute 12. Coagulopathy-likely secondary to malnutrition/antibiotics versus DIC 13. Cool/discolored left foot  [name] continues to have ventilator-dependent respiratory failure. The hypercalcemia has resolved. He has  fever and neutrophilia. He has developed progressive renal failure.  The platelet count is stable. The thrombocytopenia may be related to a systemic infection, DIC, or progression of a malignancy involving the bone marrow. I have a low suspicion for hemolytic uremic syndrome. There has been no evidence of hemolysis.  We continue to feel he most likely has an underlying malignancy. I will follow-up on the bronchoscopy results and initiate treatment if a malignancy is confirmed.    Recommendations:  1. Continue management of respiratory failure per critical care medicine 2. Diagnostic bronchoscopy today 3. Vascular evaluation of the left foot  I discussed the situation with his sister today.   LOS: 7 days   Betsy Coder, MD   03/28/2016, 12:33 PM

## 2016-03-29 NOTE — Anesthesia Procedure Notes (Signed)
Date/Time: 03/31/2016 8:25 AM Performed by: Jenne Campus Pre-anesthesia Checklist: Patient identified, Emergency Drugs available, Suction available, Patient being monitored and Timeout performed Patient Re-evaluated:Patient Re-evaluated prior to inductionOxygen Delivery Method: Circle system utilized Preoxygenation: Pre-oxygenation with 100% oxygen Intubation Type: Inhalational induction with existing ETT Tube type: Oral Tube size: 8.5 mm Secured at: 24 cm Tube secured with: Tape

## 2016-03-29 NOTE — Anesthesia Preprocedure Evaluation (Addendum)
Anesthesia Evaluation  Patient identified by MRN, date of birth, ID band Patient awake    Reviewed: Allergy & Precautions, H&P , NPO status , Patient's Chart, lab work & pertinent test results  History of Anesthesia Complications Negative for: history of anesthetic complications  Airway Mallampati: Intubated       Dental  (+) Poor Dentition   Pulmonary COPD, former smoker,       + intubated    Cardiovascular hypertension, Normal cardiovascular exam  Echo with EF 50%   Neuro/Psych PSYCHIATRIC DISORDERS negative neurological ROS     GI/Hepatic Neg liver ROS, GERD  ,  Endo/Other  negative endocrine ROS  Renal/GU Renal disease     Musculoskeletal  (+) Arthritis ,   Abdominal   Peds  Hematology negative hematology ROS (+)   Anesthesia Other Findings Would travel with patients ICU vent and leave on this for procedure, ensure ETT is 8.0 or 8.5 at least, good PIV.Marland Kitchen Patient may need platelets given or DDAVP to help with coagulation in setting of CKD  Reproductive/Obstetrics negative OB ROS                           Anesthesia Physical Anesthesia Plan  ASA: IV  Anesthesia Plan: General   Post-op Pain Management:    Induction: Intravenous  Airway Management Planned: Oral ETT  Additional Equipment:   Intra-op Plan:   Post-operative Plan: Post-operative intubation/ventilation  Informed Consent: I have reviewed the patients History and Physical, chart, labs and discussed the procedure including the risks, benefits and alternatives for the proposed anesthesia with the patient or authorized representative who has indicated his/her understanding and acceptance.   Dental Advisory Given  Plan Discussed with: Anesthesiologist, CRNA and Surgeon  Anesthesia Plan Comments:        Anesthesia Quick Evaluation

## 2016-03-29 NOTE — Progress Notes (Signed)
RN called RT d/t pt desat.  MD and NP at bedside, peep increased to 12, recruitment maneuver done per NP.  Sat still 80%, RT, RN and MD at bedside, preparing to bronch pt now.

## 2016-03-29 NOTE — Anesthesia Postprocedure Evaluation (Signed)
Anesthesia Post Note  Patient: [name]  Procedure(s) Performed: Procedure(s) (LRB): VIDEO BRONCHOSCOPY WITH ENDOBRONCHIAL ULTRASOUND (N/A)  Patient location during evaluation: SICU Anesthesia Type: General Level of consciousness: sedated Pain management: pain level controlled Vital Signs Assessment: post-procedure vital signs reviewed and stable Respiratory status: patient on ventilator - see flowsheet for VS Cardiovascular status: blood pressure returned to baseline and stable Postop Assessment: no signs of nausea or vomiting Anesthetic complications: no    Last Vitals:  Filed Vitals:   03/31/2016 0754 03/19/2016 0800  BP:  130/77  Pulse:  112  Temp: 38.7 C   Resp:  28    Last Pain:  Filed Vitals:   04/10/2016 1025  PainSc: Asleep                 Zenaida Deed

## 2016-03-29 NOTE — Progress Notes (Signed)
Nutrition Follow-up  DOCUMENTATION CODES:   Not applicable  INTERVENTION:    Change TF to Vital High Protein at 70 ml/h (1680 ml per day) to provide 1680 kcals, 147 gm protein, 1404 ml free water daily.  Total intake with TF + propofol will be 2295 kcals (101% of estimated needs).  NUTRITION DIAGNOSIS:   Inadequate oral intake related to inability to eat as evidenced by NPO status.  Ongoing  GOAL:   Patient will meet greater than or equal to 90% of their needs  Met  MONITOR:   Vent status, Labs, Weight trends, TF tolerance, I & O's  ASSESSMENT:   60 y.o. Male of PMH of GERD, HTN, BPV, diastolic CHF, GAD, snoring who presents with SIRS w/o obvious source of infection, and acute encephalopathy likely from exreme hypercalcemia. HIgh level of concern for malignancy.   Patient was intubated on 6/10. S/P emergent bronchoscopy earlier today.  Weight increased with volume overload. Patient is currently intubated on ventilator support MV: 15 L/min Temp (24hrs), Avg:99.8 F (37.7 C), Min:98 F (36.7 C), Max:101.6 F (38.7 C)  Propofol: 23.3 ml/hr providing 615 kcals per day  Currently receiving Vital 1.5 at 50 ml/h (1200 ml per day) providing 2000 kcals, 111 gm protein, 917 ml free water daily.  Diet Order:  Diet NPO time specified  Skin:  Reviewed, no issues  Last BM:  6/13  Height:   Ht Readings from Last 1 Encounters:  03/24/16 5' 8"  (1.727 m)    Weight:   Wt Readings from Last 1 Encounters:  04/11/2016 213 lb 13.5 oz (97 kg)   03/23/16 181 lb (82.1 kg)   (BMI=27.53)       Ideal Body Weight:  61.3 kg  Estimated Nutritional Needs:   Kcal:  2266  Protein:  115-130 gm  Fluid:  2.2 L  EDUCATION NEEDS:   No education needs identified at this time  Molli Barrows, [place], [place], [place] Pager [phone] After Hours Pager [phone]

## 2016-03-29 NOTE — Transfer of Care (Signed)
Immediate Anesthesia Transfer of Care Note  Patient: [name]  Procedure(s) Performed: Procedure(s): VIDEO BRONCHOSCOPY WITH ENDOBRONCHIAL ULTRASOUND (N/A)  Patient Location: ICU  Anesthesia Type:General  Level of Consciousness: unresponsive and Patient remains intubated per anesthesia plan  Airway & Oxygen Therapy: Patient placed on Ventilator (see vital sign flow sheet for setting) and report given to bedside RN  Post-op Assessment: Report given to RN and Post -op Vital signs reviewed and stable  Post vital signs: Reviewed  Last Vitals:  Filed Vitals:   03/27/2016 0754 04/02/2016 0800  BP:  130/77  Pulse:  112  Temp: 38.7 C   Resp:  28    Last Pain:  Filed Vitals:   03/23/2016 0903  PainSc: Asleep         Complications: No apparent anesthesia complications

## 2016-03-29 NOTE — Op Note (Signed)
Video Bronchoscopy with Endobronchial Ultrasound Procedure Note  Date of Operation: 03/23/2016  Pre-op Diagnosis: mediastinal lymphadenopathy, hypercalcemia  Post-op Diagnosis: same  Surgeon: Baltazar Apo  Assistants: none  Anesthesia: General endotracheal anesthesia  Operation: Flexible video fiberoptic bronchoscopy with endobronchial ultrasound and biopsies.  Estimated Blood Loss: [id number]  Complications: none apparent  Indications and History: [name] is a 60 y.o. male never smoker urgently admitted for hypercalcemia. Evaluation revealed diffuse mediastinal lymphadenopathy on CT scan of the chest. Recommendation was made to pursue nodal biopsy and tissue diagnosis via endobronchial ultrasound.The risks, benefits, complications, treatment options and expected outcomes were discussed with the patient.  The possibilities of pneumothorax, pneumonia, reaction to medication, pulmonary aspiration, perforation of a viscus, bleeding, failure to diagnose a condition and creating a complication requiring transfusion or operation were discussed with the patient who freely signed the consent.    Description of Procedure: The patient was examined in the preoperative area and history and data from the preprocedure consultation were reviewed. It was deemed appropriate to proceed.  The patient was taken from ICU to OR10 on mechanical ventilation, identified as [name] and the procedure verified as Flexible Video Fiberoptic Bronchoscopy.  A Time Out was held and the above information confirmed. The video fiberoptic bronchoscope was introduced via the endotracheal tube and a general inspection was performed which showed normal airways without significant secretions. No endobronchial lesions were seen. The standard scope was then withdrawn and the endobronchial ultrasound was used to identify and characterize the peritracheal, hilar and bronchial lymph nodes. Inspection showed widespread adenopathy and  nodal enlargement. Large 4L and 4R nodes were easily identified. Also seen were a large contiguous cluster of nodes in the subcarinal region that were all labelled as Station 7. A 11R node was also identified. Using real-time ultrasound guidance Wang needle biopsies were take from Station 4L, 4R, 7 and 11R nodes and were sent for cytology.  Initial quick staining was suggestive of atypical cells and malignancy so additional samples were collected from 4L to allow further studies. A BAL was performed in the LUL to be sent for cytology and microbiology. The patient tolerated the procedure well without apparent complications. There was no significant blood loss. The bronchoscope was withdrawn. He will transfer back to the ICU for further care.   Samples: 1. Wang needle biopsies from 4L node 2. Wang needle biopsies from 4R node 3. Wang needle biopsies from 7 node 4. Wang needle biopsies from 11R node 5. BAL from the LUL.   Plans:  Patient will be transferred back to ICU for further care pending cytology results. He will remains on abx. We will wean FiO2 and PEEP as able, goal wean sedation as well over the next 1-2 days.   Baltazar Apo, MD, PhD 04/08/2016, 10:04 AM [facility] [phone] or if no answer [phone]

## 2016-03-30 ENCOUNTER — Encounter (HOSPITAL_COMMUNITY): Payer: Self-pay | Admitting: Emergency Medicine

## 2016-03-30 ENCOUNTER — Inpatient Hospital Stay (HOSPITAL_COMMUNITY): Payer: BLUE CROSS/BLUE SHIELD

## 2016-03-30 DIAGNOSIS — I998 Other disorder of circulatory system: Secondary | ICD-10-CM

## 2016-03-30 LAB — BASIC METABOLIC PANEL
ANION GAP: 12 (ref 5–15)
ANION GAP: 16 — AB (ref 5–15)
Anion gap: 12 (ref 5–15)
BUN: 104 mg/dL — ABNORMAL HIGH (ref 6–20)
BUN: 107 mg/dL — ABNORMAL HIGH (ref 6–20)
BUN: 117 mg/dL — ABNORMAL HIGH (ref 6–20)
CALCIUM: 7.7 mg/dL — AB (ref 8.9–10.3)
CALCIUM: 7.8 mg/dL — AB (ref 8.9–10.3)
CALCIUM: 8.4 mg/dL — AB (ref 8.9–10.3)
CHLORIDE: 110 mmol/L (ref 101–111)
CO2: 19 mmol/L — ABNORMAL LOW (ref 22–32)
CO2: 20 mmol/L — ABNORMAL LOW (ref 22–32)
CO2: 19 mmol/L — AB (ref 22–32)
CREATININE: 3.11 mg/dL — AB (ref 0.61–1.24)
CREATININE: 3.36 mg/dL — AB (ref 0.61–1.24)
Chloride: 109 mmol/L (ref 101–111)
Chloride: 105 mmol/L (ref 101–111)
Creatinine, Ser: 3.72 mg/dL — ABNORMAL HIGH (ref 0.61–1.24)
GFR calc non Af Amer: 16 mL/min — ABNORMAL LOW (ref >60)
GFR, EST AFRICAN AMERICAN: 19 mL/min — AB (ref >60)
GFR, EST AFRICAN AMERICAN: 22 mL/min — AB (ref >60)
GFR, EST AFRICAN AMERICAN: 24 mL/min — AB (ref >60)
GFR, EST NON AFRICAN AMERICAN: 19 mL/min — AB (ref >60)
GFR, EST NON AFRICAN AMERICAN: 20 mL/min — AB (ref >60)
Glucose, Bld: 127 mg/dL — ABNORMAL HIGH (ref 65–99)
Glucose, Bld: 122 mg/dL — ABNORMAL HIGH (ref 65–99)
Glucose, Bld: 116 mg/dL — ABNORMAL HIGH (ref 65–99)
Potassium: 6.3 mmol/L (ref 3.5–5.1)
Potassium: 6 mmol/L — ABNORMAL HIGH (ref 3.5–5.1)
Potassium: 5.5 mmol/L — ABNORMAL HIGH (ref 3.5–5.1)
SODIUM: 140 mmol/L (ref 135–145)
SODIUM: 142 mmol/L (ref 135–145)
Sodium: 140 mmol/L (ref 135–145)

## 2016-03-30 LAB — CBC
HCT: 30.5 % — ABNORMAL LOW (ref 39.0–52.0)
Hemoglobin: 9.2 g/dL — ABNORMAL LOW (ref 13.0–17.0)
MCH: 28.8 pg (ref 26.0–34.0)
MCHC: 30.2 g/dL (ref 30.0–36.0)
MCV: 95.6 fL (ref 78.0–100.0)
PLATELETS: 110 10*3/uL — AB (ref 150–400)
RBC: 3.19 MIL/uL — ABNORMAL LOW (ref 4.22–5.81)
RDW: 16 % — AB (ref 11.5–15.5)
WBC: 7.1 10*3/uL (ref 4.0–10.5)

## 2016-03-30 LAB — URINALYSIS, ROUTINE W REFLEX MICROSCOPIC
Bilirubin Urine: NEGATIVE
GLUCOSE, UA: NEGATIVE mg/dL
Ketones, ur: NEGATIVE mg/dL
Leukocytes, UA: NEGATIVE
Nitrite: NEGATIVE
Protein, ur: 100 mg/dL — AB
SPECIFIC GRAVITY, URINE: 1.021 (ref 1.005–1.030)
pH: 5 (ref 5.0–8.0)

## 2016-03-30 LAB — PREPARE PLATELET PHERESIS
UNIT DIVISION: 0
Unit division: 0
Unit division: 0

## 2016-03-30 LAB — URINE MICROSCOPIC-ADD ON

## 2016-03-30 LAB — GLUCOSE, CAPILLARY
GLUCOSE-CAPILLARY: 109 mg/dL — AB (ref 65–99)
GLUCOSE-CAPILLARY: 108 mg/dL — AB (ref 65–99)
Glucose-Capillary: 101 mg/dL — ABNORMAL HIGH (ref 65–99)
Glucose-Capillary: 110 mg/dL — ABNORMAL HIGH (ref 65–99)

## 2016-03-30 LAB — MAGNESIUM: MAGNESIUM: 2 mg/dL (ref 1.7–2.4)

## 2016-03-30 LAB — ACID FAST SMEAR (AFB, MYCOBACTERIA)

## 2016-03-30 LAB — URIC ACID: Uric Acid, Serum: 5.7 mg/dL (ref 4.4–7.6)

## 2016-03-30 LAB — PROCALCITONIN: PROCALCITONIN: 45.17 ng/mL

## 2016-03-30 LAB — ACID FAST SMEAR (AFB): ACID FAST SMEAR - AFSCU2: NEGATIVE

## 2016-03-30 LAB — TRIGLYCERIDES: TRIGLYCERIDES: 707 mg/dL — AB (ref <150)

## 2016-03-30 MED ORDER — HEPARIN SODIUM (PORCINE) 1000 UNIT/ML DIALYSIS
1000.0000 [IU] | INTRAMUSCULAR | Status: DC | PRN
  Administered 2016-03-30: 3000 [IU] via INTRAVENOUS_CENTRAL
  Filled 2016-03-30: qty 6
  Filled 2016-03-30: qty 3
  Filled 2016-03-30: qty 6

## 2016-03-30 MED ORDER — PIPERACILLIN-TAZOBACTAM 3.375 G IVPB
3.3750 g | Freq: Four times a day (QID) | INTRAVENOUS | Status: DC
  Filled 2016-03-30 (×2): qty 50

## 2016-03-30 MED ORDER — HEPARIN SODIUM (PORCINE) 1000 UNIT/ML DIALYSIS
1000.0000 [IU] | INTRAMUSCULAR | Status: DC | PRN
  Filled 2016-03-30: qty 6

## 2016-03-30 MED ORDER — PRISMASOL BGK 4/2.5 32-4-2.5 MEQ/L IV SOLN
INTRAVENOUS | Status: DC
  Administered 2016-03-30 – 2016-03-31 (×5): via INTRAVENOUS_CENTRAL
  Filled 2016-03-30 (×10): qty 5000

## 2016-03-30 MED ORDER — SODIUM CHLORIDE 0.9 % FOR CRRT
INTRAVENOUS_CENTRAL | Status: DC | PRN
  Filled 2016-03-30: qty 1000

## 2016-03-30 MED ORDER — PRISMASOL BGK 4/2.5 32-4-2.5 MEQ/L IV SOLN
INTRAVENOUS | Status: DC
Start: 2016-03-30 — End: 2016-03-31
  Administered 2016-03-30: 19:00:00 via INTRAVENOUS_CENTRAL
  Filled 2016-03-30 (×2): qty 5000

## 2016-03-30 MED ORDER — SODIUM POLYSTYRENE SULFONATE 15 GM/60ML PO SUSP
60.0000 g | Freq: Once | ORAL | Status: AC
  Administered 2016-03-30: 60 g
  Filled 2016-03-30: qty 240

## 2016-03-30 MED ORDER — CALCIUM CHLORIDE 10 % IV SOLN
1.0000 g | Freq: Once | INTRAVENOUS | Status: AC
  Administered 2016-03-30: 1 g via INTRAVENOUS
  Filled 2016-03-30: qty 10

## 2016-03-30 MED ORDER — SODIUM BICARBONATE 8.4 % IV SOLN
INTRAVENOUS | Status: DC
  Administered 2016-03-30: 09:00:00 via INTRAVENOUS
  Filled 2016-03-30 (×2): qty 150

## 2016-03-30 MED ORDER — SODIUM CHLORIDE 0.9 % IV SOLN
1.0000 mg/h | INTRAVENOUS | Status: DC
  Administered 2016-03-30: 2 mg/h via INTRAVENOUS
  Administered 2016-03-31 (×2): 3 mg/h via INTRAVENOUS
  Filled 2016-03-30 (×4): qty 10

## 2016-03-30 MED ORDER — PROPOFOL 1000 MG/100ML IV EMUL
INTRAVENOUS | Status: AC
  Filled 2016-03-30: qty 100

## 2016-03-30 MED ORDER — DEXTROSE 50 % IV SOLN
1.0000 | Freq: Once | INTRAVENOUS | Status: AC
  Administered 2016-03-30: 50 mL via INTRAVENOUS
  Filled 2016-03-30: qty 50

## 2016-03-30 MED ORDER — FREE WATER
300.0000 mL | Freq: Four times a day (QID) | Status: DC
  Administered 2016-03-30 – 2016-03-31 (×4): 300 mL

## 2016-03-30 MED ORDER — PIPERACILLIN-TAZOBACTAM 3.375 G IVPB
3.3750 g | Freq: Four times a day (QID) | INTRAVENOUS | Status: DC
  Administered 2016-03-30 – 2016-03-31 (×3): 3.375 g via INTRAVENOUS
  Filled 2016-03-30 (×5): qty 50

## 2016-03-30 MED ORDER — PRISMASOL BGK 4/2.5 32-4-2.5 MEQ/L IV SOLN
INTRAVENOUS | Status: DC
  Administered 2016-03-30: 19:00:00 via INTRAVENOUS_CENTRAL
  Filled 2016-03-30 (×2): qty 5000

## 2016-03-30 MED ORDER — INSULIN ASPART 100 UNIT/ML IV SOLN
5.0000 [IU] | Freq: Once | INTRAVENOUS | Status: AC
  Administered 2016-03-30: 5 [IU] via INTRAVENOUS

## 2016-03-30 NOTE — Progress Notes (Signed)
IP PROGRESS NOTE  Subjective:   He is sedated on the ventilator. Events of yesterday reviewed with the nursing staff. His family is not present this morning.  Objective: Vital signs in last 24 hours: Blood pressure 99/63, pulse 102, temperature 97.7 F (36.5 C), temperature source Oral, resp. rate 30, height 5\' 8"  (1.727 m), weight 216 lb 7.9 oz (98.2 kg), SpO2 100 %.  Intake/Output from previous day: 06/15 0701 - 06/16 0700 In: 3698.7 [I.V.:2063.5; Blood:616; NG/GT:369.2; IV Piggyback:650] Out: 626 [Urine:616; Blood:10]  Physical Exam: Lungs: Decreased breath sounds in the right chest Abdomen: Mildly distended, no hepatosplenomegaly Vascular: Cool left foot, several toes with purple discoloration. Left femoral pulse intact    Lab Results:  Recent Labs  03/21/2016 0415 03/30/16 0400  WBC 10.2 7.1  HGB 9.4* 9.2*  HCT 30.4* 30.5*  PLT 45* 110*    BMET  Recent Labs  03/22/2016 2353 03/30/16 0400  NA 140 142  K 6.3* 6.0*  CL 109 110  CO2 19* 20*  GLUCOSE 127* 122*  BUN 104* 107*  CREATININE 3.11* 3.36*  CALCIUM 7.8* 8.4*  03/31/2016: [id number], bilirubin 0.8, AST 84  Studies/Results: Dg Chest Port 1 View  03/30/2016  CLINICAL DATA: Respiratory failure. EXAM: PORTABLE CHEST 1 VIEW COMPARISON:  03/28/2016. FINDINGS: Endotracheal tube, feeding tube, right IJ line in stable position. Interim re-expansion of the right lung. Mild bibasilar atelectasis. No pleural effusion or pneumothorax. Cardiomegaly with mild pulmonary vascular prominence and interstitial prominence. Mild component congestive heart failure cannot be excluded. IMPRESSION: 1. Lines and tubes in stable position. 2. Re-expansion of the right lung. Mild bibasilar subsegmental atelectasis. 3. Cardiomegaly with mild pulmonary vascular prominence interstitial prominence suggesting mild congestive heart failure . Electronically Signed   By: [name] Marshall  Register   On: 03/30/2016 07:13   Dg Chest Port 1 View  03/26/2016   CLINICAL DATA:  Respiratory failure EXAM: PORTABLE CHEST 1 VIEW COMPARISON:  04/03/2016 FINDINGS: Cardiomediastinal silhouette is stable. Endotracheal tube in place with tip 4.1 cm above the carina. Stable NG tube position. Stable right IJ central line position. There is complete opacification of the right hemithorax. Atelectasis, infiltrate or pleural effusion cannot be excluded. Left lung is clear. IMPRESSION: Stable support apparatus. Complete opacification of the right hemi thorax. Atelectasis, infiltrate or pleural effusion cannot be excluded. The left lung is clear. Electronically Signed   By: [name] Marshall M.D.   On: 04/04/2016 12:32   Dg Chest Port 1 View  03/18/2016  CLINICAL DATA:  Intubation EXAM: PORTABLE CHEST 1 VIEW COMPARISON:  Portable exam 0753 hours compared to 03/16/2016 FINDINGS: Tip of endotracheal tube projects 4.8 cm above carina. Feeding tube extends into stomach. Dual-lumen RIGHT jugular central venous catheter with tip projecting over SVC. Upper normal heart size. Stable mediastinal contours. Probable LEFT basilar atelectasis. Lungs otherwise grossly clear for light technique. No pleural effusion or pneumothorax. Bones demineralized. IMPRESSION: Probable subsegmental atelectasis LEFT base. Electronically Signed   By: [name] Marshall M.D.   On: 04/08/2016 08:07   Dg Chest Port 1 View  03/16/2016  CLINICAL DATA: Respiratory failure. EXAM: PORTABLE CHEST 1 VIEW COMPARISON:  03/28/2016. FINDINGS: Endotracheal tube, feeding tube, right IJ lines in stable position. Heart size stable. Persistent but improving bibasilar atelectasis and/or infiltrates. No pleural effusion or pneumothorax. IMPRESSION: 1. Lines and tubes in stable position. 2. Persistent but improving bibasilar atelectasis and/or infiltrates. Electronically Signed   By: [name] Marshall  Register   On: 03/19/2016 07:10   Dg Abd Portable 1v  03/28/2016  CLINICAL DATA:  Abdominal distention. EXAM: PORTABLE ABDOMEN - 1 VIEW COMPARISON:   03/26/2016 FINDINGS: There is a feeding tube with tip in the projection of the distal stomach. Gaseous distension of the large and small bowel loops are again noted. This appears progressive when compared with the previous exam. IMPRESSION: 1. The feeding tube tip is in the projection of the expected location of distal stomach. 2. Progressive gaseous distension of the bowel loops. Electronically Signed   By: [name] Marshall M.D.   On: 03/28/2016 12:49    Medications: I have reviewed the patient's current medications.  Assessment/Plan:  1. Hypercalcemia-Improved 2. Altered Mental status secondary sedation 3. Chest pain-likely secondary to a rib fracture and potentially bone metastases 4. COPD 5. Mediastinal lymphadenopathy 6. Leukocytosis-neutrophilia 7. Fever--most likely tumor fever, no source for infection has been identified 8. Respiratory failure secondary to altered mental status, status post intubation 03/23/2016  Persistent ventilatory dependent respiratory failure secondary to COPD, pneumonia? 9. Thrombocytopenia-developing in the hospital, HITT panel negative, improved following platelet transfusion yesterday 10. Anemia-developing in the hospital, secondary to phlebotomy and critical illness 11. Renal failure-chronic/acute, progressive 12. Coagulopathy-likely secondary to malnutrition/antibiotics versus DIC 13. Cool/discolored left foot  [name] Marshall continues to have ventilator-dependent respiratory failure. He is critically ill with multiorgan failure. He underwent a bronchoscopy and biopsy of mixed and lymph node yesterday. The cytology is pending. I discussed the case with the pathologist yesterday and again this morning. He will page me when he has reviewed the slides.  I have a high clinical suspicion for lymphoma. We should consider systemic chemotherapy despite his multiple medical conditions. We can treat him for lymphoma while on the ventilator and despite the renal failure.  If  the cytology review confirms a hematopoietic malignancy I will recommend [facility] chemotherapy. He will need support from the renal service and rasburicase.   I discussed the case with Dr. Lamonte Sakai. He will communicate with the family today. I am available to discuss the situation with his family. Oncology will continue following him daily.  Recommendations:  1. Continue management of respiratory failure per critical care medicine 2. Renal failure per nephrology 3. Vascular evaluation of the left foot 4. Check uric acid 5. Follow-up pathology today, begin chemotherapy if a hematopoietic malignancy is confirmed.     LOS: 8 days   [name] Coder, MD   03/30/2016, 8:37 AM

## 2016-03-30 NOTE — Care Management Note (Addendum)
Case Management Note  Patient Details  Name: Eldric Joachim MRN: [id number] Date of Birth: Jan 17, 1956  Subjective/Objective:       Pt admitted with altered mental status - pt is intubated             Action/Plan:  PTA from home with wife.  PT will access pt post extubation.  Current plan is to begin CRRT tonight.    CM will continue to monitor for disposition needs   Expected Discharge Date:  03/30/2016               Expected Discharge Plan:     In-House Referral:     Discharge planning Services  CM Consult  Post Acute Care Choice:    Choice offered to:     DME Arranged:    DME Agency:     HH Arranged:    HH Agency:     Status of Service:  In process, will continue to follow  Medicare Important Message Given:    Date Medicare IM Given:    Medicare IM give by:    Date Additional Medicare IM Given:    Additional Medicare Important Message give by:     If discussed at [place] of Stay Meetings, dates discussed:    Additional Comments:  Maryclare Labrador, RN 03/30/2016, 2:08 PM

## 2016-03-30 NOTE — Progress Notes (Signed)
VASCULAR LAB PRELIMINARY  ARTERIAL  ABI completed: Bilateral within normal limits.    RIGHT    LEFT    PRESSURE WAVEFORM  PRESSURE WAVEFORM  BRACHIAL 139 Bi BRACHIAL 139 Bi  DP   DP    AT 123 Bi AT 119 Bi  PT 133 Bi PT 140 Bi  PER   PER    GREAT TOE  NA GREAT TOE  NA    RIGHT LEFT  ABI 0.96 1.01     Landry Mellow, RDMS, RVT  03/30/2016, 3:01 PM

## 2016-03-30 NOTE — Progress Notes (Signed)
Subjective:  Patient followed earlier on in hospitalization for AKI in the setting of hyperkalemia- which improved with medical treatment- crt was 1.24 on 6/13- but has developed worsening renal function since then- had a bronch with biopsy for his lymphadenopathy but no results yet    Objective Vital signs in last 24 hours: Filed Vitals:   03/30/16 0700 03/30/16 0715 03/30/16 0730 03/30/16 0745  BP: 110/70 108/66 109/68 106/64  Pulse: 101 100 101 101  Temp:      TempSrc:      Resp: 0 23 30 30   Height:      Weight:      SpO2: 100% 100% 100% 100%   Weight change: 1.2 kg (2 lb 10.3 oz)  Intake/Output Summary (Last 24 hours) at 03/30/16 0804 Last data filed at 03/30/16 0800  Gross per 24 hour  Intake   3502 ml  Output    471 ml  Net   3031 ml    Assessment/ Plan: Pt is a 60 y.o. yo male who was admitted on 03/28/2016 with hypercalcemia, mediastinal lymphadenopathy- now developed worsening renal failure   Assessment/Plan: 1. AKI- no obvious nephrotoxins- possible relative hypotension and ATN if has a long history of HTN- getting close to the threshold of requiring dialysis.  I have discussed with CCM- they will discuss with family if they desire dialysis support given the circumstances. Would probably need CRRT.   I feel he should have a reasonable chance for renal recovery as I would not consider him a long term dialysis candidate given his likely malignancy   2. HTN/vol- does not seem dry- will stop norvasc and allow more blood pressure for better kidney perfusion 3. Anemia- in hte 9's supportive care 4. [place]- medical treatment for K - will add some bicarb  [name] A    Labs: Basic Metabolic Panel:  Recent Labs Lab 03/28/2016 0430 03/28/16 0304 03/30/2016 0415 03/28/2016 1418 04/03/2016 2353 03/30/16 0400  NA 148* 146* 144 141 140 142  K 3.3* 4.4 5.0 6.4* 6.3* 6.0*  CL 121* 118* 110 109 109 110  CO2 21* 22 22 18* 19* 20*  GLUCOSE 196* 158* 134* 128* 127* 122*   BUN 35* 55* 83* 91* 104* 107*  CREATININE 1.24 1.72* 2.41* 2.99* 3.11* 3.36*  CALCIUM 8.9 8.7* 8.5* 8.1* 7.8* 8.4*  PHOS 1.7* 3.0 4.3  --   --   --    Liver Function Tests:  Recent Labs Lab 03/23/16 1136 04/11/2016 1418  AST 23 84*  ALT 20 38  ALKPHOS 89 251*  BILITOT 0.7 0.8  PROT 7.0 5.8*  ALBUMIN 2.7* 1.5*   No results for input(s): LIPASE, AMYLASE in the last 168 hours. No results for input(s): AMMONIA in the last 168 hours. CBC:  Recent Labs Lab 03/28/2016 0430 03/28/16 0304 03/28/16 1312 04/03/2016 0415 03/30/16 0400  WBC 19.3* 15.2* 14.1* 10.2 7.1  NEUTROABS  --   --  9.3*  --   --   HGB 10.6* 9.9* 9.8* 9.4* 9.2*  HCT 34.6* 32.7* 31.8* 30.4* 30.5*  MCV 94.3 94.8 96.4 93.5 95.6  PLT 66* 49* 49* 45* 110*   Cardiac Enzymes:  Recent Labs Lab 03/23/16 1453 03/23/16 1940 03/24/16 0207  TROPONINI 0.06* 0.06* 0.06*   CBG:  Recent Labs Lab 03/23/2016 0341 03/18/2016 1057 03/28/2016 1517 03/25/2016 2002 04/02/2016 2340  GLUCAP 120* 116* 119* 104* 118*    Iron Studies: No results for input(s): IRON, TIBC, TRANSFERRIN, FERRITIN in the last 72 hours. Studies/Results: Dg  Chest Port 1 View  03/30/2016  CLINICAL DATA: Respiratory failure. EXAM: PORTABLE CHEST 1 VIEW COMPARISON:  03/31/2016. FINDINGS: Endotracheal tube, feeding tube, right IJ line in stable position. Interim re-expansion of the right lung. Mild bibasilar atelectasis. No pleural effusion or pneumothorax. Cardiomegaly with mild pulmonary vascular prominence and interstitial prominence. Mild component congestive heart failure cannot be excluded. IMPRESSION: 1. Lines and tubes in stable position. 2. Re-expansion of the right lung. Mild bibasilar subsegmental atelectasis. 3. Cardiomegaly with mild pulmonary vascular prominence interstitial prominence suggesting mild congestive heart failure . Electronically Signed   By: Marcello Moores  Register   On: 03/30/2016 07:13   Dg Chest Port 1 View  03/28/2016  CLINICAL DATA:   Respiratory failure EXAM: PORTABLE CHEST 1 VIEW COMPARISON:  04/09/2016 FINDINGS: Cardiomediastinal silhouette is stable. Endotracheal tube in place with tip 4.1 cm above the carina. Stable NG tube position. Stable right IJ central line position. There is complete opacification of the right hemithorax. Atelectasis, infiltrate or pleural effusion cannot be excluded. Left lung is clear. IMPRESSION: Stable support apparatus. Complete opacification of the right hemi thorax. Atelectasis, infiltrate or pleural effusion cannot be excluded. The left lung is clear. Electronically Signed   By: Lahoma Crocker M.D.   On: 03/19/2016 12:32   Dg Chest Port 1 View  04/12/2016  CLINICAL DATA:  Intubation EXAM: PORTABLE CHEST 1 VIEW COMPARISON:  Portable exam 0753 hours compared to 03/21/2016 FINDINGS: Tip of endotracheal tube projects 4.8 cm above carina. Feeding tube extends into stomach. Dual-lumen RIGHT jugular central venous catheter with tip projecting over SVC. Upper normal heart size. Stable mediastinal contours. Probable LEFT basilar atelectasis. Lungs otherwise grossly clear for light technique. No pleural effusion or pneumothorax. Bones demineralized. IMPRESSION: Probable subsegmental atelectasis LEFT base. Electronically Signed   By: Lavonia Dana M.D.   On: 03/17/2016 08:07   Dg Chest Port 1 View  03/23/2016  CLINICAL DATA: Respiratory failure. EXAM: PORTABLE CHEST 1 VIEW COMPARISON:  03/28/2016. FINDINGS: Endotracheal tube, feeding tube, right IJ lines in stable position. Heart size stable. Persistent but improving bibasilar atelectasis and/or infiltrates. No pleural effusion or pneumothorax. IMPRESSION: 1. Lines and tubes in stable position. 2. Persistent but improving bibasilar atelectasis and/or infiltrates. Electronically Signed   By: Marcello Moores  Register   On: 03/16/2016 07:10   Dg Abd Portable 1v  03/28/2016  CLINICAL DATA:  Abdominal distention. EXAM: PORTABLE ABDOMEN - 1 VIEW COMPARISON:  03/26/2016 FINDINGS:  There is a feeding tube with tip in the projection of the distal stomach. Gaseous distension of the large and small bowel loops are again noted. This appears progressive when compared with the previous exam. IMPRESSION: 1. The feeding tube tip is in the projection of the expected location of distal stomach. 2. Progressive gaseous distension of the bowel loops. Electronically Signed   By: Kerby Moors M.D.   On: 03/28/2016 12:49   Medications: Infusions: . cisatracurium (NIMBEX) infusion 3.007 mcg/kg/min (03/30/16 0800)  . feeding supplement (VITAL HIGH PROTEIN)    . fentaNYL infusion INTRAVENOUS 125 mcg/hr (03/30/16 0800)  . propofol (DIPRIVAN) infusion 40.034 mcg/kg/min (03/30/16 0800)    Scheduled Medications: . amLODipine  10 mg Oral Daily  . antiseptic oral rinse  7 mL Mouth Rinse QID  . artificial tears  1 application Both Eyes B2E  . chlorhexidine gluconate (SAGE KIT)  15 mL Mouth Rinse BID  . fentaNYL (SUBLIMAZE) injection  100 mcg Intravenous Once  . free water  300 mL Per Tube Q4H  . insulin aspart  0-9  Units Subcutaneous Q4H  . metoprolol tartrate  12.5 mg Per Tube BID  . pantoprazole sodium  40 mg Per Tube Daily  . piperacillin-tazobactam (ZOSYN)  IV  3.375 g Intravenous Q8H  . sodium chloride flush  3 mL Intravenous Q12H    have reviewed scheduled and prn medications.  Physical Exam: General:sedated, paralyzed Heart: tachy Lungs: CBS bilat Abdomen: distended Extremities: dependent edema Dialysis Access: trialysis cath  - placed 6/10   03/30/2016,8:04 AM  LOS: 8 days

## 2016-03-30 NOTE — Progress Notes (Signed)
CRITICAL VALUE ALERT  Critical value received:  Potassium 6.4  Date of notification:  04/06/2016  Time of notification:  2304  Critical value read back:Yes.    Nurse who received alert:  Bronwen Betters RN  MD notified (1st page):  Dr. Jimmy Footman  Time of first page:  2308  MD notified (2nd page): Dr. Jimmy Footman   Time of second page: 2330  Responding MD:  Dr. Jimmy Footman   Time MD responded:  0010 Potassium level was drawn at 1400 on 04/07/2016 but I was not notified of the critical value until 2300.  Dr. Vonzella Nipple a STAT BMET.  Will continue to monitor patient .

## 2016-03-30 NOTE — Progress Notes (Signed)
I see the discussion held with family per Dr. Lamonte Sakai and have talked to them myself.  As I previously noted- he should have a reasonable chance for renal recovery in the short term.  Because of that family and I think it is reasonable to pursue at least short term dialysis support if needed.  Is making some urine and have access for HD- I am going to wait until the labs are done this evening to assess the potassium and if it is still abnormal will go ahead and initiate CRRT tonight - if is less than 5.5 may wait until the AM to start   [name]

## 2016-03-30 NOTE — Progress Notes (Signed)
CRITICAL VALUE ALERT  Critical value received:  Potassium 6.3  Date of notification:  03/30/16   Time of notification:  0103  Critical value read back:Yes.    Nurse who received alert:  Donald Siva RN  MD notified (1st page):  Dr. Jimmy Footman  Time of first page:  0103  MD notified (2nd page):  Time of second page:  Responding MD:  Dr. Jimmy Footman  Time MD responded:  [id number]

## 2016-03-30 NOTE — Progress Notes (Signed)
[facility] Progress Note Patient Name: [name] DOB: 03-Dec-1955 MRN: [id number]   Date of Service  03/30/2016  HPI/Events of Note  Hyperkalemia in the setting of bleeding.  K of 6.3 with no overt EKG changes.  eICU Interventions  Plan: Insulin/D50 IV Calcium chloride IV Kayexalate 60 gm via tube F/U BMET in AM     Intervention Category Major Interventions: Electrolyte abnormality - evaluation and management  DETERDING,ELIZABETH 03/30/2016, 1:07 AM

## 2016-03-30 NOTE — Progress Notes (Signed)
I discussed the pathology findings with Dr. Avis Epley this afternoon. The lymph nodes from the bronchoscopic biopsies are involved with a poorly differentiated carcinoma. The immunohistochemical profile is consistent with adenocarcinoma of the lung.  I discussed these findings with Dr. Lamonte Sakai. I met with [name] sister and wife.  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 He could also have metastatic adenocarcinoma of unknown primary. In either case no therapy will be curative. His prognosis is very poor with the current multi-organ failure.  We had planned to consider systemic chemotherapy if he were diagnosed with a hematopoietic malignancy or small cell lung cancer. I do not recommend chemotherapy with this diagnosis.  I recommend comfort care. His sister and wife understand and are in agreement.  Decisions on continuing ventilator support and dialysis can be made in conjunction with the critical care and nephrology services.  Please call oncology as needed.

## 2016-03-30 NOTE — Progress Notes (Signed)
PULMONARY / CRITICAL CARE MEDICINE   Name: Ebon Stables MRN: [id number] DOB: 11-Sep-1956    ADMISSION DATE:  03/18/2016 CONSULTATION DATE:  03/23/16  CHIEF COMPLAINT:  Altered mental status  HISTORY OF PRESENT ILLNESS:   60yo male smoker (40 pack years) with hx COPD, CKD 3, HTN, dCHF, anxiety, GERD recently treated for ?PNA, presented 6/8 with SIRS and progressive AMS.  Found to have mediastinal adenopathy and significant refractory hypercalcemia.  Initially admitted by [facility] to SDU but had progression of AMS requiring intubation 6/9 and tx ICU.     SUBJECTIVE / Interval:  FiO2 and PEEP remain high LE arterial dopplers still haven't been done EBUS yesterday c/b R mainstem clot and R collapse > better this am  VITAL SIGNS: BP 99/63 mmHg  Pulse 102  Temp(Src) 97.7 F (36.5 C) (Oral)  Resp 30  Ht 5\' 8"  (1.727 m)  Wt 98.2 kg (216 lb 7.9 oz)  BMI 32.93 kg/m2  SpO2 100%  HEMODYNAMICS:    VENTILATOR SETTINGS: Vent Mode:  [-] PRVC FiO2 (%):  [90 %-100 %] 90 % Set Rate:  [16 bmp-30 bmp] 30 bmp Vt Set:  [500 mL-550 mL] 500 mL PEEP:  [10 cmH20-14 cmH20] 14 cmH20 Plateau Pressure:  [24 cmH20-29 cmH20] 27 cmH20  INTAKE / OUTPUT: I/O last 3 completed shifts: In: 5095.1 [I.V.:2509.9; Blood:616; NG/GT:1269.2; IV Piggyback:700] Out: [id number] [Urine:1521; Blood:10]  PHYSICAL EXAMINATION: General:  On vent, heavily sedated Neuro:  Paralyzed and sedated HEENT:  Dry mucus membranes, ETT in place Cardiovascular:  RRR, no mgr Lungs: decreased on R, coarse bilaterally.  Abdomen:  Soft, distended Musculoskeletal: no bony abnormalities Skin:  No rashes. Left foot cooler, +DP pulse with doppler  LABS:  BMET  Recent Labs Lab 04/09/2016 1418 04/12/2016 2353 03/30/16 0400  NA 141 140 142  K 6.4* 6.3* 6.0*  CL 109 109 110  CO2 18* 19* 20*  BUN 91* 104* 107*  CREATININE 2.99* 3.11* 3.36*  GLUCOSE 128* 127* 122*    Electrolytes  Recent Labs Lab 04/03/2016 0430 03/28/16 0304  03/21/2016 0415 04/13/2016 1418 04/12/2016 2353 03/30/16 0400  CALCIUM 8.9 8.7* 8.5* 8.1* 7.8* 8.4*  MG 1.9 1.9 1.9  --   --  2.0  PHOS 1.7* 3.0 4.3  --   --   --     CBC  Recent Labs Lab 03/28/16 1312 03/18/2016 0415 03/30/16 0400  WBC 14.1* 10.2 7.1  HGB 9.8* 9.4* 9.2*  HCT 31.8* 30.4* 30.5*  PLT 49* 45* 110*    Coag's  Recent Labs Lab 03/28/16 0915  APTT 48*  INR 1.48    Sepsis Markers  Recent Labs Lab 03/28/16 1015 03/27/2016 0415 03/30/16 0400  LATICACIDVEN 1.5  --   --   PROCALCITON 3.68 10.50 45.17    ABG  Recent Labs Lab 04/07/2016 0334 04/03/2016 1432 04/02/2016 1731  PHART 7.333* 7.022* 7.253*  PCO2ART 38.8 80.4* 46.6*  PO2ART 102* 58.0* 165.0*    Liver Enzymes  Recent Labs Lab 03/23/16 1136 03/31/2016 1418  AST 23 84*  ALT 20 38  ALKPHOS 89 251*  BILITOT 0.7 0.8  ALBUMIN 2.7* 1.5*    Cardiac Enzymes  Recent Labs Lab 03/23/16 1453 03/23/16 1940 03/24/16 0207  TROPONINI 0.06* 0.06* 0.06*    Glucose  Recent Labs Lab 04/02/2016 0341 04/12/2016 1057 03/18/2016 1517 03/28/2016 2002 03/31/2016 2340 03/30/16 0813  GLUCAP 120* 116* 119* 104* 118* 109*    Imaging Dg Chest Port 1 View  03/30/2016  CLINICAL DATA: Respiratory failure. EXAM:  PORTABLE CHEST 1 VIEW COMPARISON:  03/21/2016. FINDINGS: Endotracheal tube, feeding tube, right IJ line in stable position. Interim re-expansion of the right lung. Mild bibasilar atelectasis. No pleural effusion or pneumothorax. Cardiomegaly with mild pulmonary vascular prominence and interstitial prominence. Mild component congestive heart failure cannot be excluded. IMPRESSION: 1. Lines and tubes in stable position. 2. Re-expansion of the right lung. Mild bibasilar subsegmental atelectasis. 3. Cardiomegaly with mild pulmonary vascular prominence interstitial prominence suggesting mild congestive heart failure . Electronically Signed   By: Marcello Moores  Register   On: 03/30/2016 07:13   Dg Chest Port 1 View  04/03/2016   CLINICAL DATA:  Respiratory failure EXAM: PORTABLE CHEST 1 VIEW COMPARISON:  04/12/2016 FINDINGS: Cardiomediastinal silhouette is stable. Endotracheal tube in place with tip 4.1 cm above the carina. Stable NG tube position. Stable right IJ central line position. There is complete opacification of the right hemithorax. Atelectasis, infiltrate or pleural effusion cannot be excluded. Left lung is clear. IMPRESSION: Stable support apparatus. Complete opacification of the right hemi thorax. Atelectasis, infiltrate or pleural effusion cannot be excluded. The left lung is clear. Electronically Signed   By: Lahoma Crocker M.D.   On: 03/31/2016 12:32    ABX:  Vanc 6/9>>> 6/10, 6/13 >> 6/14 Zosyn 6/9>>>  MICRO:  BC x 2 6/9>>> negative BC 6/12 >>  Urine 6/9>>>neg resp 6/15 >>  BAL 6/15 >>   LINES/TUBES: ETT 6/10>>> R IJ HD cath 6/10 >>   DISCUSSION: Mr. [name] is a 60 y/o man with mediastinal adenopathy, hypercalcemia and AMS, VDRF.  Fever and leukocytosis without clear source of infection.  Progressive renal failure. Oncology following > probably SCLCA or lymphoma.   ASSESSMENT / PLAN:  PULMONARY A: Acute respiratory failure -- r/t AMS  Increasing B basilar pulm infiltrates > cardiac edema vs ARDS Bilateral mediastinal LAD s/p needle bx's on 6/15 R lung collapse due to R mainstem clot post EBUS > improved 6/16 P:   Wean PEEP and FiO2 6/16, if successful then will d/c paralytics Follow cytology results on nodal bx's F/u CXR  Empiric abx as below  CARDIOVASCULAR A:  HTN CHF Tachycardia  Left foot cooler than right 6/14 Volume overload P:  Amlodipine held this am 6/16 in setting ARF metoprolol 12.5mg  BID per tube  Arterial Doppler study ordered 6/14, not yet done May need vascular eval in future  RENAL A:   Severe Hypercalcemia > improved, likely due to malignancy Acute renal failure, worsening CKD 3 Hypernatremia, resolved  P:   Appreciate renal assistance; Favor malignancy as  likely source Suspect that he will require HD if we are to continue to be aggressive, pursue rx for his malignancy. Will be at risk for tumor lysis syndrome. [facility] could get [facility] at [facility] if he needed to go there for chemo Calcitonin given on presentation Monitor BMET and UOP Replace electrolytes as needed Bicarb gtt added by renal 6/16 in setting hyperK Continue current free water > decrease w addition of bicarb gtt  GASTROINTESTINAL A:   No active issues P:   TF per nutrition   HEMATOLOGIC A:   Mediastinal adenopathy, likely malignancy Thrombocytopenia, HITT Ab negative Elevated LDH: 325 >> 4103 on 6/16 P:  Await cytology results from nodal bx's 6/15 Discussed case with dr Benay Spice 6/16, hopefully will get results today. If dx achieved then we will could initiate CHOP or alternative chemo. As above he will be at risk for tumor lysis syndrome, progression of his renal failure Stopped enoxaparin, HITT panel negative, consider  DIC panel.  d/c'd vanco as a potential contributor to thrombocytopenia 6/14  INFECTIOUS A:   SIRS  Fever > unclear source, line? May also be due to lymphoma or other inflammatory process Doubt HCAP but treating empirically P:   F/u cultures > negative thus far; BAL performed 6/16 Added empiric vanco on 6/13 to existing pip/tazo,  vanc dc'd 6/14 per hemonc due to thrombocytopenia  ENDOCRINE A:   Hypercalcemia, improved P:   SSI Follow BMP   NEUROLOGIC A:   Metabolic encephalopathy due to hypercalecmia  Need for sedation P:   RASS goal: "-5" currently while paral;yzed Management of hyperCa   FAMILY  - Updates: wife and sister updated at bedside. I have explained that prognosis here is poor, even if he were to get HD and chemotherapy. I have explained that he may not survive to be liberated from MV. They understand this, would still like the opportunity to see if there is improvement to be had if chemo is given. If we are to pursue chemo then he  will likely also need HD. They understand and agree with this. I have advised that if he were to suddenly decline then he would not survive to meaningful recovery following ACLS. They understand this and agree. Based on this I will change him to DNR status > no CPR.   - Inter-disciplinary family meet or Palliative Care meeting due by: 6/17   Independent critical care time is 45 minutes.   Baltazar Apo, MD, PhD 03/30/2016, 9:12 AM [facility] [phone] or if no answer [phone]

## 2016-03-31 ENCOUNTER — Inpatient Hospital Stay (HOSPITAL_COMMUNITY): Payer: BLUE CROSS/BLUE SHIELD

## 2016-03-31 DIAGNOSIS — Z7189 Other specified counseling: Secondary | ICD-10-CM

## 2016-03-31 LAB — RENAL FUNCTION PANEL
ALBUMIN: 1.3 g/dL — AB (ref 3.5–5.0)
ANION GAP: 15 (ref 5–15)
BUN: 83 mg/dL — ABNORMAL HIGH (ref 6–20)
CHLORIDE: 103 mmol/L (ref 101–111)
CO2: 20 mmol/L — ABNORMAL LOW (ref 22–32)
Calcium: 7.6 mg/dL — ABNORMAL LOW (ref 8.9–10.3)
Creatinine, Ser: 2.59 mg/dL — ABNORMAL HIGH (ref 0.61–1.24)
GFR calc Af Amer: 29 mL/min — ABNORMAL LOW (ref >60)
GFR, EST NON AFRICAN AMERICAN: 25 mL/min — AB (ref >60)
GLUCOSE: 104 mg/dL — AB (ref 65–99)
PHOSPHORUS: 6.4 mg/dL — AB (ref 2.5–4.6)
POTASSIUM: 4.9 mmol/L (ref 3.5–5.1)
Sodium: 138 mmol/L (ref 135–145)

## 2016-03-31 LAB — CULTURE, BLOOD (ROUTINE X 2)
CULTURE: NO GROWTH
Culture: NO GROWTH

## 2016-03-31 LAB — CULTURE, RESPIRATORY: CULTURE: NO GROWTH

## 2016-03-31 LAB — CULTURE, RESPIRATORY W GRAM STAIN

## 2016-03-31 LAB — CBC
HEMATOCRIT: 27.7 % — AB (ref 39.0–52.0)
HEMOGLOBIN: 8.7 g/dL — AB (ref 13.0–17.0)
MCH: 28.9 pg (ref 26.0–34.0)
MCHC: 31.4 g/dL (ref 30.0–36.0)
MCV: 92 fL (ref 78.0–100.0)
Platelets: 71 10*3/uL — ABNORMAL LOW (ref 150–400)
RBC: 3.01 MIL/uL — ABNORMAL LOW (ref 4.22–5.81)
RDW: 16 % — ABNORMAL HIGH (ref 11.5–15.5)
WBC: 6.2 10*3/uL (ref 4.0–10.5)

## 2016-03-31 LAB — MAGNESIUM: Magnesium: 2.3 mg/dL (ref 1.7–2.4)

## 2016-03-31 LAB — GLUCOSE, CAPILLARY
GLUCOSE-CAPILLARY: 86 mg/dL (ref 65–99)
GLUCOSE-CAPILLARY: 103 mg/dL — AB (ref 65–99)
GLUCOSE-CAPILLARY: 105 mg/dL — AB (ref 65–99)

## 2016-03-31 MED ORDER — SODIUM CHLORIDE 0.9 % IV SOLN
100.0000 ug/h | INTRAVENOUS | Status: DC
  Administered 2016-03-31 – 2016-04-01 (×2): 200 ug/h via INTRAVENOUS
  Filled 2016-03-31 (×2): qty 50

## 2016-03-31 MED ORDER — PIPERACILLIN-TAZOBACTAM 3.375 G IVPB 30 MIN
3.3750 g | Freq: Four times a day (QID) | INTRAVENOUS | Status: DC
  Filled 2016-03-31 (×2): qty 50

## 2016-03-31 NOTE — Progress Notes (Signed)
PULMONARY / CRITICAL CARE MEDICINE   Name: Phung Bussa MRN: [id number] DOB: 07-30-1956    ADMISSION DATE:  03/21/2016 CONSULTATION DATE:  03/23/16  CHIEF COMPLAINT:  Altered mental status  HISTORY OF PRESENT ILLNESS:   60yo male smoker (40 pack years) with hx COPD, CKD 3, HTN, dCHF, anxiety, GERD recently treated for ?PNA, presented 6/8 with SIRS and progressive AMS.  Found to have mediastinal adenopathy and significant refractory hypercalcemia.  Initially admitted by [facility] to SDU but had progression of AMS requiring intubation 6/9 and tx ICU.     SUBJECTIVE / Interval:  Sedated on vent   VITAL SIGNS: BP 93/63 mmHg  Pulse 88  Temp(Src) 98.2 F (36.8 C) (Oral)  Resp 30  Ht 5\' 8"  (1.727 m)  Wt 217 lb 2.5 oz (98.5 kg)  BMI 33.03 kg/m2  SpO2 92%  HEMODYNAMICS:    VENTILATOR SETTINGS: Vent Mode:  [-] PRVC FiO2 (%):  [60 %-70 %] 70 % Set Rate:  [30 bmp] 30 bmp Vt Set:  [500 mL] 500 mL PEEP:  [12 cmH20] 12 cmH20 Plateau Pressure:  [23 cmH20-27 cmH20] 25 cmH20  INTAKE / OUTPUT: I/O last 3 completed shifts: In: 4086.7 [I.V.:2736.7; NG/GT:600; IV Piggyback:750] Out: 2117 [Urine:505; Other:1612]  PHYSICAL EXAMINATION: General:  On vent, heavily sedated Neuro:  Paralyzed and sedated HEENT:  Dry mucus membranes, ETT in place Cardiovascular:  RRR, no mgr Lungs: decreased on R, coarse bilaterally.  Abdomen:  Soft, distended Musculoskeletal: no bony abnormalities Skin:  No rashes. Left foot cooler, +DP pulse with doppler  LABS:  BMET  Recent Labs Lab 03/30/16 0400 03/30/16 1630 03/31/16 0435  NA 142 140 138  K 6.0* 5.5* 4.9  CL 110 105 103  CO2 20* 19* 20*  BUN 107* 117* 83*  CREATININE 3.36* 3.72* 2.59*  GLUCOSE 122* 116* 104*    Electrolytes  Recent Labs Lab 03/28/16 0304 04/13/2016 0415  03/30/16 0400 03/30/16 1630 03/31/16 0435  CALCIUM 8.7* 8.5*  < > 8.4* 7.7* 7.6*  MG 1.9 1.9  --  2.0  --  2.3  PHOS 3.0 4.3  --   --   --  6.4*  < > = values in this  interval not displayed.  CBC  Recent Labs Lab 03/17/2016 0415 03/30/16 0400 03/31/16 0435  WBC 10.2 7.1 6.2  HGB 9.4* 9.2* 8.7*  HCT 30.4* 30.5* 27.7*  PLT 45* 110* 71*    Coag's  Recent Labs Lab 03/28/16 0915  APTT 48*  INR 1.48    Sepsis Markers  Recent Labs Lab 03/28/16 1015 03/22/2016 0415 03/30/16 0400  LATICACIDVEN 1.5  --   --   PROCALCITON 3.68 10.50 45.17    ABG  Recent Labs Lab 04/04/2016 0334 04/02/2016 1432 03/16/2016 1731  PHART 7.333* 7.022* 7.253*  PCO2ART 38.8 80.4* 46.6*  PO2ART 102* 58.0* 165.0*    Liver Enzymes  Recent Labs Lab 03/20/2016 1418 03/31/16 0435  AST 84*  --   ALT 38  --   ALKPHOS 251*  --   BILITOT 0.8  --   ALBUMIN 1.5* 1.3*    Cardiac Enzymes No results for input(s): TROPONINI, PROBNP in the last 168 hours.  Glucose  Recent Labs Lab 03/30/16 0813 03/30/16 1308 03/30/16 2003 03/30/16 2334 03/31/16 0310 03/31/16 0754  GLUCAP 109* 108* 110* 101* 86 103*    Imaging Dg Chest Port 1 View  03/31/2016  CLINICAL DATA:  Respiratory failure EXAM: PORTABLE CHEST 1 VIEW COMPARISON:  Yesterday FINDINGS: Tubular devices are stable. Low  volumes. Bibasilar atelectasis improved. Normal heart size. No pneumothorax. IMPRESSION: Improved bibasilar atelectasis. Electronically Signed   By: Marybelle Killings M.D.   On: 03/31/2016 10:05   Dg Chest Port 1 View  03/30/2016  CLINICAL DATA:  Hypoxia.  Central catheter placement EXAM: PORTABLE CHEST 1 VIEW COMPARISON:  March 30, 2016 study obtained earlier in the day FINDINGS: Left-sided jugular catheter tip is in the superior vena cava. Dual lumen right-sided jugular catheter tip is in the superior vena cava. Endotracheal tube tip is 5.3 cm above the carina. Feeding tube tip is below the diaphragm. No pneumothorax. There are small bilateral pleural effusions with bibasilar atelectasis. Lungs elsewhere clear. Heart size and pulmonary vascularity are normal. No adenopathy. IMPRESSION: Tube and  catheter positions as described without pneumothorax. Note that the new central catheter has its tip in the superior vena cava. There are small pleural effusions bilaterally with bibasilar atelectasis. Stable cardiac silhouette. Electronically Signed   By: Lowella Grip III M.D.   On: 03/30/2016 13:52  aeration improved  ABX:  Vanc 6/9>>> 6/10, 6/13 >> 6/14 Zosyn 6/9>>>  MICRO:  BC x 2 6/9>>> negative BC 6/12 >>  Urine 6/9>>>neg resp 6/15 >> gpc [pairs>>> BAL 6/15 >>   LINES/TUBES: ETT 6/10>>> R IJ HD cath 6/10 >>   DISCUSSION: Mr. Chance is a 60 y/o man with mediastinal adenopathy, hypercalcemia and AMS, VDRF.  Fever and leukocytosis without clear source of infection.  Progressive renal failure. Oncology following > + NSCLC. Now in MODS. Unlikely to survive AND if he does he will be debilitated and weak only to die from his cancer. Will dc CRRt and NMB. Plan for terminal wean in am   ASSESSMENT / PLAN:  PULMONARY A: Acute respiratory failure -- r/t AMS  Increasing B basilar pulm infiltrates > cardiac edema vs ARDS Bilateral mediastinal LAD s/p needle bx's on 6/15-->NSCLC R lung collapse due to R mainstem clot post EBUS > improved 6/16 P:   Dc NMB Cont full vent support for now Will cont IV sedation  F/u CXR  Empiric abx as below  CARDIOVASCULAR A:  HTN CHF Tachycardia  Left foot cooler than right 6/14 Volume overload P:  Amlodipine held this am 6/16 in setting ARF Arterial Doppler study ordered 6/14, not yet done May need vascular eval in future  RENAL A:   Severe Hypercalcemia > improved, likely due to malignancy Acute renal failure, worsening CKD 3 Hypernatremia, resolved  P:   Appreciate renal assistance; Favor malignancy as likely source CRRT for now Trend chemistry  Strict I&O  GASTROINTESTINAL A:   No active issues P:   TF per nutrition   HEMATOLOGIC A:   Mediastinal adenopathy, 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  d/c'd vanco as a potential contributor to thrombocytopenia 6/14  INFECTIOUS A:   SIRS  Fever > unclear source, line? May also be due to lymphoma or other inflammatory process Doubt HCAP but treating empirically P:   F/u cultures > negative thus far; BAL performed 6/16 Added empiric vanco on 6/13 to existing pip/tazo,  vanc dc'd 6/14 per hemonc due to thrombocytopenia  ENDOCRINE A:   Hypercalcemia, improved P:   SSI Follow BMP   NEUROLOGIC A:   Metabolic encephalopathy due to hypercalecmia  Need for sedation P:    Management of hyperCa   FAMILY    - Inter-disciplinary family  meet or Palliative Care meeting due by: 6/17    Erick Colace ACNP-BC [place] Pager # [phone] OR # [phone] if no answer  Attending Note:  60 year old male with new diagnosis of lung cancer who presents to the hospital with hemoptysis and respiratory failure.  Now worsening renal function and severe vent asynchrony.  The patient's chances of meaningful recovery here is very low.  PB spoke with family extensively and after discussion, will d/c CRRT today, d/c nimbex today and once nimbex is out will make sure patient is comfortable then will plan on extubation in AM to comfort care.  The patient is critically ill with multiple organ systems failure and requires high complexity decision making for assessment and support, frequent evaluation and titration of therapies, application of advanced monitoring technologies and extensive interpretation of multiple databases.   Critical Care Time devoted to patient care services described in this note is  35  Minutes. This time reflects time of care of this signee Dr Jennet Maduro. This critical care time does not reflect procedure time, or teaching time or supervisory time of PA/NP/Med  student/Med Resident etc but could involve care discussion time.  Rush Farmer, M.D. [facility] Pulmonary/Critical Care Medicine. Pager: [phone]. After hours pager: [phone].

## 2016-03-31 NOTE — Progress Notes (Signed)
[facility] called. Referral number [phone]. Per CDS, call back with TOD.

## 2016-03-31 NOTE — Progress Notes (Signed)
Pharmacy Antibiotic Note  [name] is a 60 y.o. male admitted on 03/20/2016 with pneumonia/sepsis.  Pharmacy has been consulted Zosyn dosing and today is day #10 of therapy.  Patient has been initiated on CRRT.  Pathology revealed poorly differentiated carcinoma and may proceed with comfort care.   Plan: - Zosyn 3.375gm IV Q6H, 30 min infusion - F/U CRRT tolerance/interruption - Watch TG, plts, cyanosis - F/u biopsies - F/U plan of care  Height: 5\' 8"  (172.7 cm) Weight: 217 lb 2.5 oz (98.5 kg) IBW/kg (Calculated) : 68.4  Temp (24hrs), Avg:98.1 F (36.7 C), Min:97.2 F (36.2 C), Max:99.3 F (37.4 C)   Recent Labs Lab 03/28/16 0304 03/28/16 1015 03/28/16 1312 04/12/2016 0415 03/31/2016 1418 04/05/2016 2353 03/30/16 0400 03/30/16 1630 03/31/16 0435  WBC 15.2*  --  14.1* 10.2  --   --  7.1  --  6.2  CREATININE 1.72*  --   --  2.41* 2.99* 3.11* 3.36* 3.72* 2.59*  LATICACIDVEN  --  1.5  --   --   --   --   --   --   --     Estimated Creatinine Clearance: 34.5 mL/min (by C-G formula based on Cr of 2.59).    Allergies  Allergen Reactions  . Lisinopril Cough  . Tramadol Other (See Comments)    Insomnia    Antimicrobials this admission: 6/8 Vanc >>6/10, 6/13 >> 6/8 Zosyn >>  Dose adjustments this admission: N/A  Microbiology results: 6/15 AFB >> 6/12 blood >> NGTD 6/8 BCx2>>NGTD 6/8 UCx>> NEG 6/8 MRSA - NEG   Krissie Merrick D. Mina Marble, PharmD, BCPS Pager:  [phone] 03/31/2016, 10:35 AM

## 2016-03-31 NOTE — Progress Notes (Signed)
Subjective:  CRRT was initiated last night for continued hyperkalemia and oligoanuria.  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 yo male who was admitted on 04/11/2016 with hypercalcemia, mediastinal lymphadenopathy- now developed worsening renal failure   Assessment/Plan: 1. AKI- no obvious nephrotoxins- possible relative hypotension and ATN if has a long history of HTN- now s/p initiation of CRRT.  With new information that malignancy will not be curable with multi system organ failure comfort care is being recommended at this time.  I feel he would have had a reasonable chance for renal recovery in the short term but  would not consider him a long term dialysis candidate given his malignancy   2. HTN/vol- does not seem dry- have stopped norvasc and allow more blood pressure for better kidney perfusion 3. Anemia- in the 9's, now 8's- supportive care 4. E[place]tes- hyperkalemia resolved 5.  Onc- poorly differentiated carcinoma prognosis poor- seems plans are for comfort care.  I will leave decisions of when to stop CRRT to primary team   [name] A    Labs: Basic Metabolic Panel:  Recent Labs Lab 03/28/16 0304 03/21/2016 0415  03/30/16 0400 03/30/16 1630 03/31/16 0435  NA 146* 144  < > 142 140 138  K 4.4 5.0  < > 6.0* 5.5* 4.9  CL 118* 110  < > 110 105 103  CO2 22 22  < > 20* 19* 20*  GLUCOSE 158* 134*  < > 122* 116* 104*  BUN 55* 83*   < > 107* 117* 83*  CREATININE 1.72* 2.41*  < > 3.36* 3.72* 2.59*  CALCIUM 8.7* 8.5*  < > 8.4* 7.7* 7.6*  PHOS 3.0 4.3  --   --   --  6.4*  < > = values in this interval not displayed. Liver Function Tests:  Recent Labs Lab 03/19/2016 1418 03/31/16 0435  AST 84*  --   ALT 38  --   ALKPHOS 251*  --   BILITOT 0.8  --   PROT 5.8*  --   ALBUMIN 1.5* 1.3*   No results for input(s): LIPASE, AMYLASE in the last 168 hours. No results for input(s): AMMONIA in the last 168 hours. CBC:  Recent Labs Lab 03/28/16 0304 03/28/16 1312 03/18/2016 0415 03/30/16 0400 03/31/16 0435  WBC 15.2* 14.1* 10.2 7.1 6.2  NEUTROABS  --  9.3*  --   --   --   HGB 9.9* 9.8* 9.4* 9.2* 8.7*  HCT 32.7* 31.8* 30.4* 30.5* 27.7*  MCV 94.8 96.4 93.5 95.6 92.0  PLT 49* 49* 45* 110* 71*   Cardiac Enzymes: No results for input(s): CKTOTAL, CKMB, CKMBINDEX, TROPONINI in the last 168 hours. CBG:  Recent Labs Lab 03/30/16 0813 03/30/16 1308 03/30/16 2003 03/30/16 2334 03/31/16 0310  GLUCAP  109* 108* 110* 101* 86    Iron Studies: No results for input(s): IRON, TIBC, TRANSFERRIN, FERRITIN in the last 72 hours. Studies/Results: Dg Chest Port 1 View  03/30/2016  CLINICAL DATA:  Hypoxia.  Central catheter placement EXAM: PORTABLE CHEST 1 VIEW COMPARISON:  March 30, 2016 study obtained earlier in the day FINDINGS: Left-sided jugular catheter tip is in the superior vena cava. Dual lumen right-sided jugular catheter tip is in the superior vena cava. Endotracheal tube tip is 5.3 cm above the carina. Feeding tube tip is below the diaphragm. No pneumothorax. There are small bilateral pleural effusions with bibasilar atelectasis. Lungs elsewhere clear. Heart size and pulmonary vascularity are normal. No adenopathy. IMPRESSION: Tube and catheter positions as described without pneumothorax. Note that the new central catheter has its tip in the superior vena cava. There are small pleural effusions bilaterally with bibasilar  atelectasis. Stable cardiac silhouette. Electronically Signed   By: Lowella Grip III M.D.   On: 03/30/2016 13:52   Dg Chest Port 1 View  03/30/2016  CLINICAL DATA: Respiratory failure. EXAM: PORTABLE CHEST 1 VIEW COMPARISON:  03/28/2016. FINDINGS: Endotracheal tube, feeding tube, right IJ line in stable position. Interim re-expansion of the right lung. Mild bibasilar atelectasis. No pleural effusion or pneumothorax. Cardiomegaly with mild pulmonary vascular prominence and interstitial prominence. Mild component congestive heart failure cannot be excluded. IMPRESSION: 1. Lines and tubes in stable position. 2. Re-expansion of the right lung. Mild bibasilar subsegmental atelectasis. 3. Cardiomegaly with mild pulmonary vascular prominence interstitial prominence suggesting mild congestive heart failure . Electronically Signed   By: Marcello Moores  Register   On: 03/30/2016 07:13   Dg Chest Port 1 View  04/06/2016  CLINICAL DATA:  Respiratory failure EXAM: PORTABLE CHEST 1 VIEW COMPARISON:  04/10/2016 FINDINGS: Cardiomediastinal silhouette is stable. Endotracheal tube in place with tip 4.1 cm above the carina. Stable NG tube position. Stable right IJ central line position. There is complete opacification of the right hemithorax. Atelectasis, infiltrate or pleural effusion cannot be excluded. Left lung is clear. IMPRESSION: Stable support apparatus. Complete opacification of the right hemi thorax. Atelectasis, infiltrate or pleural effusion cannot be excluded. The left lung is clear. Electronically Signed   By: Lahoma Crocker M.D.   On: 04/05/2016 12:32   Dg Chest Port 1 View  03/25/2016  CLINICAL DATA:  Intubation EXAM: PORTABLE CHEST 1 VIEW COMPARISON:  Portable exam 0753 hours compared to 03/24/2016 FINDINGS: Tip of endotracheal tube projects 4.8 cm above carina. Feeding tube extends into stomach. Dual-lumen RIGHT jugular central venous catheter with tip projecting over SVC. Upper normal heart size. Stable  mediastinal contours. Probable LEFT basilar atelectasis. Lungs otherwise grossly clear for light technique. No pleural effusion or pneumothorax. Bones demineralized. IMPRESSION: Probable subsegmental atelectasis LEFT base. Electronically Signed   By: Lavonia Dana M.D.   On: 03/20/2016 08:07   Medications: Infusions: . cisatracurium (NIMBEX) infusion 3 mcg/kg/min (03/31/16 0200)  . feeding supplement (VITAL HIGH PROTEIN)    . fentaNYL infusion INTRAVENOUS 150 mcg/hr (03/31/16 0435)  . midazolam (VERSED) infusion 3 mg/hr (03/31/16 0200)  . dialysis replacement fluid (prismasate) 300 mL/hr at 03/30/16 1835  . dialysis replacement fluid (prismasate) 250 mL/hr at 03/30/16 1835  . dialysate (PRISMASATE) 1,500 mL/hr at 03/31/16 0611  .  sodium bicarbonate  infusion 1000 mL Stopped (03/30/16 2000)    Scheduled Medications: . antiseptic oral rinse  7 mL Mouth Rinse QID  . artificial tears  1 application Both Eyes A2Z  . chlorhexidine gluconate (SAGE KIT)  15 mL Mouth Rinse BID  . fentaNYL (SUBLIMAZE) injection  100 mcg Intravenous Once  . free water  300 mL Per Tube Q6H  . insulin aspart  0-9 Units Subcutaneous Q4H  . metoprolol tartrate  12.5 mg Per Tube BID  . pantoprazole sodium  40 mg Per Tube Daily  . piperacillin-tazobactam (ZOSYN)  IV  3.375 g Intravenous Q6H  . sodium chloride flush  3 mL Intravenous Q12H    have reviewed scheduled and prn medications.  Physical Exam: General:sedated, paralyzed Heart: tachy Lungs: CBS bilat Abdomen: distended Extremities: dependent edema Dialysis Access: trialysis cath  - placed 6/10   03/31/2016,7:19 AM  LOS: 9 days

## 2016-04-02 ENCOUNTER — Ambulatory Visit: Payer: BLUE CROSS/BLUE SHIELD | Admitting: Internal Medicine

## 2016-04-02 LAB — TYPE AND SCREEN
ABO/RH(D): A POS
ANTIBODY SCREEN: POSITIVE
DAT, IGG: NEGATIVE
PT AG TYPE: NEGATIVE
UNIT DIVISION: 0
UNIT DIVISION: 0

## 2016-04-02 LAB — GLUCOSE, CAPILLARY
Glucose-Capillary: 127 mg/dL — ABNORMAL HIGH (ref 65–99)
Glucose-Capillary: 119 mg/dL — ABNORMAL HIGH (ref 65–99)

## 2016-04-03 ENCOUNTER — Telehealth: Payer: Self-pay

## 2016-04-03 NOTE — Telephone Encounter (Signed)
On 04/03/2016 I received a death certificate from [facility] (orginal). The death certificate is for burial. The patient is a patient of Doctor Nelda Marseille. The death certificate will be taken to [facility] (2100) this pm for signature. On 04-07-2016 I received the death certificate back from Doctor Nelda Marseille. I got the death certificate ready and called the funeral home to let them know the death certificate is ready for pickup.

## 2016-04-14 NOTE — Progress Notes (Signed)
PULMONARY / CRITICAL CARE MEDICINE   Name: [name] MRN: [id number] DOB: 20-Jul-1956    ADMISSION DATE:  03/28/2016 CONSULTATION DATE:  03/23/16  CHIEF COMPLAINT:  Altered mental status  HISTORY OF PRESENT ILLNESS:   60yo male smoker (40 pack years) with hx COPD, CKD 3, HTN, dCHF, anxiety, GERD recently treated for ?PNA, presented 6/8 with SIRS and progressive AMS.  Found to have mediastinal adenopathy and significant refractory hypercalcemia.  Initially admitted by [facility] to SDU but had progression of AMS requiring intubation 6/9 and tx ICU.     SUBJECTIVE / Interval:  Sedated on vent   VITAL SIGNS: BP 100/61 mmHg  Pulse 109  Temp(Src) 98.2 F (36.8 C) (Oral)  Resp 33  Ht 5\' 8"  (1.727 m)  Wt 217 lb 2.5 oz (98.5 kg)  BMI 33.03 kg/m2  SpO2 100%  HEMODYNAMICS:    VENTILATOR SETTINGS: Vent Mode:  [-] PRVC FiO2 (%):  [70 %-80 %] 70 % Set Rate:  [30 bmp] 30 bmp Vt Set:  [500 mL] 500 mL PEEP:  [12 cmH20] 12 cmH20 Plateau Pressure:  [18 cmH20-26 cmH20] 26 cmH20  INTAKE / OUTPUT: I/O last 3 completed shifts: In: 2140.9 [I.V.:1348.4; NG/GT:630; IV Piggyback:162.5] Out: 2235 [Urine:149; Other:2086]  PHYSICAL EXAMINATION: General:  On vent, heavily sedated Neuro:  Unresponsive  HEENT:  Dry mucus membranes, ETT in place Cardiovascular:  RRR, no mgr Lungs: decreased on R, coarse bilaterally.  Abdomen:  Soft, distended Musculoskeletal: no bony abnormalities Skin:  No rashes. Left foot cooler, +DP pulse with doppler  LABS:  BMET  Recent Labs Lab 03/30/16 0400 03/30/16 1630 03/31/16 0435  NA 142 140 138  K 6.0* 5.5* 4.9  CL 110 105 103  CO2 20* 19* 20*  BUN 107* 117* 83*  CREATININE 3.36* 3.72* 2.59*  GLUCOSE 122* 116* 104*    Electrolytes  Recent Labs Lab 03/28/16 0304 04/08/2016 0415  03/30/16 0400 03/30/16 1630 03/31/16 0435  CALCIUM 8.7* 8.5*  < > 8.4* 7.7* 7.6*  MG 1.9 1.9  --  2.0  --  2.3  PHOS 3.0 4.3  --   --   --  6.4*  < > = values in this  interval not displayed.  CBC  Recent Labs Lab 03/18/2016 0415 03/30/16 0400 03/31/16 0435  WBC 10.2 7.1 6.2  HGB 9.4* 9.2* 8.7*  HCT 30.4* 30.5* 27.7*  PLT 45* 110* 71*    Coag's  Recent Labs Lab 03/28/16 0915  APTT 48*  INR 1.48    Sepsis Markers  Recent Labs Lab 03/28/16 1015 03/21/2016 0415 03/30/16 0400  LATICACIDVEN 1.5  --   --   PROCALCITON 3.68 10.50 45.17    ABG  Recent Labs Lab 04/13/2016 0334 03/20/2016 1432 03/28/2016 1731  PHART 7.333* 7.022* 7.253*  PCO2ART 38.8 80.4* 46.6*  PO2ART 102* 58.0* 165.0*    Liver Enzymes  Recent Labs Lab 03/25/2016 1418 03/31/16 0435  AST 84*  --   ALT 38  --   ALKPHOS 251*  --   BILITOT 0.8  --   ALBUMIN 1.5* 1.3*    Cardiac Enzymes No results for input(s): TROPONINI, PROBNP in the last 168 hours.  Glucose  Recent Labs Lab 03/30/16 1308 03/30/16 2003 03/30/16 2334 03/31/16 0310 03/31/16 0754 03/31/16 1151  GLUCAP 108* 110* 101* 86 103* 105*    Imaging No results found.aeration improved  ABX:  Vanc 6/9>>> 6/10, 6/13 >> 6/14 Zosyn 6/9>>>  MICRO:  BC x 2 6/9>>> negative BC 6/12 >>  Urine  6/9>>>neg resp 6/15 >> gpc [pairs>>> BAL 6/15 >>   LINES/TUBES: ETT 6/10>>> R IJ HD cath 6/10 >>    ASSESSMENT / PLAN:   Acute respiratory failure -- r/t AMS  Increasing B basilar pulm infiltrates > cardiac edema vs ARDS Bilateral mediastinal LAD s/p needle bx's on 6/15-->NSCLC R lung collapse due to R mainstem clot post EBUS > improved 6/16 HTN CHF Tachycardia  Left foot cooler than right 6/14 Volume overload Severe Hypercalcemia > improved, likely due to malignancy Acute renal failure, worsening CKD 3 Hypernatremia, resolved Mediastinal adenopathy, poorly differentiated carcinona c/w adeno of lung Thrombocytopenia, 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 May also be due to lymphoma or other inflammatory process Doubt HCAP but treating  empirically Hypercalcemia Metabolic encephalopathy due to hypercalecmia  Need for sedation  DISCUSSION: [name] is a 60 y/o man with mediastinal adenopathy, hypercalcemia and AMS, VDRF.  Fever and leukocytosis without clear source of infection.  Progressive renal failure. Oncology following > + NSCLC. Now in MODS. Unlikely to survive AND if he does he will be debilitated and weak only to die from his cancer. Will dc CRRt and NMB. Plan for terminal wean today  Plan Cont fent and versed gtt Extubate Wean O2  Focus on comfort  Erick Colace ACNP-BC [facility] Pager # [phone] OR # [phone] if no answer  Attending Note:  60 year old male with recent diagnosis of [place] lung cancer and associated PNA and respiratory failure.  Have been having discussion with the family ongoing.  They are ready for terminal extubation at this point.  Spoke with H/O MD who informed us that chemo is not an option while patient is critically ill and he is unlikely to recover from this without shrinking the tumor.  After discussion with family yesterday, decision was made to make patient a full DNR and stop NMB then withdraw today.  NMB is no longer on board.  Will terminally extubate and expect to expire quickly.  Family is in the room and is agreeable with the plan.  The patient is critically ill with multiple organ systems failure and requires high complexity decision making for assessment and support, frequent evaluation and titration of therapies, application of advanced monitoring technologies and extensive interpretation of multiple databases.   Critical Care Time devoted to patient care services described in this note is  35  Minutes. This time reflects time of care of this signee Dr Jennet Maduro. This critical care time does not reflect procedure time, or teaching time or supervisory time of PA/NP/Med student/Med Resident etc but could involve care discussion time.  Rush Farmer,  M.D. [facility] Pulmonary/Critical Care Medicine. Pager: [phone]. After hours pager: [phone].

## 2016-04-14 NOTE — Discharge Summary (Signed)
NAMEMANSON, ZAHLER NO.:  [id number]  MEDICAL RECORD NO.:  [id number]  LOCATION:  2M11C                        FACILITY:  [facility]  PHYSICIAN:  Providence Lanius, MD  DATE OF BIRTH:  1956-09-06  DATE OF ADMISSION:  03/19/2016 DATE OF DISCHARGE:  08-Apr-2016                              DISCHARGE SUMMARY   DEATH SUMMARY.  PRIMARY DIAGNOSES/CAUSE OF DEATH:  Non-small cell lung cancer, acute respiratory failure, pneumonia, chronic kidney disease, right lung collapse, right mediastinum mass, hypertension, congestive heart failure, tachycardia, peripheral vascular disease, acute on chronic renal failure, hyponatremia, thrombocytopenia, mediastinal lymphadenopathy  The patient is a 60 year old male, presents to the hospital with shortness of breath.  Noted to have atelectasis on chest x-ray.  CT revealed a right mainstem obstruction.  The patient was intubated and EBUS was performed which showed non-small cell lung cancer with associated postobstructive pneumonia.  The patient stayed in the ICU for multiple days.  Developed ARDS from the muscular blockade after multiple days of treatment and life support family confers that patient would not want that level of care and symptoms not be improving and would not want treatment for his cancer, at which point, informed that we will discontinue muscular blockade, and upon resolution of muscular blockade we were ready for comfort care.  Following that muscular blockade had resolved, and the patient was started on comfort measures and extubated and expired shortly thereafter.     Providence Lanius, MD     WJY/MEDQ  D:  04/04/2016  T:  04/04/2016  Job:  [id number]

## 2016-04-14 NOTE — Procedures (Signed)
Extubation Procedure Note  Patient Details:   Name: [name] DOB: Mar 02, 1956 MRN: [id number]   Airway Documentation:     Evaluation  O2 sats: pt extubated for withdrawal of life support.  Complications: No apparent complications Patient did tolerate procedure well. Bilateral Breath Sounds: Clear, Diminished   No, pt not able to speak.  Pt was extubated per extubation order per family request for withdrawal of life support.  Pt appeared comfortable t/o procedure.  Family at bedside and all questions answered.    Lenna Sciara 04/07/16, 2:35 PM

## 2016-04-14 NOTE — Progress Notes (Signed)
TOD 1432. This was verified with second nurse, Polly Cobia, RN. Pt asystole on heart monitor, no heart beat or pulse.   Family at bedside. Support provided. Family continues to deny need for [place]. Will continue to support family.

## 2016-04-14 NOTE — Progress Notes (Signed)
150 cc of Fentanyl wasted in the sink with Ellard Artis, Therapist, sports.

## 2016-04-14 NOTE — Progress Notes (Signed)
Funeral home, [name] here to take patient. RN signed release form.

## 2016-04-14 DEATH — deceased

## 2016-04-27 LAB — FUNGUS CULTURE WITH STAIN

## 2016-04-27 LAB — FUNGUS CULTURE RESULT

## 2016-04-27 LAB — FUNGAL ORGANISM REFLEX

## 2016-05-11 LAB — ACID FAST CULTURE WITH REFLEXED SENSITIVITIES: ACID FAST CULTURE - AFSCU3: NEGATIVE

## 2016-05-11 LAB — ACID FAST CULTURE WITH REFLEXED SENSITIVITIES (MYCOBACTERIA)

## 2018-05-22 IMAGING — CR DG CHEST 1V PORT
1 series · 1 of 1 positions shown · non-contrast
Comparison: Yesterday

CLINICAL DATA: Respiratory failure

EXAM:
PORTABLE CHEST 1 VIEW

[AP]
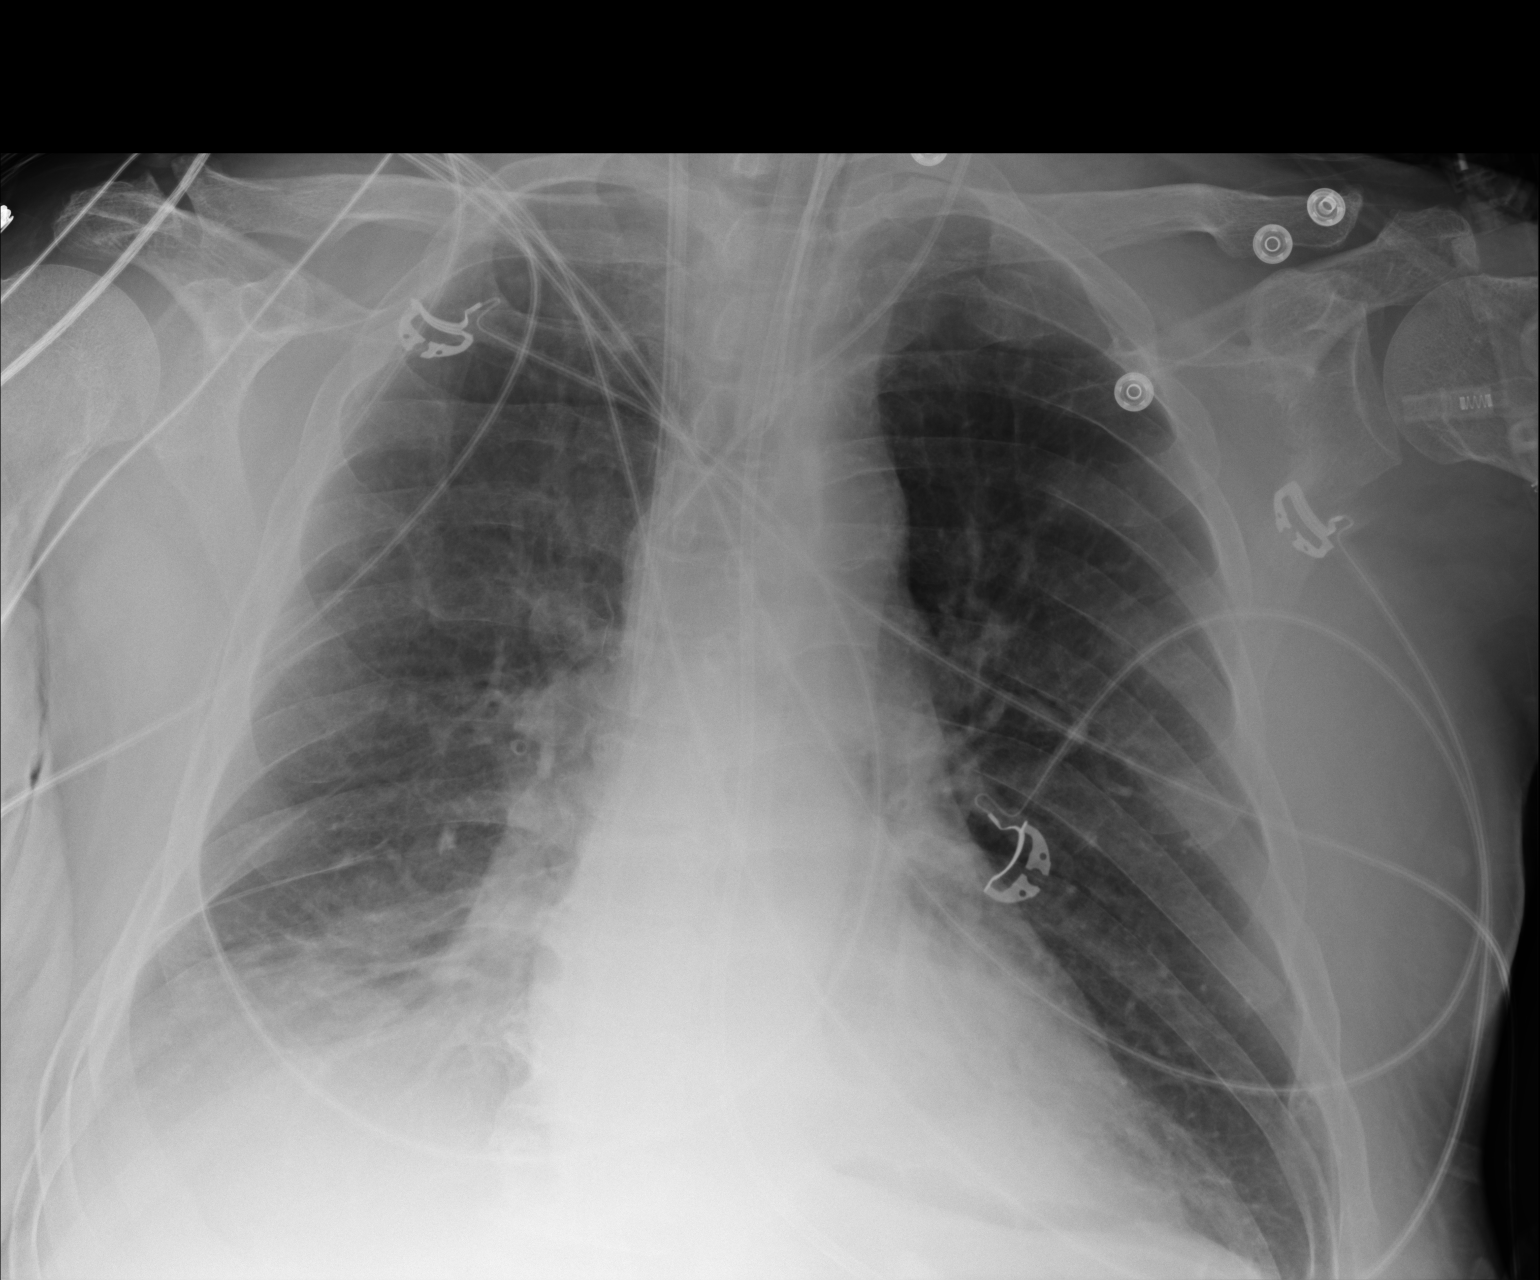

[1 of 1 positions shown; findings below may reference images not displayed]

FINDINGS: Tubular devices are stable. Low volumes. Bibasilar atelectasis
improved. Normal heart size. No pneumothorax.
IMPRESSION: Improved bibasilar atelectasis.
